# Patient Record
Sex: Female | Born: 1960
Health system: Southern US, Community
[De-identification: ages and names within clinical notes are randomized; demographics above are authoritative.]

## PROBLEM LIST (undated history)

## (undated) DIAGNOSIS — E785 Hyperlipidemia, unspecified: Secondary | ICD-10-CM

## (undated) DIAGNOSIS — K649 Unspecified hemorrhoids: Secondary | ICD-10-CM

## (undated) DIAGNOSIS — M719 Bursopathy, unspecified: Secondary | ICD-10-CM

## (undated) DIAGNOSIS — B977 Papillomavirus as the cause of diseases classified elsewhere: Secondary | ICD-10-CM

## (undated) DIAGNOSIS — K219 Gastro-esophageal reflux disease without esophagitis: Secondary | ICD-10-CM

## (undated) DIAGNOSIS — I10 Essential (primary) hypertension: Secondary | ICD-10-CM

## (undated) DIAGNOSIS — M1990 Unspecified osteoarthritis, unspecified site: Secondary | ICD-10-CM

## (undated) HISTORY — DX: Hyperlipidemia, unspecified: E78.5

## (undated) HISTORY — DX: Bursopathy, unspecified: M71.9

## (undated) HISTORY — PX: TONSILLECTOMY: SUR1361

## (undated) HISTORY — DX: Gastro-esophageal reflux disease without esophagitis: K21.9

## (undated) HISTORY — DX: Papillomavirus as the cause of diseases classified elsewhere: B97.7

## (undated) HISTORY — DX: Unspecified hemorrhoids: K64.9

## (undated) HISTORY — PX: TUBAL LIGATION: SHX77

## (undated) HISTORY — DX: Essential (primary) hypertension: I10

---

## 2006-03-18 ENCOUNTER — Ambulatory Visit: Payer: Self-pay | Admitting: Unknown Physician Specialty

## 2006-12-03 ENCOUNTER — Ambulatory Visit: Payer: Self-pay | Admitting: Unknown Physician Specialty

## 2008-03-22 ENCOUNTER — Ambulatory Visit: Payer: Self-pay | Admitting: Unknown Physician Specialty

## 2009-03-29 ENCOUNTER — Ambulatory Visit: Payer: Self-pay | Admitting: Unknown Physician Specialty

## 2009-11-24 DIAGNOSIS — K649 Unspecified hemorrhoids: Secondary | ICD-10-CM

## 2009-11-24 HISTORY — PX: COLONOSCOPY: SHX174

## 2009-11-24 HISTORY — PX: OTHER SURGICAL HISTORY: SHX169

## 2009-11-24 HISTORY — DX: Unspecified hemorrhoids: K64.9

## 2010-04-01 ENCOUNTER — Ambulatory Visit: Payer: Self-pay | Admitting: Unknown Physician Specialty

## 2011-04-03 ENCOUNTER — Ambulatory Visit: Payer: Self-pay | Admitting: Unknown Physician Specialty

## 2012-04-01 ENCOUNTER — Ambulatory Visit: Payer: Self-pay | Admitting: Obstetrics and Gynecology

## 2013-07-21 ENCOUNTER — Ambulatory Visit (INDEPENDENT_AMBULATORY_CARE_PROVIDER_SITE_OTHER): Payer: BC Managed Care – PPO | Admitting: General Surgery

## 2013-07-21 ENCOUNTER — Encounter: Payer: Self-pay | Admitting: General Surgery

## 2013-07-21 VITALS — BP 140/82 | HR 76 | Resp 14 | Ht 65.0 in | Wt 254.0 lb

## 2013-07-21 DIAGNOSIS — K603 Anal fistula, unspecified: Secondary | ICD-10-CM

## 2013-07-21 DIAGNOSIS — K6289 Other specified diseases of anus and rectum: Secondary | ICD-10-CM

## 2013-07-21 HISTORY — DX: Anal fistula, unspecified: K60.30

## 2013-07-21 HISTORY — DX: Anal fistula: K60.3

## 2013-07-21 NOTE — Patient Instructions (Addendum)
Patient to be schedule for surgery.  Patient's surgery has been scheduled for 07-29-13 at [facility].

## 2013-07-21 NOTE — Progress Notes (Signed)
Patient ID: Toni Kelly, female   DOB: 12-26-1960, 52 y.o.   MRN: [id number]  Chief Complaint  Patient presents with  . Other    anal fistula     HPI Toni Kelly is a 52 y.o. female here today for an evaluation of anal fistula .Patient states been there for about six months. She states she is having a lot of pain in her rectal area. Patient had her hemorrhoids banding three years ago. HPI  Past Medical History  Diagnosis Date  . Hyperlipidemia   . Hemorrhoids 2011    Past Surgical History  Procedure Laterality Date  . Tonsillectomy    . Abdominal hysterectomy    . Hemorrhoids banding  2011  . Colonoscopy      History reviewed. No pertinent family history.  Social History History  Substance Use Topics  . Smoking status: Never Smoker   . Smokeless tobacco: Never Used  . Alcohol Use: Yes    Allergies  Allergen Reactions  . Codeine Itching  . Ultram [Tramadol] Itching    Current Outpatient Prescriptions  Medication Sig Dispense Refill  . hydrochlorothiazide (HYDRODIURIL) 25 MG tablet Take 25 mg by mouth daily.       Marland Kitchen ibuprofen (ADVIL,MOTRIN) 800 MG tablet Take by mouth every 6 (six) hours as needed.       . simvastatin (ZOCOR) 20 MG tablet Take 20 mg by mouth daily.        No current facility-administered medications for this visit.    Review of Systems Review of Systems  Constitutional: Negative.   Respiratory: Negative.   Cardiovascular: Negative.     Blood pressure 140/82, pulse 76, resp. rate 14, height 5\' 5"  (1.651 m), weight 254 lb (115.214 kg).  Physical Exam Physical Exam  Constitutional: She is oriented to person, place, and time. She appears well-developed and well-nourished.  Eyes: Conjunctivae are normal. No scleral icterus.  Neck: Neck supple. Mass present. No thyromegaly present.  Cardiovascular: Normal rate, regular rhythm and normal heart sounds.   Pulmonary/Chest: Breath sounds normal.  Abdominal: Soft. Normal appearance and bowel sounds  are normal. No hernia.  Genitourinary:   Opening at 2 o'clock perianal area with a ridge palpable  going up to the anal canal. Skin tag anterior   1.5cm and fibrotic   Lymphadenopathy:    She has no cervical adenopathy.  Neurological: She is alert and oriented to person, place, and time.  Skin: Skin is warm and dry.    Data Reviewed None'   Assessment     Anal fistula. Possible anal fissure anterior.  Plan   Recommend exam under anesthesia, anal fistulotomy or closure with fibrin product, possible fissuere treatment. She is agreeable.    Patient's surgery has been scheduled for 07-29-13 at [facility].    SANKAR,SEEPLAPUTHUR G 07/22/2013, 8:19 AM

## 2013-07-22 ENCOUNTER — Encounter: Payer: Self-pay | Admitting: General Surgery

## 2013-07-27 ENCOUNTER — Telehealth: Payer: Self-pay | Admitting: *Deleted

## 2013-07-27 NOTE — Telephone Encounter (Signed)
Patient called to cancel surgery that was scheduled for 07-29-13 at [facility]. She states she cannot get off of work. Leah in the O.R. notified of cancellation. Patient instructed to call the office once she is ready to reschedule.

## 2013-12-25 ENCOUNTER — Ambulatory Visit: Payer: Self-pay | Admitting: Internal Medicine

## 2013-12-25 LAB — RAPID STREP-A WITH REFLX: Micro Text Report: NEGATIVE

## 2013-12-27 LAB — BETA STREP CULTURE(ARMC)

## 2014-05-24 ENCOUNTER — Ambulatory Visit: Payer: Self-pay | Admitting: Obstetrics and Gynecology

## 2014-09-25 ENCOUNTER — Encounter: Payer: Self-pay | Admitting: General Surgery

## 2015-05-15 ENCOUNTER — Other Ambulatory Visit: Payer: Self-pay | Admitting: Obstetrics and Gynecology

## 2015-05-15 DIAGNOSIS — Z1231 Encounter for screening mammogram for malignant neoplasm of breast: Secondary | ICD-10-CM

## 2015-05-31 ENCOUNTER — Ambulatory Visit
Admission: RE | Admit: 2015-05-31 | Discharge: 2015-05-31 | Disposition: A | Discharge status: Home / Self Care | Payer: BLUE CROSS/BLUE SHIELD | Source: Ambulatory Visit | Attending: Obstetrics and Gynecology | Admitting: Obstetrics and Gynecology

## 2015-05-31 DIAGNOSIS — Z1231 Encounter for screening mammogram for malignant neoplasm of breast: Secondary | ICD-10-CM | POA: Diagnosis not present

## 2015-09-28 ENCOUNTER — Encounter: Payer: Self-pay | Admitting: Internal Medicine

## 2015-09-28 ENCOUNTER — Other Ambulatory Visit: Payer: Self-pay | Admitting: Internal Medicine

## 2015-09-28 ENCOUNTER — Ambulatory Visit (INDEPENDENT_AMBULATORY_CARE_PROVIDER_SITE_OTHER): Payer: BLUE CROSS/BLUE SHIELD | Admitting: Internal Medicine

## 2015-09-28 VITALS — BP 122/98 | HR 86 | Temp 97.9°F | Ht 64.0 in | Wt 274.0 lb

## 2015-09-28 DIAGNOSIS — R609 Edema, unspecified: Secondary | ICD-10-CM | POA: Insufficient documentation

## 2015-09-28 DIAGNOSIS — H65191 Other acute nonsuppurative otitis media, right ear: Secondary | ICD-10-CM

## 2015-09-28 DIAGNOSIS — E785 Hyperlipidemia, unspecified: Secondary | ICD-10-CM

## 2015-09-28 DIAGNOSIS — R600 Localized edema: Secondary | ICD-10-CM

## 2015-09-28 DIAGNOSIS — E782 Mixed hyperlipidemia: Secondary | ICD-10-CM | POA: Insufficient documentation

## 2015-09-28 MED ORDER — AZITHROMYCIN 250 MG PO TABS: ORAL_TABLET | ORAL | Status: DC

## 2015-09-28 NOTE — Progress Notes (Signed)
Date:  09/28/2015   Name:  Toni Kelly   DOB:  Oct 18, 1961   MRN:  [id number]   Chief Complaint: Ear Pain Otalgia  There is pain in the right ear. This is a new problem. The current episode started yesterday. The problem occurs constantly. The problem has been unchanged. There has been no fever. The pain is mild. Pertinent negatives include no coughing, ear discharge, headaches, hearing loss, rash, rhinorrhea or sore throat. She has tried ear drops for the symptoms. The treatment provided no relief.    Review of Systems  Constitutional: Negative for fever and fatigue.  HENT: Positive for ear pain. Negative for ear discharge, hearing loss, postnasal drip, rhinorrhea, sinus pressure and sore throat.   Respiratory: Negative for cough and wheezing.   Cardiovascular: Negative for chest pain and palpitations.  Skin: Negative for rash.  Neurological: Negative for headaches.  Hematological: Negative for adenopathy.    Patient Active Problem List   Diagnosis Date Noted  . Hyperlipidemia 09/28/2015  . Edema 09/28/2015  . Anal fistula 07/21/2013    Prior to Admission medications   Medication Sig Start Date End Date Taking? Authorizing Provider  FLUARIX QUADRIVALENT 0.5 ML injection TO BE ADMINISTERED BY PHARMACIST FOR IMMUNIZATION 09/09/15  Yes Historical Provider, MD  hydrochlorothiazide (HYDRODIURIL) 25 MG tablet Take 25 mg by mouth daily.  06/29/13  Yes Historical Provider, MD  simvastatin (ZOCOR) 20 MG tablet Take 20 mg by mouth daily.  06/29/13  Yes Historical Provider, MD  ibuprofen (ADVIL,MOTRIN) 800 MG tablet Take by mouth every 6 (six) hours as needed.  04/14/13   Historical Provider, MD    Allergies  Allergen Reactions  . Codeine Itching  . Ultram [Tramadol] Itching    Past Surgical History  Procedure Laterality Date  . Tonsillectomy    . Abdominal hysterectomy    . Hemorrhoids banding  2011  . Colonoscopy  2011    @ [facility]    Social History  Substance Use Topics  .  Smoking status: Never Smoker   . Smokeless tobacco: Never Used  . Alcohol Use: Yes    Medication list has been reviewed and updated.   Physical Exam  Constitutional: She appears well-developed and well-nourished.  HENT:  Right Ear: No drainage. Tympanic membrane is retracted (drum dull). Tympanic membrane is not erythematous. No middle ear effusion.  Left Ear: Tympanic membrane is not erythematous and not retracted.  Nose: Right sinus exhibits no maxillary sinus tenderness and no frontal sinus tenderness. Left sinus exhibits no maxillary sinus tenderness and no frontal sinus tenderness.  Mouth/Throat: Oropharynx is clear and moist. No posterior oropharyngeal erythema.  Cardiovascular: Normal rate, regular rhythm and normal heart sounds.   Pulmonary/Chest: Effort normal and breath sounds normal.  Nursing note and vitals reviewed.   BP 122/98 mmHg  Pulse 86  Temp(Src) 97.9 F (36.6 C) (Oral)  Ht 5\' 4"  (1.626 m)  Wt 274 lb (124.286 kg)  BMI 47.01 kg/m2  Assessment and Plan: 1. Other acute nonsuppurative otitis media of right ear Use Sudafed 30 mg twice a day for congestion. Tylenol or ibuprofen for pain - azithromycin (ZITHROMAX Z-PAK) 250 MG tablet; 2 tabs one day #1 then 1 tab daily for 4 more days.  Dispense: 6 each; Refill: 0   Halina Maidens, MD [facility]  09/28/2015

## 2015-09-28 NOTE — Patient Instructions (Signed)
Sudafed 30 mg - take one in the morning and one at mid - day.

## 2015-10-31 ENCOUNTER — Ambulatory Visit (INDEPENDENT_AMBULATORY_CARE_PROVIDER_SITE_OTHER): Payer: BLUE CROSS/BLUE SHIELD | Admitting: Internal Medicine

## 2015-10-31 ENCOUNTER — Encounter: Payer: Self-pay | Admitting: Internal Medicine

## 2015-10-31 VITALS — BP 110/60 | HR 76 | Ht 64.0 in | Wt 272.4 lb

## 2015-10-31 DIAGNOSIS — E785 Hyperlipidemia, unspecified: Secondary | ICD-10-CM | POA: Diagnosis not present

## 2015-10-31 DIAGNOSIS — M778 Other enthesopathies, not elsewhere classified: Secondary | ICD-10-CM | POA: Insufficient documentation

## 2015-10-31 DIAGNOSIS — R600 Localized edema: Secondary | ICD-10-CM | POA: Diagnosis not present

## 2015-10-31 DIAGNOSIS — M659 Synovitis and tenosynovitis, unspecified: Secondary | ICD-10-CM

## 2015-10-31 HISTORY — DX: Other enthesopathies, not elsewhere classified: M77.8

## 2015-10-31 MED ORDER — SIMVASTATIN 20 MG PO TABS: 20.0000 mg | ORAL_TABLET | Freq: Every day | ORAL | Status: DC

## 2015-10-31 MED ORDER — HYDROCHLOROTHIAZIDE 25 MG PO TABS: 25.0000 mg | ORAL_TABLET | Freq: Every day | ORAL | Status: DC

## 2015-10-31 NOTE — Progress Notes (Signed)
Date:  10/31/2015   Name:  Toni Kelly   DOB:  [birth year]   MRN:  [id number]   Chief Complaint: Hypertension and Hyperlipidemia Hyperlipidemia This is a chronic problem. The current episode started more than 1 year ago. The problem is controlled. Recent lipid tests were reviewed and are normal. Pertinent negatives include no myalgias. The current treatment provides significant improvement of lipids.  Elbow pain - has a known bone spur on the right elbow.  She gets cortisone injections about once a year.  She takes ibuprofen as needed. Edema - mild LE edema well controlled with a diuretic daily.  She tries to follow a low salt diet.  Review of Systems  Constitutional: Negative for fever, chills and fatigue.  HENT: Negative for hearing loss.   Eyes: Negative for visual disturbance.  Respiratory: Negative for chest tightness and wheezing.   Cardiovascular: Negative for leg swelling.  Genitourinary: Negative for urgency and flank pain.  Musculoskeletal: Positive for arthralgias. Negative for myalgias and gait problem.  Skin: Negative for rash.  Neurological: Negative for dizziness.    Patient Active Problem List   Diagnosis Date Noted  . Hyperlipidemia 09/28/2015  . Edema 09/28/2015  . Anal fistula 07/21/2013    Prior to Admission medications   Medication Sig Start Date End Date Taking? Authorizing Provider  hydrochlorothiazide (HYDRODIURIL) 25 MG tablet Take 25 mg by mouth daily.  06/29/13  Yes Historical Provider, MD  ibuprofen (ADVIL,MOTRIN) 800 MG tablet Take by mouth every 6 (six) hours as needed.  04/14/13  Yes Historical Provider, MD  simvastatin (ZOCOR) 20 MG tablet Take 20 mg by mouth daily.  06/29/13  Yes Historical Provider, MD    Allergies  Allergen Reactions  . Codeine Itching  . Ultram [Tramadol] Itching    Past Surgical History  Procedure Laterality Date  . Tonsillectomy    . Abdominal hysterectomy    . Hemorrhoids banding  2011  . Colonoscopy  2011    @ [facility]     Social History  Substance Use Topics  . Smoking status: Never Smoker   . Smokeless tobacco: Never Used  . Alcohol Use: 0.0 oz/week    0 Standard drinks or equivalent per week    Medication list has been reviewed and updated.   Physical Exam  Constitutional: She is oriented to person, place, and time. She appears well-developed and well-nourished.  Neck: Normal range of motion. Carotid bruit is not present.  Cardiovascular: Normal rate, regular rhythm and normal heart sounds.   Pulmonary/Chest: Effort normal and breath sounds normal.  Abdominal: Soft. Normal appearance and bowel sounds are normal. There is no tenderness. There is no rebound.  Musculoskeletal:       Right elbow: She exhibits no swelling and no effusion. Tenderness found.  Neurological: She is alert and oriented to person, place, and time.  Skin: Skin is warm, dry and intact.  Psychiatric: She has a normal mood and affect. Her speech is normal.    BP 110/60 mmHg  Pulse 76  Ht 5\' 4"  (1.626 m)  Wt 272 lb 6.4 oz (123.56 kg)  BMI 46.73 kg/m2  Assessment and Plan: 1. Localized edema Controlled on hydrochlorothiazide - hydrochlorothiazide (HYDRODIURIL) 25 MG tablet; Take 1 tablet (25 mg total) by mouth daily.  Dispense: 30 tablet; Refill: 12 - Comprehensive metabolic panel - CBC with Differential/Platelet - TSH  2. Hyperlipidemia Continue statin therapy;  will check labs - simvastatin (ZOCOR) 20 MG tablet; Take 1 tablet (20 mg total) by mouth  daily.  Dispense: 30 tablet; Refill: 12 - Lipid panel  3. Elbow tendonitis Followed by orthopedics with periodic cortisone injections   Halina Maidens, MD [facility]  10/31/2015

## 2015-11-01 LAB — COMPREHENSIVE METABOLIC PANEL
ALT: 33 IU/L — ABNORMAL HIGH (ref 0–32)
AST: 30 IU/L (ref 0–40)
Albumin/Globulin Ratio: 1.4 (ref 1.1–2.5)
Albumin: 4.2 g/dL (ref 3.5–5.5)
Alkaline Phosphatase: 80 IU/L (ref 39–117)
BUN/Creatinine Ratio: 15 (ref 9–23)
BUN: 14 mg/dL (ref 6–24)
Bilirubin Total: 0.5 mg/dL (ref 0.0–1.2)
CO2: 23 mmol/L (ref 18–29)
Calcium: 9.5 mg/dL (ref 8.7–10.2)
Chloride: 105 mmol/L (ref 97–106)
Creatinine, Ser: 0.93 mg/dL (ref 0.57–1.00)
GFR calc Af Amer: 80 mL/min/{1.73_m2} (ref >59)
GFR calc non Af Amer: 69 mL/min/{1.73_m2} (ref >59)
Globulin, Total: 2.9 g/dL (ref 1.5–4.5)
Glucose: 90 mg/dL (ref 65–99)
Potassium: 4.6 mmol/L (ref 3.5–5.2)
Sodium: 145 mmol/L — ABNORMAL HIGH (ref 136–144)
Total Protein: 7.1 g/dL (ref 6.0–8.5)

## 2015-11-01 LAB — LIPID PANEL
Chol/HDL Ratio: 2.9 ratio units (ref 0.0–4.4)
Cholesterol, Total: 191 mg/dL (ref 100–199)
HDL: 65 mg/dL (ref >39)
LDL Calculated: 109 mg/dL — ABNORMAL HIGH (ref 0–99)
Triglycerides: 84 mg/dL (ref 0–149)
VLDL Cholesterol Cal: 17 mg/dL (ref 5–40)

## 2015-11-01 LAB — CBC WITH DIFFERENTIAL/PLATELET
Basophils Absolute: 0 10*3/uL (ref 0.0–0.2)
Basos: 1 %
EOS (ABSOLUTE): 0.1 10*3/uL (ref 0.0–0.4)
Eos: 2 %
Hematocrit: 42.8 % (ref 34.0–46.6)
Hemoglobin: 14.1 g/dL (ref 11.1–15.9)
Immature Grans (Abs): 0 10*3/uL (ref 0.0–0.1)
Immature Granulocytes: 0 %
Lymphocytes Absolute: 1.9 10*3/uL (ref 0.7–3.1)
Lymphs: 32 %
MCH: 29.5 pg (ref 26.6–33.0)
MCHC: 32.9 g/dL (ref 31.5–35.7)
MCV: 90 fL (ref 79–97)
Monocytes Absolute: 0.5 10*3/uL (ref 0.1–0.9)
Monocytes: 9 %
Neutrophils Absolute: 3.3 10*3/uL (ref 1.4–7.0)
Neutrophils: 56 %
Platelets: 266 10*3/uL (ref 150–379)
RBC: 4.78 x10E6/uL (ref 3.77–5.28)
RDW: 14.4 % (ref 12.3–15.4)
WBC: 5.9 10*3/uL (ref 3.4–10.8)

## 2015-11-01 LAB — TSH: TSH: 1.26 u[IU]/mL (ref 0.450–4.500)

## 2016-03-31 ENCOUNTER — Other Ambulatory Visit: Payer: Self-pay | Admitting: Internal Medicine

## 2016-03-31 DIAGNOSIS — R600 Localized edema: Secondary | ICD-10-CM

## 2016-03-31 DIAGNOSIS — E785 Hyperlipidemia, unspecified: Secondary | ICD-10-CM

## 2016-03-31 MED ORDER — HYDROCHLOROTHIAZIDE 25 MG PO TABS: 25.0000 mg | ORAL_TABLET | Freq: Every day | ORAL | Status: DC

## 2016-03-31 MED ORDER — SIMVASTATIN 20 MG PO TABS: 20.0000 mg | ORAL_TABLET | Freq: Every day | ORAL | Status: DC

## 2016-04-22 ENCOUNTER — Other Ambulatory Visit: Payer: Self-pay | Admitting: Obstetrics and Gynecology

## 2016-04-22 DIAGNOSIS — Z1231 Encounter for screening mammogram for malignant neoplasm of breast: Secondary | ICD-10-CM

## 2016-05-23 DIAGNOSIS — Z01419 Encounter for gynecological examination (general) (routine) without abnormal findings: Secondary | ICD-10-CM | POA: Diagnosis not present

## 2016-05-23 DIAGNOSIS — R87613 High grade squamous intraepithelial lesion on cytologic smear of cervix (HGSIL): Secondary | ICD-10-CM

## 2016-05-23 DIAGNOSIS — R87612 Low grade squamous intraepithelial lesion on cytologic smear of cervix (LGSIL): Secondary | ICD-10-CM | POA: Diagnosis not present

## 2016-05-23 HISTORY — DX: High grade squamous intraepithelial lesion on cytologic smear of cervix (HGSIL): R87.613

## 2016-06-02 ENCOUNTER — Ambulatory Visit
Admission: RE | Admit: 2016-06-02 | Discharge: 2016-06-02 | Disposition: A | Discharge status: Home / Self Care | Payer: BLUE CROSS/BLUE SHIELD | Source: Ambulatory Visit | Attending: Obstetrics and Gynecology | Admitting: Obstetrics and Gynecology

## 2016-06-02 DIAGNOSIS — Z1231 Encounter for screening mammogram for malignant neoplasm of breast: Secondary | ICD-10-CM

## 2016-06-13 DIAGNOSIS — N72 Inflammatory disease of cervix uteri: Secondary | ICD-10-CM | POA: Diagnosis not present

## 2016-06-13 DIAGNOSIS — R87810 Cervical high risk human papillomavirus (HPV) DNA test positive: Secondary | ICD-10-CM | POA: Diagnosis not present

## 2016-06-13 DIAGNOSIS — R87612 Low grade squamous intraepithelial lesion on cytologic smear of cervix (LGSIL): Secondary | ICD-10-CM | POA: Diagnosis not present

## 2016-07-22 DIAGNOSIS — H40003 Preglaucoma, unspecified, bilateral: Secondary | ICD-10-CM | POA: Diagnosis not present

## 2016-08-16 DIAGNOSIS — Z23 Encounter for immunization: Secondary | ICD-10-CM | POA: Diagnosis not present

## 2016-09-17 ENCOUNTER — Ambulatory Visit: Payer: BLUE CROSS/BLUE SHIELD | Admitting: Internal Medicine

## 2016-09-17 ENCOUNTER — Encounter: Payer: Self-pay | Admitting: Internal Medicine

## 2016-09-17 ENCOUNTER — Ambulatory Visit (INDEPENDENT_AMBULATORY_CARE_PROVIDER_SITE_OTHER): Payer: BLUE CROSS/BLUE SHIELD | Admitting: Internal Medicine

## 2016-09-17 VITALS — BP 124/86 | HR 84 | Temp 98.6°F | Resp 16 | Ht 64.0 in | Wt 276.0 lb

## 2016-09-17 DIAGNOSIS — J0100 Acute maxillary sinusitis, unspecified: Secondary | ICD-10-CM | POA: Diagnosis not present

## 2016-09-17 MED ORDER — AZITHROMYCIN 250 MG PO TABS: ORAL_TABLET | ORAL | 0 refills | Status: DC

## 2016-09-17 NOTE — Patient Instructions (Signed)

## 2016-09-17 NOTE — Progress Notes (Addendum)
Date:  09/17/2016   Name:  Toni Kelly   DOB:  22-Jul-1961   MRN:  [id number]   Chief Complaint: Sinus Problem (1 week-Some ST and headache that just will not go away.) Sinus Problem  This is a new problem. The current episode started in the past 7 days. The problem is unchanged. There has been no fever. Associated symptoms include congestion, headaches, sinus pressure and a sore throat. Pertinent negatives include no chills, coughing, ear pain or shortness of breath. Treatments tried: otc cold medication. The treatment provided mild relief.      Review of Systems  Constitutional: Negative for chills, fatigue and fever.  HENT: Positive for congestion, sinus pressure and sore throat. Negative for ear discharge, ear pain and trouble swallowing.   Eyes: Negative for visual disturbance.  Respiratory: Negative for cough, chest tightness and shortness of breath.   Cardiovascular: Negative for chest pain.  Neurological: Positive for headaches. Negative for dizziness.  Hematological: Negative for adenopathy.    Patient Active Problem List   Diagnosis Date Noted  . Localized edema 10/31/2015  . Elbow tendonitis 10/31/2015  . Hyperlipidemia 09/28/2015  . Anal fistula 07/21/2013    Prior to Admission medications   Medication Sig Start Date End Date Taking? Authorizing Provider  hydrochlorothiazide (HYDRODIURIL) 25 MG tablet Take 1 tablet (25 mg total) by mouth daily. 03/31/16  Yes Glean Hess, MD  ibuprofen (ADVIL,MOTRIN) 800 MG tablet Take by mouth every 6 (six) hours as needed.  04/14/13  Yes Historical Provider, MD  simvastatin (ZOCOR) 20 MG tablet Take 1 tablet (20 mg total) by mouth daily. 03/31/16  Yes Glean Hess, MD  spironolactone (ALDACTONE) 50 MG tablet TAKE 1 TABLET EVERY DAY 03/31/16  Yes Glean Hess, MD    Allergies  Allergen Reactions  . Codeine Itching  . Ultram [Tramadol] Itching    Past Surgical History:  Procedure Laterality Date  . ABDOMINAL  HYSTERECTOMY    . COLONOSCOPY  2011   @ [facility]  . hemorrhoids banding  2011  . TONSILLECTOMY      Social History  Substance Use Topics  . Smoking status: Never Smoker  . Smokeless tobacco: Never Used  . Alcohol use 0.0 oz/week     Medication list has been reviewed and updated.   Physical Exam  Constitutional: She is oriented to person, place, and time. She appears well-developed and well-nourished.  HENT:  Right Ear: External ear normal.  Left Ear: External ear normal.  Nose: Right sinus exhibits maxillary sinus tenderness and frontal sinus tenderness. Left sinus exhibits maxillary sinus tenderness and frontal sinus tenderness.  Mouth/Throat: Uvula is midline and mucous membranes are normal. No oral lesions. Posterior oropharyngeal erythema present. No oropharyngeal exudate.  Obscured by cerumen - the small portion of the TMs visualized appeared normal.  Cardiovascular: Normal rate, regular rhythm and normal heart sounds.   Pulmonary/Chest: Breath sounds normal. She has no wheezes. She has no rales.  Lymphadenopathy:    She has no cervical adenopathy.  Neurological: She is alert and oriented to person, place, and time.    BP 124/86   Pulse 84   Temp 98.6 F (37 C)   Resp 16   Ht 5\' 4"  (1.626 m)   Wt 276 lb (125.2 kg)   SpO2 98%   BMI 47.38 kg/m   Assessment and Plan: 1. Acute non-recurrent maxillary sinusitis Continue otc cold and cough medication as needed - azithromycin (ZITHROMAX Z-PAK) 250 MG tablet; 2 tabs  on day #1 then 1 tab daily for 4 days.  Dispense: 6 each; Refill: 0   Toni Maidens, MD [facility]  09/17/2016

## 2016-12-30 ENCOUNTER — Encounter: Payer: Self-pay | Admitting: Internal Medicine

## 2016-12-30 ENCOUNTER — Ambulatory Visit (INDEPENDENT_AMBULATORY_CARE_PROVIDER_SITE_OTHER): Payer: BLUE CROSS/BLUE SHIELD | Admitting: Internal Medicine

## 2016-12-30 VITALS — BP 122/92 | HR 93 | Temp 98.2°F | Wt 273.0 lb

## 2016-12-30 DIAGNOSIS — J40 Bronchitis, not specified as acute or chronic: Secondary | ICD-10-CM

## 2016-12-30 MED ORDER — CEFDINIR 300 MG PO CAPS: 300.0000 mg | ORAL_CAPSULE | Freq: Two times a day (BID) | ORAL | 0 refills | Status: DC

## 2016-12-30 MED ORDER — BENZONATATE 100 MG PO CAPS: 100.0000 mg | ORAL_CAPSULE | Freq: Three times a day (TID) | ORAL | 0 refills | Status: DC

## 2016-12-30 NOTE — Progress Notes (Signed)
Date:  12/30/2016   Name:  Toni Kelly   DOB:  02-17-1961   MRN:  [id number]   Chief Complaint: Cough (Pt stated coughing with green mucus, headache, chills this morning...) Cough  This is a new problem. The current episode started 1 to 4 weeks ago. The problem has been gradually worsening. The problem occurs every few minutes. The cough is productive of sputum. Associated symptoms include chest pain, chills and ear congestion. Pertinent negatives include no fever, headaches or wheezing.      Review of Systems  Constitutional: Positive for chills. Negative for fever.  Eyes: Negative for visual disturbance.  Respiratory: Positive for cough. Negative for wheezing.   Cardiovascular: Positive for chest pain.  Neurological: Positive for dizziness and tremors. Negative for numbness and headaches.    Patient Active Problem List   Diagnosis Date Noted  . Localized edema 10/31/2015  . Elbow tendonitis 10/31/2015  . Hyperlipidemia 09/28/2015  . Anal fistula 07/21/2013    Prior to Admission medications   Medication Sig Start Date End Date Taking? Authorizing Provider  hydrochlorothiazide (HYDRODIURIL) 25 MG tablet Take 1 tablet (25 mg total) by mouth daily. 03/31/16  Yes Glean Hess, MD  ibuprofen (ADVIL,MOTRIN) 800 MG tablet Take by mouth every 6 (six) hours as needed.  04/14/13  Yes Historical Provider, MD  simvastatin (ZOCOR) 20 MG tablet Take 1 tablet (20 mg total) by mouth daily. 03/31/16  Yes Glean Hess, MD  spironolactone (ALDACTONE) 50 MG tablet TAKE 1 TABLET EVERY DAY 03/31/16  Yes Glean Hess, MD    Allergies  Allergen Reactions  . Codeine Itching  . Ultram [Tramadol] Itching    Past Surgical History:  Procedure Laterality Date  . ABDOMINAL HYSTERECTOMY    . COLONOSCOPY  2011   @ [facility]  . hemorrhoids banding  2011  . TONSILLECTOMY      Social History  Substance Use Topics  . Smoking status: Never Smoker  . Smokeless tobacco: Never Used  . Alcohol  use 0.0 oz/week     Medication list has been reviewed and updated.   Physical Exam  Constitutional: She is oriented to person, place, and time. She appears well-developed. No distress.  HENT:  Head: Normocephalic and atraumatic.  Right Ear: Tympanic membrane and ear canal normal.  Left Ear: Tympanic membrane and ear canal normal.  Nose: Right sinus exhibits no maxillary sinus tenderness. Left sinus exhibits no maxillary sinus tenderness.  Mouth/Throat: Oropharynx is clear and moist.  Cardiovascular: Normal rate, regular rhythm and normal heart sounds.   Pulmonary/Chest: Effort normal. No respiratory distress. She has decreased breath sounds. She has wheezes. She has no rhonchi.  Musculoskeletal: Normal range of motion.  Neurological: She is alert and oriented to person, place, and time.  Skin: Skin is warm and dry. No rash noted.  Psychiatric: She has a normal mood and affect. Her behavior is normal. Thought content normal.  Nursing note and vitals reviewed.   BP (!) 122/92   Pulse 93   Temp 98.2 F (36.8 C)   Wt 273 lb (123.8 kg)   SpO2 94%   BMI 46.86 kg/m   Assessment and Plan: 1. Bronchitis - cefdinir (OMNICEF) 300 MG capsule; Take 1 capsule (300 mg total) by mouth 2 (two) times daily.  Dispense: 20 capsule; Refill: 0 - benzonatate (TESSALON) 100 MG capsule; Take 1 capsule (100 mg total) by mouth 3 (three) times daily.  Dispense: 20 capsule; Refill: 0   Halina Maidens, MD  [facility]  12/30/2016

## 2017-01-16 ENCOUNTER — Telehealth: Payer: Self-pay | Admitting: Internal Medicine

## 2017-01-16 ENCOUNTER — Other Ambulatory Visit: Payer: Self-pay | Admitting: Internal Medicine

## 2017-01-16 MED ORDER — AZITHROMYCIN 250 MG PO TABS: ORAL_TABLET | ORAL | 0 refills | Status: DC

## 2017-01-16 NOTE — Telephone Encounter (Signed)
Z pak sent to pharmacy

## 2017-01-16 NOTE — Telephone Encounter (Signed)
Pt called said she has finished all her meds and still has a cough and coughing up green mucus, pt is asking if we could call something else in for her pt is using [facility] in [place]

## 2017-01-23 DIAGNOSIS — H40003 Preglaucoma, unspecified, bilateral: Secondary | ICD-10-CM | POA: Diagnosis not present

## 2017-02-10 ENCOUNTER — Telehealth: Payer: Self-pay

## 2017-02-10 ENCOUNTER — Ambulatory Visit
Admission: RE | Admit: 2017-02-10 | Discharge: 2017-02-10 | Disposition: A | Discharge status: Home / Self Care | Payer: BLUE CROSS/BLUE SHIELD | Source: Ambulatory Visit | Attending: Internal Medicine | Admitting: Internal Medicine

## 2017-02-10 ENCOUNTER — Encounter: Payer: Self-pay | Admitting: Internal Medicine

## 2017-02-10 ENCOUNTER — Ambulatory Visit (INDEPENDENT_AMBULATORY_CARE_PROVIDER_SITE_OTHER): Payer: BLUE CROSS/BLUE SHIELD | Admitting: Internal Medicine

## 2017-02-10 VITALS — BP 124/68 | HR 78 | Temp 98.1°F

## 2017-02-10 DIAGNOSIS — R05 Cough: Secondary | ICD-10-CM | POA: Insufficient documentation

## 2017-02-10 DIAGNOSIS — R059 Cough, unspecified: Secondary | ICD-10-CM

## 2017-02-10 MED ORDER — LEVOFLOXACIN 500 MG PO TABS: 500.0000 mg | ORAL_TABLET | Freq: Every day | ORAL | 0 refills | Status: DC

## 2017-02-10 NOTE — Progress Notes (Signed)
Date:  02/10/2017   Name:  Toni Kelly   DOB:  1960-11-28   MRN:  [id number]   Chief Complaint: Cough (Cough still not going away. Was dx with bronchitis but after 2 rounds of antibiotics symptoms still not gone.) Cough  This is a chronic problem. The current episode started more than 1 month ago. The problem has been waxing and waning. The cough is productive of sputum. Associated symptoms include shortness of breath. Pertinent negatives include no chest pain, chills, fever, headaches, sore throat or wheezing. There is no history of environmental allergies.      Review of Systems  Constitutional: Negative for chills, fatigue, fever and unexpected weight change.  HENT: Negative for congestion, sinus pain, sinus pressure and sore throat.   Eyes: Negative for visual disturbance.  Respiratory: Positive for cough, chest tightness and shortness of breath. Negative for wheezing.   Cardiovascular: Negative for chest pain and palpitations.  Allergic/Immunologic: Negative for environmental allergies.  Neurological: Negative for dizziness and headaches.    Patient Active Problem List   Diagnosis Date Noted  . Localized edema 10/31/2015  . Elbow tendonitis 10/31/2015  . Hyperlipidemia 09/28/2015  . Anal fistula 07/21/2013    Prior to Admission medications   Medication Sig Start Date End Date Taking? Authorizing Provider  hydrochlorothiazide (HYDRODIURIL) 25 MG tablet Take 1 tablet (25 mg total) by mouth daily. 03/31/16  Yes Glean Hess, MD  ibuprofen (ADVIL,MOTRIN) 800 MG tablet Take by mouth every 6 (six) hours as needed.  04/14/13  Yes Historical Provider, MD  simvastatin (ZOCOR) 20 MG tablet Take 1 tablet (20 mg total) by mouth daily. 03/31/16  Yes Glean Hess, MD  spironolactone (ALDACTONE) 50 MG tablet TAKE 1 TABLET EVERY DAY 03/31/16  Yes Glean Hess, MD    Allergies  Allergen Reactions  . Codeine Itching  . Ultram [Tramadol] Itching    Past Surgical History:    Procedure Laterality Date  . ABDOMINAL HYSTERECTOMY    . COLONOSCOPY  2011   @ [facility]  . hemorrhoids banding  2011  . TONSILLECTOMY      Social History  Substance Use Topics  . Smoking status: Never Smoker  . Smokeless tobacco: Never Used  . Alcohol use 0.0 oz/week     Medication list has been reviewed and updated.   Physical Exam  Constitutional: She is oriented to person, place, and time. She appears well-developed. No distress.  HENT:  Head: Normocephalic and atraumatic.  Neck: Normal range of motion. Neck supple.  Cardiovascular: Normal rate, regular rhythm and normal heart sounds.   Pulmonary/Chest: Effort normal and breath sounds normal. No respiratory distress. She has no wheezes. She has no rales.  Musculoskeletal: Normal range of motion.  Lymphadenopathy:    She has no cervical adenopathy.  Neurological: She is alert and oriented to person, place, and time.  Skin: Skin is warm and dry. No rash noted.  Psychiatric: She has a normal mood and affect. Her behavior is normal. Thought content normal.  Nursing note and vitals reviewed.   BP 124/68 (BP Location: Right Arm, Patient Position: Sitting, Cuff Size: Large)   Pulse 78   SpO2 95%   Assessment and Plan: 1. Cough Rule out interstitial lung disease or atypical infection - CBC with Differential/Platelet - DG Chest 2 View; Future   Meds ordered this encounter  Medications  . levofloxacin (LEVAQUIN) 500 MG tablet    Sig: Take 1 tablet (500 mg total) by mouth daily.  Dispense:  7 tablet    Refill:  0    Halina Maidens, MD [facility]  02/10/2017

## 2017-02-10 NOTE — Telephone Encounter (Signed)
Pt called stating she is still coughing up green/ dark colors. Not getting any better after 2 rounds of antibiotics. Told to call back after lunch to schedule office visit and possible chest XR.

## 2017-02-11 LAB — CBC WITH DIFFERENTIAL/PLATELET
Basophils Absolute: 0 10*3/uL (ref 0.0–0.2)
Basos: 0 %
EOS (ABSOLUTE): 0.3 10*3/uL (ref 0.0–0.4)
Eos: 3 %
Hematocrit: 43.8 % (ref 34.0–46.6)
Hemoglobin: 14 g/dL (ref 11.1–15.9)
Immature Grans (Abs): 0 10*3/uL (ref 0.0–0.1)
Immature Granulocytes: 0 %
Lymphocytes Absolute: 3.5 10*3/uL — ABNORMAL HIGH (ref 0.7–3.1)
Lymphs: 31 %
MCH: 29 pg (ref 26.6–33.0)
MCHC: 32 g/dL (ref 31.5–35.7)
MCV: 91 fL (ref 79–97)
Monocytes Absolute: 0.7 10*3/uL (ref 0.1–0.9)
Monocytes: 7 %
Neutrophils Absolute: 6.7 10*3/uL (ref 1.4–7.0)
Neutrophils: 59 %
Platelets: 301 10*3/uL (ref 150–379)
RBC: 4.82 x10E6/uL (ref 3.77–5.28)
RDW: 14 % (ref 12.3–15.4)
WBC: 11.3 10*3/uL — ABNORMAL HIGH (ref 3.4–10.8)

## 2017-03-12 ENCOUNTER — Other Ambulatory Visit: Payer: Self-pay | Admitting: Obstetrics and Gynecology

## 2017-03-12 DIAGNOSIS — Z1231 Encounter for screening mammogram for malignant neoplasm of breast: Secondary | ICD-10-CM

## 2017-04-21 ENCOUNTER — Other Ambulatory Visit: Payer: Self-pay | Admitting: Internal Medicine

## 2017-04-21 DIAGNOSIS — E785 Hyperlipidemia, unspecified: Secondary | ICD-10-CM

## 2017-05-12 DIAGNOSIS — L700 Acne vulgaris: Secondary | ICD-10-CM | POA: Diagnosis not present

## 2017-05-21 DIAGNOSIS — H43812 Vitreous degeneration, left eye: Secondary | ICD-10-CM | POA: Diagnosis not present

## 2017-05-24 HISTORY — PX: CERVICAL BIOPSY  W/ LOOP ELECTRODE EXCISION: SUR135

## 2017-06-03 ENCOUNTER — Ambulatory Visit
Admission: RE | Admit: 2017-06-03 | Discharge: 2017-06-03 | Disposition: A | Discharge status: Home / Self Care | Payer: BLUE CROSS/BLUE SHIELD | Source: Ambulatory Visit | Attending: Obstetrics and Gynecology | Admitting: Obstetrics and Gynecology

## 2017-06-03 DIAGNOSIS — Z1231 Encounter for screening mammogram for malignant neoplasm of breast: Secondary | ICD-10-CM | POA: Insufficient documentation

## 2017-06-11 ENCOUNTER — Other Ambulatory Visit: Payer: Self-pay | Admitting: Internal Medicine

## 2017-06-11 DIAGNOSIS — R600 Localized edema: Secondary | ICD-10-CM

## 2017-06-15 ENCOUNTER — Encounter: Payer: Self-pay | Admitting: Obstetrics and Gynecology

## 2017-06-15 ENCOUNTER — Ambulatory Visit (INDEPENDENT_AMBULATORY_CARE_PROVIDER_SITE_OTHER): Payer: BLUE CROSS/BLUE SHIELD | Admitting: Obstetrics and Gynecology

## 2017-06-15 DIAGNOSIS — Z1239 Encounter for other screening for malignant neoplasm of breast: Secondary | ICD-10-CM

## 2017-06-15 DIAGNOSIS — Z1231 Encounter for screening mammogram for malignant neoplasm of breast: Secondary | ICD-10-CM | POA: Diagnosis not present

## 2017-06-15 DIAGNOSIS — Z01419 Encounter for gynecological examination (general) (routine) without abnormal findings: Secondary | ICD-10-CM | POA: Diagnosis not present

## 2017-06-15 DIAGNOSIS — R87610 Atypical squamous cells of undetermined significance on cytologic smear of cervix (ASC-US): Secondary | ICD-10-CM | POA: Diagnosis not present

## 2017-06-15 DIAGNOSIS — Z124 Encounter for screening for malignant neoplasm of cervix: Secondary | ICD-10-CM

## 2017-06-15 NOTE — Progress Notes (Signed)
Patient ID: Toni Kelly, female   DOB: 15-Dec-1960, 56 y.o.   MRN: [id number]     Gynecology Annual Exam  PCP: Glean Hess, MD  Chief Complaint:  Chief Complaint  Patient presents with  . Gynecologic Exam    History of Present Illness:Patient is a 56 y.o. G2P2 presents for annual exam. The patient has no complaints today.   LMP: No LMP recorded. Patient has had a hysterectomy. No bleeding   The patient is sexually active. She denies dyspareunia.  The patient does perform self breast exams.  There is no notable family history of breast or ovarian cancer in her family.  The patient wears seatbelts: yes.   The patient has regular exercise: not asked.    The patient denies current symptoms of depression.     Review of Systems: Review of Systems  Constitutional: Negative for chills and fever.  HENT: Negative for congestion.   Respiratory: Negative for cough and shortness of breath.   Cardiovascular: Negative for chest pain and palpitations.  Gastrointestinal: Negative for abdominal pain, constipation, diarrhea, heartburn, nausea and vomiting.  Genitourinary: Negative for dysuria, frequency and urgency.  Skin: Negative for itching and rash.  Neurological: Negative for dizziness and headaches.  Endo/Heme/Allergies: Negative for polydipsia.  Psychiatric/Behavioral: Negative for depression.    Past Medical History:  Past Medical History:  Diagnosis Date  . Hemorrhoids 2011  . Hyperlipidemia   . Hypertension     Past Surgical History:  Past Surgical History:  Procedure Laterality Date  . ABDOMINAL HYSTERECTOMY    . COLONOSCOPY  2011   @ [facility]  . hemorrhoids banding  2011  . TONSILLECTOMY      Gynecologic History:  No LMP recorded. Patient has had a hysterectomy. Last Pap: Results were: 05/23/16 HSIL HPV pos (negative colposcopy)  Last mammogram: 06/03/17 Results were: BI-RAD I Obstetric History: G2P2  Family History:  Family History  Problem Relation Age of  Onset  . Breast cancer Maternal Aunt 66  . Breast cancer Cousin 95  . Heart failure Mother     Social History:  Social History   Social History  . Marital status: Widowed    Spouse name: N/A  . Number of children: N/A  . Years of education: N/A   Occupational History  . Not on file.   Social History Main Topics  . Smoking status: Never Smoker  . Smokeless tobacco: Never Used  . Alcohol use 0.0 oz/week  . Drug use: No  . Sexual activity: Yes   Other Topics Concern  . Not on file   Social History Narrative  . No narrative on file    Allergies:  Allergies  Allergen Reactions  . Codeine Itching  . Ultram [Tramadol] Itching    Medications: Prior to Admission medications   Medication Sig Start Date End Date Taking? Authorizing Provider  hydrochlorothiazide (HYDRODIURIL) 25 MG tablet TAKE 1 TABLET (25 MG TOTAL) BY MOUTH DAILY. 06/11/17  Yes Glean Hess, MD  simvastatin (ZOCOR) 20 MG tablet Take 1 tablet (20 mg total) by mouth daily. 03/31/16  Yes Glean Hess, MD  clindamycin (CLINDAGEL) 1 % gel APPLY TOPICALLY EVERY DAY BEFORE NOON 05/12/17   [provider]  doxycycline (VIBRAMYCIN) 100 MG capsule 1(ONE) CAPSULE(S) ORAL 2(TWO) TIMES A DAY 06/08/17   [provider]  RETIN-A MICRO PUMP 0.08 % GEL Apply every NIGHT AT bedtime AS directed 05/18/17   [provider]    Physical Exam Vitals: Blood pressure 136/90, pulse  82, height 5\' 4"  (1.626 m), weight 269 lb (122 kg).  General: NAD HEENT: normocephalic, anicteric Thyroid: no enlargement, no palpable nodules Pulmonary: No increased work of breathing, CTAB Cardiovascular: RRR, distal pulses 2+ Breast: Breast symmetrical, no tenderness, no palpable nodules or masses, no skin or nipple retraction present, no nipple discharge.  No axillary or supraclavicular lymphadenopathy. Abdomen: NABS, soft, non-tender, non-distended.  Umbilicus without lesions.  No hepatomegaly, splenomegaly or masses  palpable. No evidence of hernia  Genitourinary:  External: Normal external female genitalia.  Normal urethral meatus, normal  Bartholin's and Skene's glands.    Vagina: Normal vaginal mucosa, no evidence of prolapse.    Cervix: Grossly normal in appearance, no bleeding  Uterus: Surgically absent  Adnexa: ovaries non-enlarged, no adnexal masses  Rectal: deferred  Lymphatic: no evidence of inguinal lymphadenopathy Extremities: no edema, erythema, or tenderness Neurologic: Grossly intact Psychiatric: mood appropriate, affect full  Female chaperone present for pelvic and breast  portions of the physical exam    Assessment: 56 y.o. G2P2 routine annual exam  Plan: Problem List Items Addressed This Visit    None    Visit Diagnoses    Screening for malignant neoplasm of cervix       Relevant Orders   PapIG, HPV, rfx 16/18   Breast screening       Relevant Orders   MM DIGITAL SCREENING BILATERAL   Encounter for gynecological examination without abnormal finding       Relevant Orders   PapIG, HPV, rfx 16/18      1) Mammogram - recommend yearly screening mammogram.  Mammogram Is up to date  2) STI screening was not offered  3) ASCCP guidelines and rational discussed.  Patient opts for yearly screening interval  4) Osteoporosis  - per USPTF routine screening DEXA at age 46  5) Routine healthcare maintenance including cholesterol, diabetes screening discussed managed by PCP - last looked at 2016, having health screening at work in August  6) Colonoscopy .  Screening recommended starting at age 10 for average risk individuals, age 25 for individuals deemed at increased risk (including African Americans) and recommended to continue until age 57.  For patient age 41-85 individualized approach is recommended.  Gold standard screening is via colonoscopy, Cologuard screening is an acceptable alternative for patient unwilling or unable to undergo colonoscopy.  "Colorectal cancer screening  for average?risk adults: 2018 guideline update from the American Cancer Society"CA: A Cancer Journal for Clinicians: Apr 22, 2017  - had at age 58 per patient  7) Follow up 1 year for routine annual

## 2017-06-15 NOTE — Patient Instructions (Signed)
Preventive Care 40-64 Years, Female Preventive care refers to lifestyle choices and visits with your health care provider that can promote health and wellness. What does preventive care include?  A yearly physical exam. This is also called an annual well check.  Dental exams once or twice a year.  Routine eye exams. Ask your health care provider how often you should have your eyes checked.  Personal lifestyle choices, including: ? Daily care of your teeth and gums. ? Regular physical activity. ? Eating a healthy diet. ? Avoiding tobacco and drug use. ? Limiting alcohol use. ? Practicing safe sex. ? Taking low-dose aspirin daily starting at age 58. ? Taking vitamin and mineral supplements as recommended by your health care provider. What happens during an annual well check? The services and screenings done by your health care provider during your annual well check will depend on your age, overall health, lifestyle risk factors, and family history of disease. Counseling Your health care provider may ask you questions about your:  Alcohol use.  Tobacco use.  Drug use.  Emotional well-being.  Home and relationship well-being.  Sexual activity.  Eating habits.  Work and work Statistician.  Method of birth control.  Menstrual cycle.  Pregnancy history.  Screening You may have the following tests or measurements:  Height, weight, and BMI.  Blood pressure.  Lipid and cholesterol levels. These may be checked every 5 years, or more frequently if you are over 81 years old.  Skin check.  Lung cancer screening. You may have this screening every year starting at age 78 if you have a 30-pack-year history of smoking and currently smoke or have quit within the past 15 years.  Fecal occult blood test (FOBT) of the stool. You may have this test every year starting at age 65.  Flexible sigmoidoscopy or colonoscopy. You may have a sigmoidoscopy every 5 years or a colonoscopy  every 10 years starting at age 30.  Hepatitis C blood test.  Hepatitis B blood test.  Sexually transmitted disease (STD) testing.  Diabetes screening. This is done by checking your blood sugar (glucose) after you have not eaten for a while (fasting). You may have this done every 1-3 years.  Mammogram. This may be done every 1-2 years. Talk to your health care provider about when you should start having regular mammograms. This may depend on whether you have a family history of breast cancer.  BRCA-related cancer screening. This may be done if you have a family history of breast, ovarian, tubal, or peritoneal cancers.  Pelvic exam and Pap test. This may be done every 3 years starting at age 80. Starting at age 36, this may be done every 5 years if you have a Pap test in combination with an HPV test.  Bone density scan. This is done to screen for osteoporosis. You may have this scan if you are at high risk for osteoporosis.  Discuss your test results, treatment options, and if necessary, the need for more tests with your health care provider. Vaccines Your health care provider may recommend certain vaccines, such as:  Influenza vaccine. This is recommended every year.  Tetanus, diphtheria, and acellular pertussis (Tdap, Td) vaccine. You may need a Td booster every 10 years.  Varicella vaccine. You may need this if you have not been vaccinated.  Zoster vaccine. You may need this after age 5.  Measles, mumps, and rubella (MMR) vaccine. You may need at least one dose of MMR if you were born in  1957 or later. You may also need a second dose.  Pneumococcal 13-valent conjugate (PCV13) vaccine. You may need this if you have certain conditions and were not previously vaccinated.  Pneumococcal polysaccharide (PPSV23) vaccine. You may need one or two doses if you smoke cigarettes or if you have certain conditions.  Meningococcal vaccine. You may need this if you have certain  conditions.  Hepatitis A vaccine. You may need this if you have certain conditions or if you travel or work in places where you may be exposed to hepatitis A.  Hepatitis B vaccine. You may need this if you have certain conditions or if you travel or work in places where you may be exposed to hepatitis B.  Haemophilus influenzae type b (Hib) vaccine. You may need this if you have certain conditions.  Talk to your health care provider about which screenings and vaccines you need and how often you need them. This information is not intended to replace advice given to you by your health care provider. Make sure you discuss any questions you have with your health care provider. Document Released: 12/07/2015 Document Revised: 07/30/2016 Document Reviewed: 09/11/2015 Elsevier Interactive Patient Education  2017 [employer].

## 2017-06-16 ENCOUNTER — Encounter: Payer: Self-pay | Admitting: Obstetrics and Gynecology

## 2017-06-17 DIAGNOSIS — L089 Local infection of the skin and subcutaneous tissue, unspecified: Secondary | ICD-10-CM | POA: Diagnosis not present

## 2017-06-17 LAB — PAPIG, HPV, RFX 16/18
HPV, high-risk: POSITIVE — AB
PAP Smear Comment: 0

## 2017-06-19 ENCOUNTER — Encounter: Payer: Self-pay | Admitting: Obstetrics and Gynecology

## 2017-06-19 ENCOUNTER — Telehealth: Payer: Self-pay | Admitting: Obstetrics and Gynecology

## 2017-06-19 NOTE — Telephone Encounter (Signed)
Pt is calling about her lab result. Please leave detailed message.

## 2017-06-22 ENCOUNTER — Telehealth: Payer: Self-pay | Admitting: Obstetrics and Gynecology

## 2017-06-22 NOTE — Telephone Encounter (Signed)
Please advise 

## 2017-06-22 NOTE — Telephone Encounter (Signed)
Pt is calling to schedule for LEEP procedure . Please advise

## 2017-06-30 DIAGNOSIS — H43812 Vitreous degeneration, left eye: Secondary | ICD-10-CM | POA: Diagnosis not present

## 2017-07-08 ENCOUNTER — Ambulatory Visit: Payer: BLUE CROSS/BLUE SHIELD

## 2017-07-08 ENCOUNTER — Ambulatory Visit: Payer: BLUE CROSS/BLUE SHIELD | Admitting: Obstetrics and Gynecology

## 2017-07-10 ENCOUNTER — Encounter: Payer: Self-pay | Admitting: Obstetrics and Gynecology

## 2017-07-10 ENCOUNTER — Encounter: Payer: Self-pay | Admitting: Internal Medicine

## 2017-07-10 DIAGNOSIS — L700 Acne vulgaris: Secondary | ICD-10-CM | POA: Diagnosis not present

## 2017-07-10 DIAGNOSIS — L089 Local infection of the skin and subcutaneous tissue, unspecified: Secondary | ICD-10-CM | POA: Diagnosis not present

## 2017-07-10 NOTE — Telephone Encounter (Signed)
Pt sent over results from [facility]. Please Advise.

## 2017-07-20 ENCOUNTER — Encounter: Payer: Self-pay | Admitting: Emergency Medicine

## 2017-07-20 ENCOUNTER — Ambulatory Visit
Admission: EM | Admit: 2017-07-20 | Discharge: 2017-07-20 | Disposition: A | Discharge status: Home / Self Care | Payer: BLUE CROSS/BLUE SHIELD | Attending: Family Medicine | Admitting: Family Medicine

## 2017-07-20 ENCOUNTER — Encounter: Payer: Self-pay | Admitting: Obstetrics and Gynecology

## 2017-07-20 DIAGNOSIS — R51 Headache: Secondary | ICD-10-CM

## 2017-07-20 DIAGNOSIS — R519 Headache, unspecified: Secondary | ICD-10-CM

## 2017-07-20 DIAGNOSIS — R509 Fever, unspecified: Secondary | ICD-10-CM | POA: Diagnosis not present

## 2017-07-20 MED ORDER — NAPROXEN 500 MG PO TABS: 500.0000 mg | ORAL_TABLET | Freq: Two times a day (BID) | ORAL | 0 refills | Status: DC

## 2017-07-20 NOTE — ED Triage Notes (Signed)
Patient c/o HA that started this afternoon.  Patient reports fever this evening.  Patient denies any other cold symptoms.

## 2017-07-20 NOTE — ED Provider Notes (Signed)
[facility]    CSN: [id number] Arrival date & time: 07/20/17  1933     History   Chief Complaint Chief Complaint  Patient presents with  . Headache    HPI Toni Kelly is a 56 y.o. female.   HPI  This a 56 year old female who at 2:00 this afternoon started to feel a headache and a feverish feeling. She states at home her temperature was 101.4 but in the office was 99.9. No source for her complaints. He denies any nausea vomiting diarrhea or obstipation urinary tract symptoms coughing sinus symptoms or shortness of breath. She has taken 1 doxycycline. She did not take anything for her headache. States she was too late today to see her primary care and came here       Past Medical History:  Diagnosis Date  . Hemorrhoids 2011  . Hyperlipidemia   . Hypertension     Patient Active Problem List   Diagnosis Date Noted  . Localized edema 10/31/2015  . Elbow tendonitis 10/31/2015  . Hyperlipidemia 09/28/2015  . Anal fistula 07/21/2013    Past Surgical History:  Procedure Laterality Date  . ABDOMINAL HYSTERECTOMY    . COLONOSCOPY  2011   @ [facility]  . hemorrhoids banding  2011  . TONSILLECTOMY      OB History    Gravida Para Term Preterm AB Living   2 2       2    SAB TAB Ectopic Multiple Live Births                  Obstetric Comments   1st Menstrual Cycle:  15 1st Pregnancy:  17       Home Medications    Prior to Admission medications   Medication Sig Start Date End Date Taking? Authorizing Provider  Dapsone (ACZONE) 5 % topical gel Apply topically 2 (two) times daily.   Yes [provider]  omeprazole (PRILOSEC) 20 MG capsule Take 20 mg by mouth daily.   Yes [provider]  hydrochlorothiazide (HYDRODIURIL) 25 MG tablet TAKE 1 TABLET (25 MG TOTAL) BY MOUTH DAILY. 06/11/17   Glean Hess, MD  naproxen (NAPROSYN) 500 MG tablet Take 1 tablet (500 mg total) by mouth 2 (two) times daily. 07/20/17   Lorin Picket, PA-C    simvastatin (ZOCOR) 20 MG tablet Take 1 tablet (20 mg total) by mouth daily. 03/31/16   Glean Hess, MD    Family History Family History  Problem Relation Age of Onset  . Breast cancer Maternal Aunt 75  . Breast cancer Cousin 69  . Heart failure Mother     Social History Social History  Substance Use Topics  . Smoking status: Never Smoker  . Smokeless tobacco: Never Used  . Alcohol use 0.0 oz/week     Allergies   Codeine and Ultram [tramadol]   Review of Systems Review of Systems  Constitutional: Positive for activity change. Negative for chills, fatigue and fever.  HENT: Negative for congestion, postnasal drip, rhinorrhea, sinus pain, sinus pressure and sneezing.   All other systems reviewed and are negative.    Physical Exam Triage Vital Signs ED Triage Vitals  Enc Vitals Group     BP 07/20/17 1940 129/81     Pulse Rate 07/20/17 1940 (!) 103     Resp 07/20/17 1940 16     Temp 07/20/17 1940 99.9 F (37.7 C)     Temp Source 07/20/17 1940 Oral     SpO2  07/20/17 1940 97 %     Weight 07/20/17 1938 270 lb (122.5 kg)     Height 07/20/17 1938 5\' 4"  (1.626 m)     Head Circumference --      Peak Flow --      Pain Score 07/20/17 1938 4     Pain Loc --      Pain Edu? --      Excl. in [place]? --    No data found.   Updated Vital Signs BP 129/81 (BP Location: Right Arm)   Pulse (!) 103   Temp 99.9 F (37.7 C) (Oral)   Resp 16   Ht 5\' 4"  (1.626 m)   Wt 270 lb (122.5 kg)   SpO2 97%   BMI 46.35 kg/m   Visual Acuity Right Eye Distance:   Left Eye Distance:   Bilateral Distance:    Right Eye Near:   Left Eye Near:    Bilateral Near:     Physical Exam  Constitutional: She is oriented to person, place, and time. She appears well-developed and well-nourished. No distress.  HENT:  Head: Normocephalic.  Right Ear: External ear normal.  Left Ear: External ear normal.  Nose: Nose normal.  Mouth/Throat: Oropharynx is clear and moist. No oropharyngeal  exudate.  Eyes: Pupils are equal, round, and reactive to light. Right eye exhibits no discharge. Left eye exhibits no discharge.  Neck: Normal range of motion.  Pulmonary/Chest: Effort normal and breath sounds normal.  Musculoskeletal: Normal range of motion.  Lymphadenopathy:    She has no cervical adenopathy.  Neurological: She is alert and oriented to person, place, and time.  Skin: Skin is warm and dry. She is not diaphoretic.  Psychiatric: She has a normal mood and affect. Her behavior is normal. Judgment and thought content normal.  Nursing note and vitals reviewed.    UC Treatments / Results  Labs (all labs ordered are listed, but only abnormal results are displayed) Labs Reviewed - No data to display  EKG  EKG Interpretation None       Radiology No results found.  Procedures Procedures (including critical care time)  Medications Ordered in UC Medications - No data to display   Initial Impression / Assessment and Plan / UC Course  I have reviewed the triage vital signs and the nursing notes.  Pertinent labs & imaging results that were available during my care of the patient were reviewed by me and considered in my medical decision making (see chart for details).     Plan: 1. Test/x-ray results and diagnosis reviewed with patient 2. rx as per orders; risks, benefits, potential side effects reviewed with patient 3. Recommend supportive treatment with use of Naprosyn for headache and fever. Take Naprosyn with food. Told her that this is likely a virus and does not need to be treated with doxycycline. She should stop this at this time. His may be early on her course of illness and is likely viral in nature. It'll have to run its course. If she does have any symptoms that localize glad to see her again and reexamine her. She should follow-up with her primary care physician. 4. F/u prn if symptoms worsen or don't improve   Final Clinical Impressions(s) / UC  Diagnoses   Final diagnoses:  Acute nonintractable headache, unspecified headache type  Fever, unspecified    New Prescriptions Discharge Medication List as of 07/20/2017  7:55 PM    START taking these medications   Details  naproxen (NAPROSYN)  500 MG tablet Take 1 tablet (500 mg total) by mouth 2 (two) times daily., Starting Mon 07/20/2017, Normal         Controlled Substance Prescriptions [place] Controlled Substance Registry consulted? Not Applicable   Lorin Picket, PA-C 07/20/17 2007

## 2017-07-31 ENCOUNTER — Ambulatory Visit (INDEPENDENT_AMBULATORY_CARE_PROVIDER_SITE_OTHER): Payer: BLUE CROSS/BLUE SHIELD | Admitting: Obstetrics and Gynecology

## 2017-07-31 ENCOUNTER — Encounter: Payer: Self-pay | Admitting: Obstetrics and Gynecology

## 2017-07-31 VITALS — BP 132/80 | HR 73 | Ht 64.0 in | Wt 264.0 lb

## 2017-07-31 DIAGNOSIS — N72 Inflammatory disease of cervix uteri: Secondary | ICD-10-CM | POA: Diagnosis not present

## 2017-07-31 DIAGNOSIS — R87810 Cervical high risk human papillomavirus (HPV) DNA test positive: Secondary | ICD-10-CM

## 2017-07-31 NOTE — Progress Notes (Signed)
   LEEP PROCEDURE NOTE  The LEEP has been explained to the patient in detail; risks/benefits reviewed.  The risks include, but are not limited to, bleeding, infection, and the possibility of cervical stenosis or cervical incompetence.  The patient had previously been given information regarding abnormal PAP smears and their relationship to HPV.  We have discussed the natural course and history of HPV, the possibility of incomplete treatment by the LEEP, as well as the possibility of recurrence.  I have reviewed the consent form for LEEP with her, and she fully understands its contents.  We have discussed the procedure itself.  The patient has suffered from persistent HPV positive paps smears dating back to 2013.  Colposcopies have been negative.  Given persistance and concern for possible missed pathology on colposcopy biopsies the discussion was had with the patient about proceeding with LEEP to obtain a larger representative biopsy and hopefully facilitate HPV clearance.    I have informed her that following the LEEP she should refrain from intercourse and the use of tampons for three weeks, and that she should also expect some spotting and brown/black discharge over the next several days.  We have discussed the fact that vaginal bleeding, differentiated from spotting, is not normal and that if she should have this complication, she should contact me immediately.  The follow-up after LEEP will be PAP smears or viral typing performed at regular intervals for up to 3-5 years.  Should these all prove to be normal, she will then be back on typical cervical screening.  I have answered all of her questions, and I believe she has an adequate understanding of the LEEP, its implications, and the necessity of follow-up care.  I discussed her colpo results and explained the procedure of LEEP.  All questions were answered and she signed the consent form.    LEEP performed in the usual manner after reviewing the  previous colpo findings and results. Acetic acid solution was usesd to identify any abnormal areas of the cervix.  The cervix was cleansed with betadine solution. Local injection of Lidocaine was performed for anesthesia. Only an ectocervical specimen was obtained given prior [facility] and relatively small cervix with minimal remaining cervical stroma  using the loop electrodes without difficulty.  It was labeled accordingly. Monsel solution was applied.  Malachy Mood, MD 07/31/2017 10:31 AM

## 2017-08-04 ENCOUNTER — Encounter: Payer: Self-pay | Admitting: Obstetrics and Gynecology

## 2017-08-04 LAB — PATHOLOGY

## 2017-08-22 DIAGNOSIS — Z23 Encounter for immunization: Secondary | ICD-10-CM | POA: Diagnosis not present

## 2017-09-14 ENCOUNTER — Ambulatory Visit (INDEPENDENT_AMBULATORY_CARE_PROVIDER_SITE_OTHER): Payer: BLUE CROSS/BLUE SHIELD | Admitting: Obstetrics and Gynecology

## 2017-09-14 ENCOUNTER — Encounter: Payer: Self-pay | Admitting: Obstetrics and Gynecology

## 2017-09-14 VITALS — BP 122/80 | HR 81 | Ht 64.0 in | Wt 269.0 lb

## 2017-09-14 DIAGNOSIS — Z09 Encounter for follow-up examination after completed treatment for conditions other than malignant neoplasm: Secondary | ICD-10-CM

## 2017-09-14 NOTE — Progress Notes (Signed)
      Postoperative Follow-up Patient presents post op from LEEP 6weeks ago for persistently positive HR HPV.  Subjective: Patient reports marked improvement in her preop symptoms. Eating a regular diet without difficulty. The patient is not having any pain.  Activity: normal activities of daily living.  Objective: Vitals:   09/14/17 0825  BP: 122/80  Pulse: 81   Gen: NAD GU: normal external female genitalia.  Well healed leap bed.   Ext: no edema  Assessment: 56 y.o. s/p LEEP stable  Plan: Patient has done well after surgery with no apparent complications.  I have discussed the post-operative course to date, and the expected progress moving forward.  The patient understands what complications to be concerned about.  I will see the patient in routine follow up, or sooner if needed.    Activity plan: No restriction.  Given no changes on LEEP specimen, multiple negative colposcopies in the past follow up in 1 year for next annual and repeat pap  Malachy Mood 09/14/2017, 8:38 AM

## 2017-09-22 DIAGNOSIS — H40003 Preglaucoma, unspecified, bilateral: Secondary | ICD-10-CM | POA: Diagnosis not present

## 2017-09-28 DIAGNOSIS — H40003 Preglaucoma, unspecified, bilateral: Secondary | ICD-10-CM | POA: Diagnosis not present

## 2018-01-10 ENCOUNTER — Other Ambulatory Visit: Payer: Self-pay | Admitting: Internal Medicine

## 2018-01-10 DIAGNOSIS — E785 Hyperlipidemia, unspecified: Secondary | ICD-10-CM

## 2018-01-11 ENCOUNTER — Encounter: Payer: Self-pay | Admitting: Obstetrics and Gynecology

## 2018-01-11 ENCOUNTER — Encounter: Payer: Self-pay | Admitting: Internal Medicine

## 2018-01-14 ENCOUNTER — Encounter: Payer: Self-pay | Admitting: Internal Medicine

## 2018-01-14 ENCOUNTER — Ambulatory Visit (INDEPENDENT_AMBULATORY_CARE_PROVIDER_SITE_OTHER): Payer: BLUE CROSS/BLUE SHIELD | Admitting: Internal Medicine

## 2018-01-14 VITALS — BP 122/68 | HR 85 | Ht 64.0 in | Wt 262.0 lb

## 2018-01-14 DIAGNOSIS — R600 Localized edema: Secondary | ICD-10-CM

## 2018-01-14 DIAGNOSIS — K219 Gastro-esophageal reflux disease without esophagitis: Secondary | ICD-10-CM | POA: Diagnosis not present

## 2018-01-14 DIAGNOSIS — E782 Mixed hyperlipidemia: Secondary | ICD-10-CM

## 2018-01-14 DIAGNOSIS — Z1159 Encounter for screening for other viral diseases: Secondary | ICD-10-CM

## 2018-01-14 MED ORDER — SIMVASTATIN 20 MG PO TABS: 20.0000 mg | ORAL_TABLET | Freq: Every day | ORAL | 3 refills | Status: DC

## 2018-01-14 NOTE — Progress Notes (Signed)
Date:  01/14/2018   Name:  Toni Kelly   DOB:  1961-04-28   MRN:  [id number]   Chief Complaint: Edema; Hyperlipidemia; and Gastroesophageal Reflux Hyperlipidemia  This is a chronic problem. Pertinent negatives include no chest pain or shortness of breath. Current antihyperlipidemic treatment includes statins.  Gastroesophageal Reflux  She complains of heartburn. She reports no abdominal pain, no chest pain, no tooth decay or no wheezing. This is a recurrent problem. The problem occurs rarely. Pertinent negatives include no fatigue.   Edema - controlled with HCTZ 25 mg daily. No issues with blood pressure noted. She tires to follow a low salt diet.  Lab Results  Component Value Date   CHOL 191 10/31/2015   HDL 65 10/31/2015   LDLCALC 109 (H) 10/31/2015   TRIG 84 10/31/2015   CHOLHDL 2.9 10/31/2015   Lab Results  Component Value Date   ALT 33 (H) 10/31/2015   AST 30 10/31/2015   ALKPHOS 80 10/31/2015   BILITOT 0.5 10/31/2015     Review of Systems  Constitutional: Negative for chills, fatigue and fever.  HENT: Negative for trouble swallowing.   Eyes: Negative for visual disturbance.  Respiratory: Negative for chest tightness, shortness of breath and wheezing.   Cardiovascular: Positive for leg swelling. Negative for chest pain and palpitations.  Gastrointestinal: Positive for heartburn. Negative for abdominal pain and blood in stool.  Genitourinary: Negative for difficulty urinating.  Musculoskeletal: Negative for arthralgias.  Allergic/Immunologic: Negative for environmental allergies.  Neurological: Negative for dizziness and headaches.  Psychiatric/Behavioral: Negative for dysphoric mood and sleep disturbance. The patient is not nervous/anxious.     Patient Active Problem List   Diagnosis Date Noted  . Localized edema 10/31/2015  . Elbow tendonitis 10/31/2015  . Mixed hyperlipidemia 09/28/2015  . Anal fistula 07/21/2013    Prior to Admission medications     Medication Sig Start Date End Date Taking? Authorizing Provider  clindamycin (CLINDAGEL) 1 % gel APPLY TOPICALLY EVERY DAY BEFORE NOON 08/27/17   [provider]  Dapsone (ACZONE) 5 % topical gel Apply topically 2 (two) times daily.    [provider]  hydrochlorothiazide (HYDRODIURIL) 25 MG tablet TAKE 1 TABLET (25 MG TOTAL) BY MOUTH DAILY. 06/11/17   Glean Hess, MD  omeprazole (PRILOSEC) 20 MG capsule Take 20 mg by mouth daily.    [provider]  simvastatin (ZOCOR) 20 MG tablet Take 1 tablet (20 mg total) by mouth daily. 03/31/16   Glean Hess, MD  spironolactone (ALDACTONE) 50 MG tablet Take 50 mg by mouth daily. 07/10/17   [provider]    Allergies  Allergen Reactions  . Codeine Itching  . Ultram [Tramadol] Itching    Past Surgical History:  Procedure Laterality Date  . ABDOMINAL HYSTERECTOMY    . CERVICAL BIOPSY  W/ LOOP ELECTRODE EXCISION  05/2017   [facility]  . COLONOSCOPY  2011   @ [facility]  . hemorrhoids banding  2011  . TONSILLECTOMY      Social History   Tobacco Use  . Smoking status: Never Smoker  . Smokeless tobacco: Never Used  Substance Use Topics  . Alcohol use: Yes    Alcohol/week: 0.0 oz  . Drug use: No     Medication list has been reviewed and updated.  PHQ 2/9 Scores 01/14/2018  PHQ - 2 Score 0  PHQ- 9 Score 0    Physical Exam  Constitutional: She is oriented to person, place, and time. She appears well-developed.  No distress.  HENT:  Head: Normocephalic and atraumatic.  Neck: Normal range of motion. Neck supple.  Cardiovascular: Normal rate, regular rhythm and normal heart sounds.  Pulmonary/Chest: Effort normal and breath sounds normal. No respiratory distress. She has no wheezes.  Musculoskeletal: Normal range of motion. She exhibits tenderness (hips).  Neurological: She is alert and oriented to person, place, and time.  Skin: Skin is warm and dry. No rash noted.  Psychiatric: She has a normal  mood and affect. Her speech is normal and behavior is normal. Thought content normal.  Nursing note and vitals reviewed.   BP 122/68   Pulse 85   Ht 5\' 4"  (1.626 m)   Wt 262 lb (118.8 kg)   SpO2 96%   BMI 44.97 kg/m   Assessment and Plan: 1. Localized edema controlled - CBC with Differential/Platelet - TSH  2. Mixed hyperlipidemia On statin therapy - Comprehensive metabolic panel - Lipid panel - simvastatin (ZOCOR) 20 MG tablet; Take 1 tablet (20 mg total) by mouth daily.  Dispense: 90 tablet; Refill: 3  3. Need for hepatitis C screening test - Hepatitis C antibody  4. Gastroesophageal reflux disease, esophagitis presence not specified Controlled with PPI   Meds ordered this encounter  Medications  . simvastatin (ZOCOR) 20 MG tablet    Sig: Take 1 tablet (20 mg total) by mouth daily.    Dispense:  90 tablet    Refill:  3    Partially dictated using Editor, commissioning. Any errors are unintentional.  Halina Maidens, MD [facility]  01/14/2018

## 2018-01-15 LAB — COMPREHENSIVE METABOLIC PANEL
ALT: 31 IU/L (ref 0–32)
AST: 34 IU/L (ref 0–40)
Albumin/Globulin Ratio: 1.3 (ref 1.2–2.2)
Albumin: 4.7 g/dL (ref 3.5–5.5)
Alkaline Phosphatase: 90 IU/L (ref 39–117)
BUN/Creatinine Ratio: 16 (ref 9–23)
BUN: 18 mg/dL (ref 6–24)
Bilirubin Total: 0.6 mg/dL (ref 0.0–1.2)
CO2: 23 mmol/L (ref 20–29)
Calcium: 9.8 mg/dL (ref 8.7–10.2)
Chloride: 100 mmol/L (ref 96–106)
Creatinine, Ser: 1.13 mg/dL — ABNORMAL HIGH (ref 0.57–1.00)
GFR calc Af Amer: 62 mL/min/{1.73_m2} (ref >59)
GFR calc non Af Amer: 54 mL/min/{1.73_m2} — ABNORMAL LOW (ref >59)
Globulin, Total: 3.6 g/dL (ref 1.5–4.5)
Glucose: 95 mg/dL (ref 65–99)
Potassium: 4.4 mmol/L (ref 3.5–5.2)
Sodium: 142 mmol/L (ref 134–144)
Total Protein: 8.3 g/dL (ref 6.0–8.5)

## 2018-01-15 LAB — CBC WITH DIFFERENTIAL/PLATELET
Basophils Absolute: 0 10*3/uL (ref 0.0–0.2)
Basos: 0 %
EOS (ABSOLUTE): 0.2 10*3/uL (ref 0.0–0.4)
Eos: 2 %
Hematocrit: 47.1 % — ABNORMAL HIGH (ref 34.0–46.6)
Hemoglobin: 16.2 g/dL — ABNORMAL HIGH (ref 11.1–15.9)
Immature Grans (Abs): 0 10*3/uL (ref 0.0–0.1)
Immature Granulocytes: 0 %
Lymphocytes Absolute: 2.6 10*3/uL (ref 0.7–3.1)
Lymphs: 28 %
MCH: 31 pg (ref 26.6–33.0)
MCHC: 34.4 g/dL (ref 31.5–35.7)
MCV: 90 fL (ref 79–97)
Monocytes Absolute: 0.8 10*3/uL (ref 0.1–0.9)
Monocytes: 8 %
Neutrophils Absolute: 5.7 10*3/uL (ref 1.4–7.0)
Neutrophils: 62 %
Platelets: 324 10*3/uL (ref 150–379)
RBC: 5.22 x10E6/uL (ref 3.77–5.28)
RDW: 13.8 % (ref 12.3–15.4)
WBC: 9.3 10*3/uL (ref 3.4–10.8)

## 2018-01-15 LAB — TSH: TSH: 1.33 u[IU]/mL (ref 0.450–4.500)

## 2018-01-15 LAB — LIPID PANEL
Chol/HDL Ratio: 3.5 ratio (ref 0.0–4.4)
Cholesterol, Total: 221 mg/dL — ABNORMAL HIGH (ref 100–199)
HDL: 64 mg/dL (ref >39)
LDL Calculated: 127 mg/dL — ABNORMAL HIGH (ref 0–99)
Triglycerides: 150 mg/dL — ABNORMAL HIGH (ref 0–149)
VLDL Cholesterol Cal: 30 mg/dL (ref 5–40)

## 2018-01-15 LAB — HEPATITIS C ANTIBODY: Hep C Virus Ab: <0.1 s/co ratio (ref 0.0–0.9)

## 2018-03-12 ENCOUNTER — Encounter: Payer: Self-pay | Admitting: Internal Medicine

## 2018-03-12 ENCOUNTER — Ambulatory Visit (INDEPENDENT_AMBULATORY_CARE_PROVIDER_SITE_OTHER): Payer: BLUE CROSS/BLUE SHIELD | Admitting: Internal Medicine

## 2018-03-12 VITALS — BP 128/74 | HR 84 | Temp 97.9°F | Ht 64.0 in | Wt 263.0 lb

## 2018-03-12 DIAGNOSIS — J40 Bronchitis, not specified as acute or chronic: Secondary | ICD-10-CM

## 2018-03-12 MED ORDER — AZITHROMYCIN 250 MG PO TABS: ORAL_TABLET | ORAL | 0 refills | Status: DC

## 2018-03-12 NOTE — Patient Instructions (Signed)
Increase Fluids Continue Mucinex DM and guaifenesin Delsym over the counter for cough suppression

## 2018-03-12 NOTE — Progress Notes (Signed)
Date:  03/12/2018   Name:  Toni Kelly   DOB:  01-Feb-1961   MRN:  [id number]   Chief Complaint: Cough (X 3 days Coughing with green production. Head aching. Tried OTC meds but no relief. Mucinex, tylenol sinus... )  Cough  This is a new problem. The current episode started in the past 7 days. The problem has been unchanged. The problem occurs every few hours. The cough is productive of sputum (intermittent green sputum). Pertinent negatives include no chest pain, chills, fever, headaches, hemoptysis, shortness of breath, weight loss or wheezing. She has tried OTC cough suppressant for the symptoms. The treatment provided mild relief. There is no history of asthma or COPD.     Review of Systems  Constitutional: Negative for chills, fever, unexpected weight change and weight loss.  Eyes: Negative for visual disturbance.  Respiratory: Positive for cough. Negative for hemoptysis, shortness of breath and wheezing.   Cardiovascular: Negative for chest pain and palpitations.  Neurological: Negative for dizziness and headaches.    Patient Active Problem List   Diagnosis Date Noted  . Gastroesophageal reflux disease 01/14/2018  . Localized edema 10/31/2015  . Elbow tendonitis 10/31/2015  . Mixed hyperlipidemia 09/28/2015  . Anal fistula 07/21/2013    Prior to Admission medications   Medication Sig Start Date End Date Taking? Authorizing Provider  clindamycin (CLINDAGEL) 1 % gel APPLY TOPICALLY EVERY DAY BEFORE NOON 08/27/17  Yes [provider]  Dapsone (ACZONE) 5 % topical gel Apply topically 2 (two) times daily.   Yes [provider]  hydrochlorothiazide (HYDRODIURIL) 25 MG tablet TAKE 1 TABLET (25 MG TOTAL) BY MOUTH DAILY. 06/11/17  Yes Glean Hess, MD  omeprazole (PRILOSEC) 20 MG capsule Take 20 mg by mouth daily.   Yes [provider]  simvastatin (ZOCOR) 20 MG tablet Take 1 tablet (20 mg total) by mouth daily. 01/14/18  Yes Glean Hess, MD    spironolactone (ALDACTONE) 50 MG tablet Take 50 mg by mouth daily. 07/10/17  Yes [provider]    Allergies  Allergen Reactions  . Codeine Itching  . Ultram [Tramadol] Itching    Past Surgical History:  Procedure Laterality Date  . ABDOMINAL HYSTERECTOMY    . CERVICAL BIOPSY  W/ LOOP ELECTRODE EXCISION  05/2017   [facility]  . COLONOSCOPY  2011   @ [facility]  . hemorrhoids banding  2011  . TONSILLECTOMY      Social History   Tobacco Use  . Smoking status: Never Smoker  . Smokeless tobacco: Never Used  Substance Use Topics  . Alcohol use: Yes    Alcohol/week: 0.0 oz  . Drug use: No     Medication list has been reviewed and updated.  PHQ 2/9 Scores 01/14/2018  PHQ - 2 Score 0  PHQ- 9 Score 0    Physical Exam  Constitutional: She is oriented to person, place, and time. She appears well-developed. No distress.  HENT:  Head: Normocephalic and atraumatic.  Right Ear: Tympanic membrane and ear canal normal.  Left Ear: Tympanic membrane and ear canal normal.  Nose: Right sinus exhibits no maxillary sinus tenderness and no frontal sinus tenderness. Left sinus exhibits no maxillary sinus tenderness and no frontal sinus tenderness.  Mouth/Throat: No posterior oropharyngeal edema or posterior oropharyngeal erythema.  Neck: Normal range of motion. Neck supple.  Cardiovascular: Normal rate, regular rhythm and normal heart sounds.  No murmur heard. Pulmonary/Chest: Effort normal and breath sounds normal. No respiratory distress. She has  no wheezes.  Musculoskeletal: Normal range of motion.  Lymphadenopathy:    She has no cervical adenopathy.  Neurological: She is alert and oriented to person, place, and time.  Skin: Skin is warm and dry. No rash noted.  Psychiatric: She has a normal mood and affect. Her behavior is normal. Thought content normal.    BP 128/74   Pulse 84   Temp 97.9 F (36.6 C) (Oral)   Ht 5\' 4"  (1.626 m)   Wt 263 lb (119.3 kg)   SpO2 93%   BMI  45.14 kg/m   Assessment and Plan: 1. Bronchitis Continue Mucinex DM, begin Delsym Begin Zpak only if sx worsen - azithromycin (ZITHROMAX Z-PAK) 250 MG tablet; UAD  Dispense: 6 each; Refill: 0   Meds ordered this encounter  Medications  . azithromycin (ZITHROMAX Z-PAK) 250 MG tablet    Sig: UAD    Dispense:  6 each    Refill:  0    Partially dictated using Editor, commissioning. Any errors are unintentional.  Halina Maidens, MD [facility]  03/12/2018

## 2018-03-24 ENCOUNTER — Telehealth: Payer: Self-pay | Admitting: Internal Medicine

## 2018-03-24 NOTE — Telephone Encounter (Signed)
10 days will be Friday -Explained this URI has lingering cough last to go. She will call Friday if no better. Also advised to increase fluid intake to help thin the lining of mucus helping cough it up. Agreed.

## 2018-03-24 NOTE — Telephone Encounter (Signed)
Finished antibiotic for 10days need another azithromycin (ZITHROMAX Z-PAK) 250 MG tablet

## 2018-03-29 DIAGNOSIS — H40053 Ocular hypertension, bilateral: Secondary | ICD-10-CM | POA: Diagnosis not present

## 2018-04-20 ENCOUNTER — Other Ambulatory Visit: Payer: Self-pay | Admitting: Obstetrics and Gynecology

## 2018-04-20 DIAGNOSIS — Z1231 Encounter for screening mammogram for malignant neoplasm of breast: Secondary | ICD-10-CM

## 2018-05-07 ENCOUNTER — Encounter: Payer: Self-pay | Admitting: Obstetrics and Gynecology

## 2018-06-06 ENCOUNTER — Other Ambulatory Visit: Payer: Self-pay | Admitting: Internal Medicine

## 2018-06-06 DIAGNOSIS — R600 Localized edema: Secondary | ICD-10-CM

## 2018-06-07 ENCOUNTER — Ambulatory Visit
Admission: RE | Admit: 2018-06-07 | Discharge: 2018-06-07 | Disposition: A | Discharge status: Home / Self Care | Payer: BLUE CROSS/BLUE SHIELD | Source: Ambulatory Visit | Attending: Obstetrics and Gynecology | Admitting: Obstetrics and Gynecology

## 2018-06-07 DIAGNOSIS — Z1231 Encounter for screening mammogram for malignant neoplasm of breast: Secondary | ICD-10-CM | POA: Diagnosis not present

## 2018-06-08 ENCOUNTER — Encounter (INDEPENDENT_AMBULATORY_CARE_PROVIDER_SITE_OTHER): Payer: Self-pay

## 2018-06-09 ENCOUNTER — Encounter: Payer: Self-pay | Admitting: Internal Medicine

## 2018-06-10 ENCOUNTER — Encounter: Payer: Self-pay | Admitting: Internal Medicine

## 2018-06-11 ENCOUNTER — Encounter: Payer: Self-pay | Admitting: Internal Medicine

## 2018-06-11 ENCOUNTER — Ambulatory Visit
Admission: RE | Admit: 2018-06-11 | Discharge: 2018-06-11 | Disposition: A | Discharge status: Home / Self Care | Payer: BLUE CROSS/BLUE SHIELD | Source: Ambulatory Visit | Attending: Internal Medicine | Admitting: Internal Medicine

## 2018-06-11 ENCOUNTER — Ambulatory Visit (INDEPENDENT_AMBULATORY_CARE_PROVIDER_SITE_OTHER): Payer: BLUE CROSS/BLUE SHIELD | Admitting: Internal Medicine

## 2018-06-11 VITALS — BP 122/78 | HR 78 | Temp 97.9°F | Ht 64.0 in | Wt 255.0 lb

## 2018-06-11 DIAGNOSIS — M7061 Trochanteric bursitis, right hip: Secondary | ICD-10-CM | POA: Diagnosis not present

## 2018-06-11 DIAGNOSIS — M7062 Trochanteric bursitis, left hip: Secondary | ICD-10-CM | POA: Diagnosis not present

## 2018-06-11 DIAGNOSIS — K219 Gastro-esophageal reflux disease without esophagitis: Secondary | ICD-10-CM | POA: Diagnosis not present

## 2018-06-11 DIAGNOSIS — R059 Cough, unspecified: Secondary | ICD-10-CM

## 2018-06-11 DIAGNOSIS — R05 Cough: Secondary | ICD-10-CM | POA: Insufficient documentation

## 2018-06-11 MED ORDER — AMOXICILLIN-POT CLAVULANATE 875-125 MG PO TABS: 1.0000 | ORAL_TABLET | Freq: Two times a day (BID) | ORAL | 0 refills | Status: DC

## 2018-06-11 MED ORDER — PREDNISONE 10 MG PO TABS: ORAL_TABLET | ORAL | 0 refills | Status: DC

## 2018-06-11 MED ORDER — OMEPRAZOLE 20 MG PO CPDR: 20.0000 mg | DELAYED_RELEASE_CAPSULE | Freq: Two times a day (BID) | ORAL | 5 refills | Status: DC

## 2018-06-11 NOTE — Progress Notes (Signed)
Date:  06/11/2018   Name:  Toni Kelly   DOB:  1961-06-19   MRN:  [id number]   Chief Complaint: Cough (Coughing up green thick mucous since April. ) and Hip Pain (All night long since about 6 months ago she is having pains on both sides while laying down at night. Has to toss and turn all night. ) Cough  This is a chronic problem. The current episode started more than 1 month ago. The problem has been unchanged. The cough is productive of sputum. Associated symptoms include heartburn and myalgias. Pertinent negatives include no chest pain, chills, fever, headaches or wheezing. Treatments tried: antibiotics - Zpak.  Hip Pain   There was no injury mechanism. The pain is present in the right hip and left hip. The quality of the pain is described as aching. The pain is moderate. The pain has been constant since onset. She has tried ice (and icy hot) for the symptoms.  Gastroesophageal Reflux  She complains of coughing, globus sensation and heartburn. She reports no abdominal pain, no chest pain or no wheezing. This is a new problem. The problem occurs frequently. Pertinent negatives include no fatigue. She has tried a PPI for the symptoms. The treatment provided mild relief. seen by ENT and had scope.      Review of Systems  Constitutional: Negative for chills, fatigue and fever.  Respiratory: Positive for cough. Negative for chest tightness, wheezing and stridor.   Cardiovascular: Negative for chest pain, palpitations and leg swelling.  Gastrointestinal: Positive for heartburn. Negative for abdominal pain.  Musculoskeletal: Positive for arthralgias and myalgias. Negative for gait problem and joint swelling.  Neurological: Negative for dizziness and headaches.    Patient Active Problem List   Diagnosis Date Noted  . Gastroesophageal reflux disease 01/14/2018  . Localized edema 10/31/2015  . Elbow tendonitis 10/31/2015  . Mixed hyperlipidemia 09/28/2015  . Anal fistula 07/21/2013     Prior to Admission medications   Medication Sig Start Date End Date Taking? Authorizing Provider  Dapsone (ACZONE) 5 % topical gel Apply topically 2 (two) times daily.   Yes [provider]  hydrochlorothiazide (HYDRODIURIL) 25 MG tablet TAKE 1 TABLET (25 MG TOTAL) BY MOUTH DAILY. 06/06/18  Yes Glean Hess, MD  omeprazole (PRILOSEC) 20 MG capsule Take 20 mg by mouth daily.   Yes [provider]  simvastatin (ZOCOR) 20 MG tablet Take 1 tablet (20 mg total) by mouth daily. 01/14/18  Yes Glean Hess, MD  spironolactone (ALDACTONE) 50 MG tablet Take 50 mg by mouth daily. 07/10/17  Yes [provider]    Allergies  Allergen Reactions  . Codeine Itching  . Ultram [Tramadol] Itching    Past Surgical History:  Procedure Laterality Date  . ABDOMINAL HYSTERECTOMY    . CERVICAL BIOPSY  W/ LOOP ELECTRODE EXCISION  05/2017   [facility]  . COLONOSCOPY  2011   @ [facility]  . hemorrhoids banding  2011  . TONSILLECTOMY      Social History   Tobacco Use  . Smoking status: Never Smoker  . Smokeless tobacco: Never Used  Substance Use Topics  . Alcohol use: Yes    Alcohol/week: 0.0 oz  . Drug use: No     Medication list has been reviewed and updated.  Current Meds  Medication Sig  . Dapsone (ACZONE) 5 % topical gel Apply topically 2 (two) times daily.  . hydrochlorothiazide (HYDRODIURIL) 25 MG tablet TAKE 1 TABLET (25 MG TOTAL) BY MOUTH  DAILY.  . omeprazole (PRILOSEC) 20 MG capsule Take 20 mg by mouth daily.  . simvastatin (ZOCOR) 20 MG tablet Take 1 tablet (20 mg total) by mouth daily.  Marland Kitchen spironolactone (ALDACTONE) 50 MG tablet Take 50 mg by mouth daily.    PHQ 2/9 Scores 01/14/2018  PHQ - 2 Score 0  PHQ- 9 Score 0    Physical Exam  Constitutional: She is oriented to person, place, and time. She appears well-developed. No distress.  HENT:  Head: Normocephalic and atraumatic.  Neck: Normal range of motion. Neck supple.  Cardiovascular: Normal  rate, regular rhythm and normal heart sounds.  Pulmonary/Chest: Effort normal and breath sounds normal. No respiratory distress. She has no wheezes. She has no rhonchi.  Musculoskeletal: Normal range of motion.  Tender over both lateral hips  Neurological: She is alert and oriented to person, place, and time.  Skin: Skin is warm and dry. No rash noted.  Psychiatric: She has a normal mood and affect. Her behavior is normal. Thought content normal.  Nursing note and vitals reviewed.   BP 122/78   Pulse 78   Temp 97.9 F (36.6 C) (Oral)   Ht 5\' 4"  (1.626 m)   Wt 255 lb (115.7 kg)   SpO2 97%   BMI 43.77 kg/m   Assessment and Plan: 1. Cough - DG Chest 2 View; Future - amoxicillin-clavulanate (AUGMENTIN) 875-125 MG tablet; Take 1 tablet by mouth 2 (two) times daily.  Dispense: 20 tablet; Refill: 0  2. Gastroesophageal reflux disease, esophagitis presence not specified Increase PPI to bid - omeprazole (PRILOSEC) 20 MG capsule; Take 1 capsule (20 mg total) by mouth 2 (two) times daily before a meal.  Dispense: 60 capsule; Refill: 5  3. Trochanteric bursitis of both hips - predniSONE (DELTASONE) 10 MG tablet; Take 6 on day 1, 5 on day 2, 4 on day 3, 3 on day 4, 2 on day 5 and 1 on day 1 then stop.  Dispense: 21 tablet; Refill: 0   Meds ordered this encounter  Medications  . predniSONE (DELTASONE) 10 MG tablet    Sig: Take 6 on day 1, 5 on day 2, 4 on day 3, 3 on day 4, 2 on day 5 and 1 on day 1 then stop.    Dispense:  21 tablet    Refill:  0  . amoxicillin-clavulanate (AUGMENTIN) 875-125 MG tablet    Sig: Take 1 tablet by mouth 2 (two) times daily.    Dispense:  20 tablet    Refill:  0  . omeprazole (PRILOSEC) 20 MG capsule    Sig: Take 1 capsule (20 mg total) by mouth 2 (two) times daily before a meal.    Dispense:  60 capsule    Refill:  5    Partially dictated using Editor, commissioning. Any errors are unintentional.  Halina Maidens, MD [facility]  06/11/2018   There are no diagnoses linked to this encounter.

## 2018-06-12 ENCOUNTER — Encounter: Payer: Self-pay | Admitting: Internal Medicine

## 2018-06-16 ENCOUNTER — Other Ambulatory Visit (HOSPITAL_COMMUNITY)
Admission: RE | Admit: 2018-06-16 | Discharge: 2018-06-16 | Disposition: A | Discharge status: Home / Self Care | Payer: BLUE CROSS/BLUE SHIELD | Source: Ambulatory Visit | Attending: Obstetrics and Gynecology | Admitting: Obstetrics and Gynecology

## 2018-06-16 ENCOUNTER — Ambulatory Visit (INDEPENDENT_AMBULATORY_CARE_PROVIDER_SITE_OTHER): Payer: BLUE CROSS/BLUE SHIELD | Admitting: Obstetrics and Gynecology

## 2018-06-16 ENCOUNTER — Encounter: Payer: Self-pay | Admitting: Obstetrics and Gynecology

## 2018-06-16 VITALS — BP 130/88 | HR 75 | Ht 64.0 in | Wt 255.0 lb

## 2018-06-16 DIAGNOSIS — Z01419 Encounter for gynecological examination (general) (routine) without abnormal findings: Secondary | ICD-10-CM | POA: Diagnosis not present

## 2018-06-16 DIAGNOSIS — Z124 Encounter for screening for malignant neoplasm of cervix: Secondary | ICD-10-CM | POA: Insufficient documentation

## 2018-06-16 DIAGNOSIS — Z1231 Encounter for screening mammogram for malignant neoplasm of breast: Secondary | ICD-10-CM

## 2018-06-16 DIAGNOSIS — Z1239 Encounter for other screening for malignant neoplasm of breast: Secondary | ICD-10-CM

## 2018-06-16 NOTE — Progress Notes (Signed)
Gynecology Annual Exam  PCP: Glean Hess, MD  Chief Complaint:  Chief Complaint  Patient presents with  . Gynecologic Exam    History of Present Illness:Patient is a 57 y.o. G2P2 presents for annual exam. The patient has no complaints today.   LMP: No LMP recorded. Patient has had a hysterectomy.  The patient is sexually active. She denies dyspareunia.  The patient does perform self breast exams.  There is no notable family history of breast or ovarian cancer in her family.  The patient wears seatbelts: yes.   The patient has regular exercise: not asked.    The patient denies current symptoms of depression.     Review of Systems: Review of Systems  Constitutional: Negative.        + vasomotor symptoms  HENT: Negative.   Eyes: Negative.   Respiratory: Positive for cough. Negative for hemoptysis, sputum production, shortness of breath and wheezing.   Cardiovascular: Negative.   Gastrointestinal: Negative.   Genitourinary: Negative.   Musculoskeletal: Positive for joint pain. Negative for back pain, falls, myalgias and neck pain.  Skin: Negative.   Neurological: Negative.   Endo/Heme/Allergies: Negative.   Psychiatric/Behavioral: Negative.     Past Medical History:  Past Medical History:  Diagnosis Date  . Bursitis   . Esophageal reflux   . Hemorrhoids 2011  . Hyperlipidemia   . Hypertension     Past Surgical History:  Past Surgical History:  Procedure Laterality Date  . ABDOMINAL HYSTERECTOMY    . CERVICAL BIOPSY  W/ LOOP ELECTRODE EXCISION  05/2017   [facility]  . COLONOSCOPY  2011   @ [facility]  . hemorrhoids banding  2011  . TONSILLECTOMY      Gynecologic History:  No LMP recorded. Patient has had a hysterectomy. Last Pap: Results were:  07/31/2017 LEEP negative for dysplasia with well defined transformation zone  06/15/2017 ASCUS HPV positive 05/23/2016 [facility] HPV positive (negative colposcopy) Last mammogram: 06/07/18 Results were: Gillian Shields I        Obstetric History: G2P2  Family History:  Family History  Problem Relation Age of Onset  . Breast cancer Maternal Aunt 6  . Breast cancer Cousin 42  . Heart failure Mother     Social History:  Social History   Socioeconomic History  . Marital status: Widowed    Spouse name: Not on file  . Number of children: Not on file  . Years of education: Not on file  . Highest education level: Not on file  Occupational History  . Not on file  Social Needs  . Financial resource strain: Not on file  . Food insecurity:    Worry: Not on file    Inability: Not on file  . Transportation needs:    Medical: Not on file    Non-medical: Not on file  Tobacco Use  . Smoking status: Never Smoker  . Smokeless tobacco: Never Used  Substance and Sexual Activity  . Alcohol use: Yes    Alcohol/week: 0.0 oz  . Drug use: No  . Sexual activity: Yes    Birth control/protection: Surgical  Lifestyle  . Physical activity:    Days per week: Not on file    Minutes per session: Not on file  . Stress: Not on file  Relationships  . Social connections:    Talks on phone: Not on file    Gets together: Not on file    Attends religious service: Not on file    Active member  of club or organization: Not on file    Attends meetings of clubs or organizations: Not on file    Relationship status: Not on file  . Intimate partner violence:    Fear of current or ex partner: Not on file    Emotionally abused: Not on file    Physically abused: Not on file    Forced sexual activity: Not on file  Other Topics Concern  . Not on file  Social History Narrative  . Not on file    Allergies:  Allergies  Allergen Reactions  . Codeine Itching  . Ultram [Tramadol] Itching    Medications: Prior to Admission medications   Medication Sig Start Date End Date Taking? Authorizing Provider  amoxicillin-clavulanate (AUGMENTIN) 875-125 MG tablet Take 1 tablet by mouth 2 (two) times daily. 06/11/18   Glean Hess, MD  Dapsone (ACZONE) 5 % topical gel Apply topically 2 (two) times daily.    [provider]  hydrochlorothiazide (HYDRODIURIL) 25 MG tablet TAKE 1 TABLET (25 MG TOTAL) BY MOUTH DAILY. 06/06/18   Glean Hess, MD  omeprazole (PRILOSEC) 20 MG capsule Take 1 capsule (20 mg total) by mouth 2 (two) times daily before a meal. 06/11/18   Glean Hess, MD  predniSONE (DELTASONE) 10 MG tablet Take 6 on day 1, 5 on day 2, 4 on day 3, 3 on day 4, 2 on day 5 and 1 on day 1 then stop. 06/11/18   Glean Hess, MD  simvastatin (ZOCOR) 20 MG tablet Take 1 tablet (20 mg total) by mouth daily. 01/14/18   Glean Hess, MD  spironolactone (ALDACTONE) 50 MG tablet Take 50 mg by mouth daily. 07/10/17   [provider]    Physical Exam Vitals: Blood pressure 130/88, pulse 75, height 5\' 4"  (1.626 m), weight 255 lb (115.7 kg).  General: NAD HEENT: normocephalic, anicteric Thyroid: no enlargement, no palpable nodules Pulmonary: No increased work of breathing, CTAB Cardiovascular: RRR, distal pulses 2+ Breast: Breast symmetrical, no tenderness, no palpable nodules or masses, no skin or nipple retraction present, no nipple discharge.  No axillary or supraclavicular lymphadenopathy. Abdomen: NABS, soft, non-tender, non-distended.  Umbilicus without lesions.  No hepatomegaly, splenomegaly or masses palpable. No evidence of hernia  Genitourinary:  External: Normal external female genitalia.  Normal urethral meatus, normal Bartholin's and Skene's glands.    Vagina: Normal vaginal mucosa, no evidence of prolapse.    Cervix: Grossly normal in appearance, no bleeding  Uterus: surgically absent  Adnexa: ovaries non-enlarged, no adnexal masses  Rectal: deferred  Lymphatic: no evidence of inguinal lymphadenopathy Extremities: no edema, erythema, or tenderness Neurologic: Grossly intact Psychiatric: mood appropriate, affect full  Female chaperone present for pelvic and breast   portions of the physical exam    Assessment: 57 y.o. G2P2 routine annual exam  Plan: Problem List Items Addressed This Visit    None    Visit Diagnoses    Encounter for gynecological examination without abnormal finding    -  Primary   Breast screening       Screening for malignant neoplasm of cervix       Relevant Orders   Cytology - PAP      1) Mammogram - recommend yearly screening mammogram.  Mammogram Is up to date  2) STI screening  was notoffered and therefore not obtained  3) ASCCP guidelines and rational discussed.  Patient opts for yearly screening interval - if remains abnormal after LEEP last year discussed repeat  colposcopy - She really has very little cervical tissue left but would consider referral to gyn-onc to discuss trachelectomy   4) Osteoporosis  - per USPTF routine screening DEXA at age 3  5) Routine healthcare maintenance including cholesterol, diabetes screening discussed managed by PCP  6) Colonoscopy - next age 40  7) Return in about 1 year (around 06/17/2019) for annual.    Malachy Mood, MD [facility] 06/16/2018, 8:47 AM

## 2018-06-21 ENCOUNTER — Encounter: Payer: Self-pay | Admitting: Obstetrics and Gynecology

## 2018-06-21 DIAGNOSIS — K219 Gastro-esophageal reflux disease without esophagitis: Secondary | ICD-10-CM | POA: Diagnosis not present

## 2018-06-23 ENCOUNTER — Encounter: Payer: Self-pay | Admitting: Gastroenterology

## 2018-06-23 LAB — CYTOLOGY - PAP
Diagnosis: HIGH — AB
HPV 16/18/45 genotyping: NEGATIVE
HPV: DETECTED — AB

## 2018-06-24 ENCOUNTER — Encounter (INDEPENDENT_AMBULATORY_CARE_PROVIDER_SITE_OTHER): Payer: Self-pay

## 2018-06-24 ENCOUNTER — Other Ambulatory Visit: Payer: Self-pay | Admitting: Obstetrics and Gynecology

## 2018-06-24 DIAGNOSIS — R87613 High grade squamous intraepithelial lesion on cytologic smear of cervix (HGSIL): Secondary | ICD-10-CM

## 2018-07-07 ENCOUNTER — Ambulatory Visit: Payer: BLUE CROSS/BLUE SHIELD

## 2018-07-07 ENCOUNTER — Inpatient Hospital Stay: Payer: BLUE CROSS/BLUE SHIELD | Attending: Obstetrics and Gynecology | Admitting: Obstetrics and Gynecology

## 2018-07-07 DIAGNOSIS — Z90710 Acquired absence of both cervix and uterus: Secondary | ICD-10-CM | POA: Insufficient documentation

## 2018-07-07 DIAGNOSIS — R87613 High grade squamous intraepithelial lesion on cytologic smear of cervix (HGSIL): Secondary | ICD-10-CM | POA: Diagnosis not present

## 2018-07-07 NOTE — Progress Notes (Signed)
Pt has HPV in past and can't get rid of it. Had LEEP , no burning, drainage, irritation at all

## 2018-07-07 NOTE — Progress Notes (Signed)
Gynecologic Oncology Consult Visit   Referring Provider: Dr. Georgianne Fick  Chief Complaint: HSIL  Subjective:  Toni Kelly is a 58 y.o. female who is seen in consultation from Dr. Georgianne Fick for HSIL PAP.   Pap results were as follows: 05/23/2016-HSIL HPV positive with negative colposcopy (per Dr. Danielle Rankin note- results not available)  06/15/2017-ASCUS HPV positive.  Positive for high risk HPV.  07/31/2017-LEEP ectocervix.  Pathology revealed chronic cervicitis.  Negative for dysplasia, viral effect, and malignancy.  Transformation zone was not well-defined.  06/16/18- HSIL, HPV positive.  Negative for HPV 16/18/45  Per Dr. Georgianne Fick, minimal amount of cervical tissue remaining after prior hysterectomy and LEEP in 2018.  She has history of laparoscopic supracervical hysterectomy w/ Dr. Davis Gourd for painful menstrual periods in her 49s.    Last mammogram: 06/07/2018 BI-RADS Category 1  Today, she reports overall feeling well. She denies any bleeding or discharge. Denies post-coital bleeding. She does not smoke. She has some chronic pain in legs and back that is managed by PCP.   Problem List: Patient Active Problem List   Diagnosis Date Noted  . Gastroesophageal reflux disease 01/14/2018  . HSIL on Pap smear of cervix 05/23/2016  . Localized edema 10/31/2015  . Elbow tendonitis 10/31/2015  . Mixed hyperlipidemia 09/28/2015  . Anal fistula 07/21/2013    Past Medical History: Past Medical History:  Diagnosis Date  . Bursitis   . Esophageal reflux   . Hemorrhoids 2011  . HPV (human papilloma virus) infection   . Hyperlipidemia   . Hypertension     Past Surgical History: Past Surgical History:  Procedure Laterality Date  . ABDOMINAL HYSTERECTOMY    . CERVICAL BIOPSY  W/ LOOP ELECTRODE EXCISION  05/2017   [facility]  . COLONOSCOPY  2011   @ [facility]  . hemorrhoids banding  2011  . TONSILLECTOMY      Past Gynecologic History:  G2P2 History of abnormal Pap smears: Yes Last  Pap: Per HPI Contraception: Post partial hysterectomy @ ~ age 57 Sexually active: Yes History of STDs: denies  OB History:  OB History  Gravida Para Term Preterm AB Living  2 2       2   SAB TAB Ectopic Multiple Live Births               # Outcome Date GA Lbr Len/2nd Weight Sex Delivery Anes PTL Lv  2 Para           1 Para             Obstetric Comments  1st Menstrual Cycle:  15  1st Pregnancy:  73    Family History: Family History  Problem Relation Age of Onset  . Breast cancer Maternal Aunt 27  . Breast cancer Cousin 38  . Heart failure Mother   . Lung cancer Maternal Uncle   . Lung cancer Maternal Aunt   . Lung cancer Maternal Uncle     Social History: Social History   Socioeconomic History  . Marital status: Widowed    Spouse name: Not on file  . Number of children: Not on file  . Years of education: Not on file  . Highest education level: Not on file  Occupational History  . Not on file  Social Needs  . Financial resource strain: Not on file  . Food insecurity:    Worry: Not on file    Inability: Not on file  . Transportation needs:    Medical: Not on file  Non-medical: Not on file  Tobacco Use  . Smoking status: Never Smoker  . Smokeless tobacco: Never Used  Substance and Sexual Activity  . Alcohol use: Yes    Alcohol/week: 0.0 standard drinks    Comment: rarely  . Drug use: No  . Sexual activity: Yes    Birth control/protection: Surgical  Lifestyle  . Physical activity:    Days per week: Not on file    Minutes per session: Not on file  . Stress: Not on file  Relationships  . Social connections:    Talks on phone: Not on file    Gets together: Not on file    Attends religious service: Not on file    Active member of club or organization: Not on file    Attends meetings of clubs or organizations: Not on file    Relationship status: Not on file  . Intimate partner violence:    Fear of current or ex partner: Not on file    Emotionally  abused: Not on file    Physically abused: Not on file    Forced sexual activity: Not on file  Other Topics Concern  . Not on file  Social History Narrative  . Not on file    Allergies: Allergies  Allergen Reactions  . Codeine Itching  . Ultram [Tramadol] Itching    Current Medications: Current Outpatient Medications  Medication Sig Dispense Refill  . Dapsone (ACZONE) 5 % topical gel Apply topically 2 (two) times daily as needed.     . hydrochlorothiazide (HYDRODIURIL) 25 MG tablet TAKE 1 TABLET (25 MG TOTAL) BY MOUTH DAILY. 90 tablet 2  . Multiple Vitamins-Minerals (WOMENS 50+ MULTI VITAMIN/MIN PO) Take 1 tablet by mouth.    Marland Kitchen omeprazole (PRILOSEC) 20 MG capsule Take 1 capsule (20 mg total) by mouth 2 (two) times daily before a meal. 60 capsule 5  . simvastatin (ZOCOR) 20 MG tablet Take 1 tablet (20 mg total) by mouth daily. 90 tablet 3  . spironolactone (ALDACTONE) 50 MG tablet Take 50 mg by mouth daily.  6  . amoxicillin-clavulanate (AUGMENTIN) 875-125 MG tablet Take 1 tablet by mouth 2 (two) times daily. 20 tablet 0  . predniSONE (DELTASONE) 10 MG tablet Take 6 on day 1, 5 on day 2, 4 on day 3, 3 on day 4, 2 on day 5 and 1 on day 1 then stop. 21 tablet 0   No current facility-administered medications for this visit.    Review of Systems General: negative for fevers, chills, fatigue, changes in sleep, changes in weight or appetite Skin: negative for changes in color, texture, moles or lesions Eyes: negative for changes in vision, pain, diplopia HEENT: negative for change in hearing, pain, discharge, tinnitus, vertigo, voice changes, sore throat, neck masses Breasts: negative for breast lumps Pulmonary: negative for dyspnea, orthopnea, productive cough Cardiac: negative for palpitations, syncope, pain, discomfort, pressure Gastrointestinal: negative for dysphagia, nausea, vomiting, jaundice, pain, constipation, diarrhea, hematemesis, hematochezia Genitourinary/Sexual: negative  for dysuria, discharge, hesitancy, nocturia, retention, stones, infections, STD's, incontinence Ob/Gyn: negative for irregular bleeding, pain Musculoskeletal: negative for swelling, range of motion limitation. Positive for stiffness of back and pain which is chronic.  Hematology: negative for easy bruising, bleeding Neurologic/Psych: negative for headaches, seizures, paralysis, weakness, tremor, change in gait, change in sensation, mood swings, depression, anxiety, change in memory  Objective:  Physical Examination:  BP 134/86 (BP Location: Right Arm)   Pulse 76   Temp 97.6 F (36.4 C) (Tympanic)   Resp 18  Ht 5\' 4"  (1.626 m)   Wt 257 lb 4.8 oz (116.7 kg)   BMI 44.17 kg/m     ECOG Performance Status: 0 - Asymptomatic  GENERAL: Patient is a well appearing female in no acute distress HEENT:  Sclerae anicteric.  Oropharynx clear and moist. No ulcerations or evidence of oropharyngeal candidiasis. Neck is supple.  NODES:  No cervical, supraclavicular, or axillary lymphadenopathy palpated.  LUNGS:  Clear to auscultation bilaterally.  No wheezes or rhonchi. HEART:  Regular rate and rhythm. No murmur appreciated. ABDOMEN:  Soft, nontender.  Positive, normoactive bowel sounds. No organomegaly palpated. MSK:  No focal spinal tenderness to palpation. Full range of motion bilaterally in the upper extremities. EXTREMITIES:  No peripheral edema.   SKIN:  Clear with no obvious rashes or skin changes. No nail dyscrasia. NEURO:  Nonfocal. Well oriented.  Appropriate affect. BREAST: Breasts appear normal, no suspicious masses, no skin or nipple changes or axillary nodes  Pelvic: Exam Chaperoned by NP EGBUS: no lesions Cervix: flush with upper vault Vagina: no lesions, no discharge or bleeding.  The top of the vagina tapers and feels thinner. Bimanual: no masses or tenderness.  Cannot feel  Cervix. Rectovaginal: confirmatory  Colposcopy was performed after informed consent obtained.  Vagina  soaked with acetic acid and there was dimple seen, but no evidence of abnormal epithelium. She tolerated the procedure well.    Assessment:  ARCADIA GORGAS is a 57 y.o. P2 female diagnosed with persistent HSIL PAP, HR HPV s/p negative LEEP 9/18 and multiple colposcopies. She had prior supracervical hysterectomy in remote past for menorrhagia. Unclear where the HSIL cells are arising from.     Plan:   Problem List Items Addressed This Visit      Other   HSIL on Pap smear of cervix   Relevant Orders   MR PELVIS W WO CONTRAST     On exam there appears to be a bit of a sac above the vagina and this may be better defined on MRI.   In view of the persistent HSIL it may be reasonable to resect this structure above the vagina along with some of the upper vagina via an abdominal approach.  Will contact her after reviewing the MRI.  The patient's diagnosis, an outline of the further diagnostic and laboratory studies which will be required, the recommendation for surgery, and alternatives were discussed with her and her accompanying family members.  All questions were answered to their satisfaction.  A total of 40 minutes were spent with the patient/family today; 40% was spent in education, counseling and coordination of care for HSIL PAP.    Mellody Drown, MD   CC:  Glean Hess, [address] [place], [place] [address] [phone]

## 2018-07-15 ENCOUNTER — Other Ambulatory Visit: Payer: Self-pay | Admitting: Nurse Practitioner

## 2018-07-15 DIAGNOSIS — R87613 High grade squamous intraepithelial lesion on cytologic smear of cervix (HGSIL): Secondary | ICD-10-CM

## 2018-07-16 ENCOUNTER — Ambulatory Visit
Admission: RE | Admit: 2018-07-16 | Discharge: 2018-07-16 | Disposition: A | Discharge status: Home / Self Care | Payer: BLUE CROSS/BLUE SHIELD | Source: Ambulatory Visit | Attending: Nurse Practitioner | Admitting: Nurse Practitioner

## 2018-07-16 ENCOUNTER — Other Ambulatory Visit
Admission: RE | Admit: 2018-07-16 | Discharge: 2018-07-16 | Disposition: A | Discharge status: Home / Self Care | Payer: BLUE CROSS/BLUE SHIELD | Source: Ambulatory Visit | Attending: Nurse Practitioner | Admitting: Nurse Practitioner

## 2018-07-16 DIAGNOSIS — R87613 High grade squamous intraepithelial lesion on cytologic smear of cervix (HGSIL): Secondary | ICD-10-CM

## 2018-07-16 DIAGNOSIS — N83292 Other ovarian cyst, left side: Secondary | ICD-10-CM | POA: Diagnosis not present

## 2018-07-16 LAB — CREATININE, SERUM
Creatinine, Ser: 1.05 mg/dL — ABNORMAL HIGH (ref 0.44–1.00)
GFR calc Af Amer: >60 mL/min (ref >60)
GFR calc non Af Amer: 57 mL/min — ABNORMAL LOW (ref >60)

## 2018-07-16 MED ORDER — GADOBENATE DIMEGLUMINE 529 MG/ML IV SOLN
20.0000 mL | Freq: Once | INTRAVENOUS | Status: AC | PRN
  Administered 2018-07-16: 20 mL via INTRAVENOUS

## 2018-07-21 ENCOUNTER — Other Ambulatory Visit: Payer: Self-pay | Admitting: Nurse Practitioner

## 2018-07-21 NOTE — Progress Notes (Signed)
Results of MRI discussed with Dr. Fransisca Connors who recommends Premarin cream applied vaginally every other night at bedtime for next 3 months and then have patient return to clinic for repeat pelvic exam.

## 2018-07-27 ENCOUNTER — Telehealth: Payer: Self-pay | Admitting: Nurse Practitioner

## 2018-07-27 NOTE — Telephone Encounter (Signed)
Call patient to discuss results from recent MRI.  Per Dr. Fransisca Connors, he would like to use short course of Premarin 0.5g intravaginally 3 times a week at night and then have her return to clinic in approximately 3 months for repeat pelvic exam.   I would like to discuss use, risks, benefits, and appropriate application of medication further and have asked for her to return the call.

## 2018-07-28 ENCOUNTER — Other Ambulatory Visit: Payer: Self-pay | Admitting: Nurse Practitioner

## 2018-07-28 MED ORDER — ESTROGENS, CONJUGATED 0.625 MG/GM VA CREA: TOPICAL_CREAM | VAGINAL | 0 refills | Status: DC

## 2018-07-28 NOTE — Progress Notes (Signed)
Prescription for Premarin sent to pharmacy. Per Dr. Fransisca Connors, patient to insert 1 applicator full into vagina at night 3 nights per week. We will have her return to clinic in approximately 3 months for re-evaluation. Scheduling message sent to make follow-up appointment.

## 2018-07-30 DIAGNOSIS — H902 Conductive hearing loss, unspecified: Secondary | ICD-10-CM | POA: Diagnosis not present

## 2018-07-30 DIAGNOSIS — H6122 Impacted cerumen, left ear: Secondary | ICD-10-CM | POA: Diagnosis not present

## 2018-08-04 ENCOUNTER — Telehealth: Payer: Self-pay

## 2018-08-04 NOTE — Telephone Encounter (Signed)
Spoke with Ms Reitz to inform her per Dr Fransisca Connors he would like to see the patient sooner for a recheck. Her appointment has been moved from 12-19 to 08/26/18 at 3:00 pm. The patient was understanding and agreeable to come in sooner.

## 2018-08-24 HISTORY — PX: ABDOMINAL HYSTERECTOMY: SHX81

## 2018-08-25 ENCOUNTER — Inpatient Hospital Stay: Payer: BLUE CROSS/BLUE SHIELD | Attending: Obstetrics and Gynecology | Admitting: Obstetrics and Gynecology

## 2018-08-25 ENCOUNTER — Encounter: Payer: Self-pay | Admitting: Obstetrics and Gynecology

## 2018-08-25 VITALS — BP 136/74 | HR 74 | Temp 98.9°F | Resp 18 | Ht 64.0 in | Wt 257.4 lb

## 2018-08-25 DIAGNOSIS — A630 Anogenital (venereal) warts: Secondary | ICD-10-CM | POA: Insufficient documentation

## 2018-08-25 DIAGNOSIS — Z90710 Acquired absence of both cervix and uterus: Secondary | ICD-10-CM | POA: Diagnosis not present

## 2018-08-25 DIAGNOSIS — R87613 High grade squamous intraepithelial lesion on cytologic smear of cervix (HGSIL): Secondary | ICD-10-CM | POA: Diagnosis not present

## 2018-08-25 NOTE — Progress Notes (Signed)
Gynecologic Oncology Consult Visit   Referring Provider: Dr. Georgianne Fick  Chief Complaint: HSIL PAP  Subjective:  Toni Kelly is a 57 y.o. female who is seen in consultation from Dr. Georgianne Fick for HSIL PAP in patient with supracervical hysterectomy many years ago.   Patient returns today for follow up.  MRI was ordered to look at cervix more closely in view of HSIL PAP, since cervix is not palpable or visible on exam.  MRI 8/23/119 Cervix is within normal limits. Specifically, the fibrocervical stroma is intact. No findings suspicious for cervical cancer on MRI, noting that early lesions (CIN or stage IA) may be occult by imaging. Nabothian cysts. 2.3 cm left paraovarian cyst.  No right adnexal mass.  She states she has a bit of LLQ discomfort the last few days.  No bleeding or discharge.   History Pap results were as follows: 05/23/2016-HSIL HPV positive with negative colposcopy (per Dr. Danielle Rankin note- results not available)  06/15/2017-ASCUS HPV positive.  Positive for high risk HPV.  07/31/2017-LEEP ectocervix.  Pathology revealed chronic cervicitis.  Negative for dysplasia, viral effect, and malignancy.  Transformation zone was not well-defined.  06/16/18- HSIL, HPV positive.  Negative for HPV 16/18/45  Per Dr. Georgianne Fick, minimal amount of cervical tissue remaining after prior hysterectomy and LEEP in 2018.  She has history of laparoscopic supracervical hysterectomy w/ Dr. Davis Gourd for painful menstrual periods in her 78s.    Last mammogram: 06/07/2018 BI-RADS Category 1  Today, she reports overall feeling well. She denies any bleeding or discharge. Denies post-coital bleeding. She does not smoke. She has some chronic pain in legs and back that is managed by PCP.   Problem List: Patient Active Problem List   Diagnosis Date Noted  . Gastroesophageal reflux disease 01/14/2018  . HSIL on Pap smear of cervix 05/23/2016  . Localized edema 10/31/2015  . Elbow tendonitis 10/31/2015   . Mixed hyperlipidemia 09/28/2015  . Anal fistula 07/21/2013    Past Medical History: Past Medical History:  Diagnosis Date  . Bursitis   . Esophageal reflux   . Hemorrhoids 2011  . HPV (human papilloma virus) infection   . Hyperlipidemia   . Hypertension     Past Surgical History: Past Surgical History:  Procedure Laterality Date  . ABDOMINAL HYSTERECTOMY    . CERVICAL BIOPSY  W/ LOOP ELECTRODE EXCISION  05/2017   [facility]  . COLONOSCOPY  2011   @ [facility]  . hemorrhoids banding  2011  . TONSILLECTOMY      Past Gynecologic History:  G2P2 History of abnormal Pap smears: Yes Last Pap: Per HPI Contraception: Post partial hysterectomy @ ~ age 68 Sexually active: Yes History of STDs: denies  OB History:  OB History  Gravida Para Term Preterm AB Living  2 2       2   SAB TAB Ectopic Multiple Live Births               # Outcome Date GA Lbr Len/2nd Weight Sex Delivery Anes PTL Lv  2 Para           1 Para             Obstetric Comments  1st Menstrual Cycle:  15  1st Pregnancy:  39    Family History: Family History  Problem Relation Age of Onset  . Breast cancer Maternal Aunt 63  . Breast cancer Cousin 44  . Heart failure Mother   . Lung cancer Maternal Uncle   . Lung cancer Maternal  Aunt   . Lung cancer Maternal Uncle     Social History: Social History   Socioeconomic History  . Marital status: Widowed    Spouse name: Not on file  . Number of children: Not on file  . Years of education: Not on file  . Highest education level: Not on file  Occupational History  . Not on file  Social Needs  . Financial resource strain: Not on file  . Food insecurity:    Worry: Not on file    Inability: Not on file  . Transportation needs:    Medical: Not on file    Non-medical: Not on file  Tobacco Use  . Smoking status: Never Smoker  . Smokeless tobacco: Never Used  Substance and Sexual Activity  . Alcohol use: Yes    Alcohol/week: 0.0 standard drinks     Comment: rarely  . Drug use: No  . Sexual activity: Yes    Birth control/protection: Surgical  Lifestyle  . Physical activity:    Days per week: Not on file    Minutes per session: Not on file  . Stress: Not on file  Relationships  . Social connections:    Talks on phone: Not on file    Gets together: Not on file    Attends religious service: Not on file    Active member of club or organization: Not on file    Attends meetings of clubs or organizations: Not on file    Relationship status: Not on file  . Intimate partner violence:    Fear of current or ex partner: Not on file    Emotionally abused: Not on file    Physically abused: Not on file    Forced sexual activity: Not on file  Other Topics Concern  . Not on file  Social History Narrative  . Not on file    Allergies: Allergies  Allergen Reactions  . Codeine Itching  . Ultram [Tramadol] Itching    Current Medications: Current Outpatient Medications  Medication Sig Dispense Refill  . conjugated estrogens (PREMARIN) vaginal cream Place 1 applicator full into vagina at bedtime three nights per week. 30 g 0  . Dapsone (ACZONE) 5 % topical gel Apply topically 2 (two) times daily as needed.     . hydrochlorothiazide (HYDRODIURIL) 25 MG tablet TAKE 1 TABLET (25 MG TOTAL) BY MOUTH DAILY. 90 tablet 2  . Multiple Vitamins-Minerals (WOMENS 50+ MULTI VITAMIN/MIN PO) Take 1 tablet by mouth.    Marland Kitchen omeprazole (PRILOSEC) 20 MG capsule Take 1 capsule (20 mg total) by mouth 2 (two) times daily before a meal. 60 capsule 5  . simvastatin (ZOCOR) 20 MG tablet Take 1 tablet (20 mg total) by mouth daily. 90 tablet 3  . spironolactone (ALDACTONE) 50 MG tablet Take 50 mg by mouth daily.  6  . predniSONE (DELTASONE) 10 MG tablet Take 6 on day 1, 5 on day 2, 4 on day 3, 3 on day 4, 2 on day 5 and 1 on day 1 then stop. 21 tablet 0   No current facility-administered medications for this visit.    Review of Systems General: negative for fevers,  chills, fatigue, changes in sleep, changes in weight or appetite Skin: negative for changes in color, texture, moles or lesions Eyes: negative for changes in vision, pain, diplopia HEENT: negative for change in hearing, pain, discharge, tinnitus, vertigo, voice changes, sore throat, neck masses Breasts: negative for breast lumps Pulmonary: negative for dyspnea, orthopnea, productive cough Cardiac:  negative for palpitations, syncope, pain, discomfort, pressure Gastrointestinal: negative for dysphagia, nausea, vomiting, jaundiceconstipation, diarrhea, hematemesis, hematochezia Genitourinary/Sexual: negative for dysuria, discharge, hesitancy, nocturia, retention, stones, infections, STD's, incontinence Ob/Gyn: negative for irregular bleeding  Musculoskeletal: negative for swelling, range of motion limitation. Positive for stiffness of back and pain which is chronic.  Hematology: negative for easy bruising, bleeding Neurologic/Psych: negative for headaches, seizures, paralysis, weakness, tremor, change in gait, change in sensation, mood swings, depression, anxiety, change in memory  Objective:  Physical Examination:  BP 136/74   Pulse 74   Temp 98.9 F (37.2 C) (Tympanic)   Resp 18   Ht 5\' 4"  (1.626 m)   Wt 257 lb 6.4 oz (116.8 kg)   BMI 44.18 kg/m     ECOG Performance Status: 0 - Asymptomatic  GENERAL: Patient is a well appearing female in no acute distress HEENT:  Sclerae anicteric.  Oropharynx clear and moist. No ulcerations or evidence of oropharyngeal candidiasis. Neck is supple.  NODES:  No cervical, supraclavicular, or axillary lymphadenopathy palpated.  LUNGS:  Clear to auscultation bilaterally.  No wheezes or rhonchi. HEART:  Regular rate and rhythm. No murmur appreciated. ABDOMEN:  Soft, nontender.  Positive, normoactive bowel sounds. No organomegaly palpated. MSK:  No focal spinal tenderness to palpation. Full range of motion bilaterally in the upper  extremities. EXTREMITIES:  No peripheral edema.   SKIN:  Clear with no obvious rashes or skin changes. No nail dyscrasia. NEURO:  Nonfocal. Well oriented.  Appropriate affect. BREAST: not examined  Pelvic: Exam Chaperoned by NP EGBUS: no lesions Cervix: cannot be seen or felt. Vagina: no lesions, no discharge or bleeding.  The top of the vagina tapers and is very narrow. Bimanual: no masses or tenderness.  Cannot feel cervix. Rectovaginal: confirmatory.  No masses.    Assessment:  BRESHAE BELCHER is a 57 y.o. P2 female diagnosed with persistent HSIL PAP, HR HPV s/p negative LEEP 9/18 and multiple colposcopies. PAP 7/19 HSIL/HPV negative.  She had prior supracervical hysterectomy in remote past for menorrhagia with preservation of ovaries. Unclear where the HSIL cells are arising from.  MRI shows that cervix is still present, even though not palpable on exam.  No concern for cancer on MRI and asymptomatic.  MRI also shows small left paraovarian cyst.      Plan:   Problem List Items Addressed This Visit      Other   HSIL on Pap smear of cervix - Primary     Told the patient that I would recommend trachelectomy in view of persistent HSIL PAP and no way to evaluate the cervix.  Do not think this procedure can be done vaginally and will likely require abdominal approach, but this can be better evaluated at the time of surgery.  In view of the complexity of this operation would like to do the surgery at [facility].   The patient's diagnosis, an outline of the further diagnostic and laboratory studies which will be required, the recommendation for surgery, and alternatives were discussed with her.  All questions were answered to their satisfaction.  Mellody Drown, MD  CC:  Glean Hess, [address] [place], [place] [address] [phone]

## 2018-08-25 NOTE — Progress Notes (Signed)
Pt has been notcing off an on discomfort on her left side. She also says that she has been hurting on left elbox to left shoulder and she has appt with ortho to check on it.

## 2018-08-26 DIAGNOSIS — M5136 Other intervertebral disc degeneration, lumbar region: Secondary | ICD-10-CM | POA: Insufficient documentation

## 2018-08-26 DIAGNOSIS — M5412 Radiculopathy, cervical region: Secondary | ICD-10-CM | POA: Diagnosis not present

## 2018-08-26 DIAGNOSIS — M5030 Other cervical disc degeneration, unspecified cervical region: Secondary | ICD-10-CM | POA: Insufficient documentation

## 2018-08-26 DIAGNOSIS — M79601 Pain in right arm: Secondary | ICD-10-CM | POA: Diagnosis not present

## 2018-08-26 DIAGNOSIS — M79602 Pain in left arm: Secondary | ICD-10-CM | POA: Diagnosis not present

## 2018-08-26 NOTE — Progress Notes (Signed)
Notified Dr. Blake Divine team at [facility] to arrange for OV and trachelectomy. Oncology Nurse Navigator Documentation  Navigator Location: [facility] (08/26/18 0900)   )Navigator Encounter Type: Letter/Fax/Email (08/26/18 0900)                                                    Time Spent with Patient: 15 (08/26/18 0900)

## 2018-09-10 DIAGNOSIS — R87613 High grade squamous intraepithelial lesion on cytologic smear of cervix (HGSIL): Secondary | ICD-10-CM | POA: Diagnosis not present

## 2018-09-10 DIAGNOSIS — R9431 Abnormal electrocardiogram [ECG] [EKG]: Secondary | ICD-10-CM | POA: Diagnosis not present

## 2018-09-14 DIAGNOSIS — R9431 Abnormal electrocardiogram [ECG] [EKG]: Secondary | ICD-10-CM | POA: Diagnosis not present

## 2018-09-14 DIAGNOSIS — Z6841 Body Mass Index (BMI) 40.0 and over, adult: Secondary | ICD-10-CM | POA: Diagnosis not present

## 2018-09-15 DIAGNOSIS — R9431 Abnormal electrocardiogram [ECG] [EKG]: Secondary | ICD-10-CM | POA: Diagnosis not present

## 2018-09-15 DIAGNOSIS — Z01810 Encounter for preprocedural cardiovascular examination: Secondary | ICD-10-CM | POA: Diagnosis not present

## 2018-09-21 DIAGNOSIS — R87613 High grade squamous intraepithelial lesion on cytologic smear of cervix (HGSIL): Secondary | ICD-10-CM | POA: Diagnosis not present

## 2018-09-21 DIAGNOSIS — D259 Leiomyoma of uterus, unspecified: Secondary | ICD-10-CM | POA: Diagnosis not present

## 2018-09-21 DIAGNOSIS — N895 Stricture and atresia of vagina: Secondary | ICD-10-CM | POA: Diagnosis not present

## 2018-09-21 DIAGNOSIS — M5030 Other cervical disc degeneration, unspecified cervical region: Secondary | ICD-10-CM | POA: Diagnosis not present

## 2018-09-21 DIAGNOSIS — N838 Other noninflammatory disorders of ovary, fallopian tube and broad ligament: Secondary | ICD-10-CM | POA: Diagnosis not present

## 2018-09-21 DIAGNOSIS — G8918 Other acute postprocedural pain: Secondary | ICD-10-CM | POA: Diagnosis not present

## 2018-09-21 DIAGNOSIS — M7710 Lateral epicondylitis, unspecified elbow: Secondary | ICD-10-CM | POA: Diagnosis not present

## 2018-09-21 DIAGNOSIS — Z885 Allergy status to narcotic agent status: Secondary | ICD-10-CM | POA: Diagnosis not present

## 2018-09-21 DIAGNOSIS — N879 Dysplasia of cervix uteri, unspecified: Secondary | ICD-10-CM | POA: Diagnosis not present

## 2018-09-21 DIAGNOSIS — N7011 Chronic salpingitis: Secondary | ICD-10-CM | POA: Diagnosis not present

## 2018-09-21 DIAGNOSIS — Z79899 Other long term (current) drug therapy: Secondary | ICD-10-CM | POA: Diagnosis not present

## 2018-09-21 DIAGNOSIS — Z90711 Acquired absence of uterus with remaining cervical stump: Secondary | ICD-10-CM | POA: Diagnosis not present

## 2018-09-21 DIAGNOSIS — D235 Other benign neoplasm of skin of trunk: Secondary | ICD-10-CM | POA: Diagnosis not present

## 2018-09-21 DIAGNOSIS — N83202 Unspecified ovarian cyst, left side: Secondary | ICD-10-CM | POA: Diagnosis not present

## 2018-09-21 DIAGNOSIS — N882 Stricture and stenosis of cervix uteri: Secondary | ICD-10-CM | POA: Diagnosis not present

## 2018-09-21 DIAGNOSIS — Z6841 Body Mass Index (BMI) 40.0 and over, adult: Secondary | ICD-10-CM | POA: Diagnosis not present

## 2018-09-21 DIAGNOSIS — I10 Essential (primary) hypertension: Secondary | ICD-10-CM | POA: Diagnosis not present

## 2018-09-21 DIAGNOSIS — N83291 Other ovarian cyst, right side: Secondary | ICD-10-CM | POA: Diagnosis not present

## 2018-09-21 DIAGNOSIS — N736 Female pelvic peritoneal adhesions (postinfective): Secondary | ICD-10-CM | POA: Diagnosis not present

## 2018-09-21 DIAGNOSIS — M7550 Bursitis of unspecified shoulder: Secondary | ICD-10-CM | POA: Diagnosis not present

## 2018-09-22 DIAGNOSIS — M7550 Bursitis of unspecified shoulder: Secondary | ICD-10-CM | POA: Diagnosis not present

## 2018-09-22 DIAGNOSIS — Z90711 Acquired absence of uterus with remaining cervical stump: Secondary | ICD-10-CM | POA: Diagnosis not present

## 2018-09-22 DIAGNOSIS — I10 Essential (primary) hypertension: Secondary | ICD-10-CM | POA: Diagnosis not present

## 2018-09-22 DIAGNOSIS — N736 Female pelvic peritoneal adhesions (postinfective): Secondary | ICD-10-CM | POA: Diagnosis not present

## 2018-09-22 DIAGNOSIS — Z79899 Other long term (current) drug therapy: Secondary | ICD-10-CM | POA: Diagnosis not present

## 2018-09-22 DIAGNOSIS — Z885 Allergy status to narcotic agent status: Secondary | ICD-10-CM | POA: Diagnosis not present

## 2018-09-22 DIAGNOSIS — N879 Dysplasia of cervix uteri, unspecified: Secondary | ICD-10-CM | POA: Diagnosis not present

## 2018-09-22 DIAGNOSIS — Z6841 Body Mass Index (BMI) 40.0 and over, adult: Secondary | ICD-10-CM | POA: Diagnosis not present

## 2018-09-22 DIAGNOSIS — N7011 Chronic salpingitis: Secondary | ICD-10-CM | POA: Diagnosis not present

## 2018-09-22 DIAGNOSIS — N895 Stricture and atresia of vagina: Secondary | ICD-10-CM | POA: Diagnosis not present

## 2018-09-22 DIAGNOSIS — N838 Other noninflammatory disorders of ovary, fallopian tube and broad ligament: Secondary | ICD-10-CM | POA: Diagnosis not present

## 2018-09-22 DIAGNOSIS — M7710 Lateral epicondylitis, unspecified elbow: Secondary | ICD-10-CM | POA: Diagnosis not present

## 2018-09-22 DIAGNOSIS — M5030 Other cervical disc degeneration, unspecified cervical region: Secondary | ICD-10-CM | POA: Diagnosis not present

## 2018-09-22 DIAGNOSIS — N83291 Other ovarian cyst, right side: Secondary | ICD-10-CM | POA: Diagnosis not present

## 2018-09-22 DIAGNOSIS — D235 Other benign neoplasm of skin of trunk: Secondary | ICD-10-CM | POA: Diagnosis not present

## 2018-09-23 DIAGNOSIS — Z885 Allergy status to narcotic agent status: Secondary | ICD-10-CM | POA: Diagnosis not present

## 2018-09-23 DIAGNOSIS — M5030 Other cervical disc degeneration, unspecified cervical region: Secondary | ICD-10-CM | POA: Diagnosis not present

## 2018-09-23 DIAGNOSIS — Z6841 Body Mass Index (BMI) 40.0 and over, adult: Secondary | ICD-10-CM | POA: Diagnosis not present

## 2018-09-23 DIAGNOSIS — N838 Other noninflammatory disorders of ovary, fallopian tube and broad ligament: Secondary | ICD-10-CM | POA: Diagnosis not present

## 2018-09-23 DIAGNOSIS — D235 Other benign neoplasm of skin of trunk: Secondary | ICD-10-CM | POA: Diagnosis not present

## 2018-09-23 DIAGNOSIS — Z79899 Other long term (current) drug therapy: Secondary | ICD-10-CM | POA: Diagnosis not present

## 2018-09-23 DIAGNOSIS — M7710 Lateral epicondylitis, unspecified elbow: Secondary | ICD-10-CM | POA: Diagnosis not present

## 2018-09-23 DIAGNOSIS — N83291 Other ovarian cyst, right side: Secondary | ICD-10-CM | POA: Diagnosis not present

## 2018-09-23 DIAGNOSIS — M7550 Bursitis of unspecified shoulder: Secondary | ICD-10-CM | POA: Diagnosis not present

## 2018-09-23 DIAGNOSIS — N736 Female pelvic peritoneal adhesions (postinfective): Secondary | ICD-10-CM | POA: Diagnosis not present

## 2018-09-23 DIAGNOSIS — N879 Dysplasia of cervix uteri, unspecified: Secondary | ICD-10-CM | POA: Diagnosis not present

## 2018-09-23 DIAGNOSIS — N7011 Chronic salpingitis: Secondary | ICD-10-CM | POA: Diagnosis not present

## 2018-09-23 DIAGNOSIS — I10 Essential (primary) hypertension: Secondary | ICD-10-CM | POA: Diagnosis not present

## 2018-09-23 DIAGNOSIS — N895 Stricture and atresia of vagina: Secondary | ICD-10-CM | POA: Diagnosis not present

## 2018-09-23 DIAGNOSIS — Z90711 Acquired absence of uterus with remaining cervical stump: Secondary | ICD-10-CM | POA: Diagnosis not present

## 2018-09-24 ENCOUNTER — Telehealth: Payer: Self-pay

## 2018-09-24 NOTE — Telephone Encounter (Signed)
Called and notified Ms. Hickel of her post operative appointment 11/27 at 1030 with Dr. Fransisca Connors. Oncology Nurse Navigator Documentation  Navigator Location: [facility] (09/24/18 1000)   )Navigator Encounter Type: Telephone (09/24/18 1000) Telephone: Lahoma Crocker Call;Appt Confirmation/Clarification (09/24/18 1000)                                                  Time Spent with Patient: 15 (09/24/18 1000)

## 2018-10-20 ENCOUNTER — Inpatient Hospital Stay: Payer: BLUE CROSS/BLUE SHIELD | Attending: Obstetrics and Gynecology | Admitting: Obstetrics and Gynecology

## 2018-10-20 VITALS — BP 137/82 | HR 93 | Temp 98.0°F | Resp 16 | Ht 64.0 in | Wt 250.1 lb

## 2018-10-20 DIAGNOSIS — Z90710 Acquired absence of both cervix and uterus: Secondary | ICD-10-CM | POA: Insufficient documentation

## 2018-10-20 DIAGNOSIS — Z90722 Acquired absence of ovaries, bilateral: Secondary | ICD-10-CM | POA: Insufficient documentation

## 2018-10-20 DIAGNOSIS — R87613 High grade squamous intraepithelial lesion on cytologic smear of cervix (HGSIL): Secondary | ICD-10-CM | POA: Insufficient documentation

## 2018-10-20 NOTE — Progress Notes (Signed)
Gynecologic Oncology Consult Visit   Referring Provider: Dr. Georgianne Fick  Chief Complaint: HSIL PAP, post op exam  Subjective:  GRACIANA Kelly is a 57 y.o. female, initially seen in consultation from Dr. Georgianne Fick for HSIL PAP s/p supracervical hysterectomy.  She underwent surgery 3 weeks ago and cervix could not be removed vaginally or laparoscopically, so she had laparotomy with cherney incision, trachelectomy, BSO, lysis of adhesions, with Dr. Fransisca Connors at [facility] on 09/21/18.  There was a long cervical remnant still present and this was clearly completely removed.     She returns to clinic today for discussion of pathology and post-operative evaluation. Having some incisional pain, fatigue, constipation and 7 lb weight loss.  She takes senna.  PATHOLOGY:  A.  Periumbilical nodule: Dermatofibroma.  B.  Right fallopian tube and ovary: Ovary: Cortical inclusion cyst. Fallopian tube: Paratubal cyst.  C.  Left fallopian tube and ovary: Ovary: No specific pathologic change.  Fallopian tube: Paratubal cysts (up to 2.9 cm).  D.  Cervix: Squamous metaplasia. Leiomyoma (0.3 cm). There is no evidence of malignancy. (Levels examined.)  History Pap results were as follows: 05/23/2016-HSIL HPV positive with negative colposcopy (per Dr. Danielle Rankin note- results not available)  06/15/2017-ASCUS HPV positive.  Positive for high risk HPV.  07/31/2017-LEEP ectocervix.  Pathology revealed chronic cervicitis.  Negative for dysplasia, viral effect, and malignancy.  Transformation zone was not well-defined.  06/16/18- HSIL, HPV positive.  Negative for HPV 16/18/45  Per Dr. Georgianne Fick, minimal amount of cervical tissue remaining after prior hysterectomy and LEEP in 2018.  She has history of laparoscopic supracervical hysterectomy w/ Dr. Davis Gourd for painful menstrual periods in her 22s.    Last mammogram: 06/07/2018 BI-RADS Category 1  She denies any bleeding or discharge. Denies post-coital bleeding. She  does not smoke. She has some chronic pain in legs and back that is managed by PCP.   MRI 8/23/119- to evaluate cervix more closely in view of HSIL PAP Cervix is within normal limits. Specifically, the fibrocervical stroma is intact. No findings suspicious for cervical cancer on MRI, noting that early lesions (CIN or stage IA) may be occult by imaging. Nabothian cysts. 2.3 cm left paraovarian cyst.  No right adnexal mass.  Problem List: Patient Active Problem List   Diagnosis Date Noted  . Gastroesophageal reflux disease 01/14/2018  . HSIL on Pap smear of cervix 05/23/2016  . Localized edema 10/31/2015  . Elbow tendonitis 10/31/2015  . Mixed hyperlipidemia 09/28/2015  . Anal fistula 07/21/2013    Past Medical History: Past Medical History:  Diagnosis Date  . Bursitis   . Esophageal reflux   . Hemorrhoids 2011  . HPV (human papilloma virus) infection   . Hyperlipidemia   . Hypertension     Past Surgical History: Past Surgical History:  Procedure Laterality Date  . ABDOMINAL HYSTERECTOMY    . CERVICAL BIOPSY  W/ LOOP ELECTRODE EXCISION  05/2017   [facility]  . COLONOSCOPY  2011   @ [facility]  . hemorrhoids banding  2011  . TONSILLECTOMY      Past Gynecologic History:  G2P2 History of abnormal Pap smears: Yes Last Pap: Per HPI Contraception: Post partial hysterectomy @ ~ age 50 Sexually active: Yes History of STDs: denies  OB History:  OB History  Gravida Para Term Preterm AB Living  2 2       2   SAB TAB Ectopic Multiple Live Births               # Outcome Date  GA Lbr Len/2nd Weight Sex Delivery Anes PTL Lv  2 Para           1 Para             Obstetric Comments  1st Menstrual Cycle:  15  1st Pregnancy:  54    Family History: Family History  Problem Relation Age of Onset  . Breast cancer Maternal Aunt 12  . Breast cancer Cousin 19  . Heart failure Mother   . Lung cancer Maternal Uncle   . Lung cancer Maternal Aunt   . Lung cancer Maternal Uncle      Social History: Social History   Socioeconomic History  . Marital status: Widowed    Spouse name: Not on file  . Number of children: Not on file  . Years of education: Not on file  . Highest education level: Not on file  Occupational History  . Not on file  Social Needs  . Financial resource strain: Not on file  . Food insecurity:    Worry: Not on file    Inability: Not on file  . Transportation needs:    Medical: Not on file    Non-medical: Not on file  Tobacco Use  . Smoking status: Never Smoker  . Smokeless tobacco: Never Used  Substance and Sexual Activity  . Alcohol use: Yes    Alcohol/week: 0.0 standard drinks    Comment: rarely  . Drug use: No  . Sexual activity: Yes    Birth control/protection: Surgical  Lifestyle  . Physical activity:    Days per week: Not on file    Minutes per session: Not on file  . Stress: Not on file  Relationships  . Social connections:    Talks on phone: Not on file    Gets together: Not on file    Attends religious service: Not on file    Active member of club or organization: Not on file    Attends meetings of clubs or organizations: Not on file    Relationship status: Not on file  . Intimate partner violence:    Fear of current or ex partner: Not on file    Emotionally abused: Not on file    Physically abused: Not on file    Forced sexual activity: Not on file  Other Topics Concern  . Not on file  Social History Narrative  . Not on file    Allergies: Allergies  Allergen Reactions  . Codeine Itching  . Oxycodone Itching  . Ultram [Tramadol] Itching    Current Medications: Current Outpatient Medications  Medication Sig Dispense Refill  . Dapsone (ACZONE) 5 % topical gel Apply topically 2 (two) times daily as needed.     . hydrochlorothiazide (HYDRODIURIL) 25 MG tablet TAKE 1 TABLET (25 MG TOTAL) BY MOUTH DAILY. 90 tablet 2  . Multiple Vitamins-Minerals (WOMENS 50+ MULTI VITAMIN/MIN PO) Take 1 tablet by mouth.     Marland Kitchen omeprazole (PRILOSEC) 20 MG capsule Take 1 capsule (20 mg total) by mouth 2 (two) times daily before a meal. 60 capsule 5  . senna-docusate (SENOKOT-S) 8.6-50 MG tablet Take 2 tablets by mouth 2 (two) times daily.    . simvastatin (ZOCOR) 20 MG tablet Take 1 tablet (20 mg total) by mouth daily. 90 tablet 3   No current facility-administered medications for this visit.     Review of Systems General:  no complaints Skin: no complaints Eyes: no complaints HEENT: no complaints Breasts: no complaints Pulmonary: no complaints Cardiac:  no complaints Gastrointestinal: no complaints Genitourinary/Sexual: no complaints Ob/Gyn: no complaints Musculoskeletal: no complaints Hematology: no complaints Neurologic/Psych: no complaints   Objective:  Physical Examination:  BP 137/82 (BP Location: Left Arm, Patient Position: Sitting)   Pulse 93   Temp 98 F (36.7 C) (Tympanic)   Resp 16   Ht 5\' 4"  (1.626 m)   Wt 250 lb 1.6 oz (113.4 kg)   BMI 42.93 kg/m     ECOG Performance Status: 0 - Asymptomatic  GENERAL: Patient is a well appearing female in no acute distress HEENT:  PERRL, neck supple with midline trachea. Thyroid without masses.  NODES:  No cervical, supraclavicular, axillary, or inguinal lymphadenopathy palpated.  LUNGS:  Clear to auscultation bilaterally.  No wheezes or rhonchi. HEART:  Regular rate and rhythm. No murmur appreciated. ABDOMEN:  Soft, nontender.  Positive, normoactive bowel sounds.  MSK:  No focal spinal tenderness to palpation. Full range of motion bilaterally in the upper extremities. EXTREMITIES:  No peripheral edema.   SKIN:  Clear with no obvious rashes or skin changes. No nail dyscrasia. NEURO:  Nonfocal. Well oriented.  Appropriate affect.  Pelvic: Exam Chaperoned by NP EGBUS: no lesions Vagina: There is a fibrous circumferential band above the mid vagina and the top of the vagina tapers a bit. No lesions seen, but not able to see the top of the vagina.   Bimanual: no masses or tenderness.  Top of vagina intact.  Rectovaginal: deferred.    Assessment:  Toni Kelly is a 57 y.o. P2 female diagnosed with persistent HSIL PAP, HR HPV s/p negative LEEP 9/18 and multiple colposcopies. PAP 7/19 HSIL/HPV negative.  She had prior supracervical hysterectomy in remote past for menorrhagia with preservation of ovaries.   Unclear where the HSIL cells were arising and MRI showed normal cervix.  Long residual cervix removed completely 09/21/18 via Cheney incision.  Upper vaginal tissue looked normal after cervix amputated.  Pathology did not show any evidence of HSIL.  Small left paraovarian cyst associated with pain and removed with BSO and there was benign pathology.    Normal post op exam today.       Plan:   Problem List Items Addressed This Visit      Other   HSIL on Pap smear of cervix - Primary     The cervix was clearly removed completely and it was presumed that this was where the abnormal cells on PAP orignated, but no HSIL was found on pathology.  There is a fibrous circumferential band above the mid vagina and the top of the vagina tapers a bit. No lesions seen, but not able to see the top of the vagina well.  Will have her RTC in 2 months for repeat PAP smear to determine whether HSIL cells are still present.  There could have just been a small focus of  HSIL that was not seen on path, but it is possible that the abnormal cells are coming from the vagina.  No obvious lesion has been seen in the vagina, but would try to attempt vaginal colposcopy although hard to see the apex due to the vaginal scar tissue.  The patient's diagnosis, an outline of the further diagnostic and laboratory studies which will be required, the recommendation for surgery, and alternatives were discussed with her.  All questions were answered to their satisfaction.  Beckey Rutter, DNP, AGNP-C [facility] [phone] (work cell) [phone]  (office)  I personally interviewed and examined the patient. Agreed  with the above/below plan of care. Patient/family questions were answered.  Mellody Drown, MD  CC:  Glean Hess, [address] [place], [place] [address] [phone]

## 2018-10-20 NOTE — Progress Notes (Signed)
Pt. Reports constipation and is taking Senna-S BID and still takes approx. 3 days to have BM. Reports this was present prior to [place]. Still having fatigue and appetite decreased. Pt. States she is unable to sit for long d/t bottom being sore.

## 2018-10-27 ENCOUNTER — Ambulatory Visit: Payer: BLUE CROSS/BLUE SHIELD

## 2018-11-04 ENCOUNTER — Encounter: Payer: Self-pay | Admitting: Internal Medicine

## 2018-11-22 ENCOUNTER — Telehealth: Payer: Self-pay

## 2018-11-22 NOTE — Telephone Encounter (Signed)
Received notification that Toni Kelly is having hot flashes and would like to know what can be done for that. Surgery 09/21/18.  PROCEDURE: Laparoscopic bilateral salpingo-oophorectomy Conversion to exploratory laparotomy Trachelectomy

## 2018-11-22 NOTE — Telephone Encounter (Signed)
I can see her in Symptom Management to discuss management options (likely clonidine vs effexor) if she would like or if Dr. Fransisca Connors has specific recommendation, we can call something in for her.

## 2018-12-01 ENCOUNTER — Inpatient Hospital Stay: Payer: BLUE CROSS/BLUE SHIELD | Attending: Obstetrics and Gynecology | Admitting: Obstetrics and Gynecology

## 2018-12-01 ENCOUNTER — Other Ambulatory Visit: Payer: Self-pay

## 2018-12-01 VITALS — BP 173/88 | HR 79 | Temp 98.4°F | Resp 16 | Ht 64.0 in | Wt 253.9 lb

## 2018-12-01 DIAGNOSIS — Z90710 Acquired absence of both cervix and uterus: Secondary | ICD-10-CM | POA: Insufficient documentation

## 2018-12-01 DIAGNOSIS — Z90722 Acquired absence of ovaries, bilateral: Secondary | ICD-10-CM | POA: Insufficient documentation

## 2018-12-01 DIAGNOSIS — R87613 High grade squamous intraepithelial lesion on cytologic smear of cervix (HGSIL): Secondary | ICD-10-CM | POA: Insufficient documentation

## 2018-12-01 MED ORDER — VENLAFAXINE HCL 37.5 MG PO TABS: 37.5000 mg | ORAL_TABLET | Freq: Two times a day (BID) | ORAL | 0 refills | Status: DC

## 2018-12-01 MED ORDER — NYSTATIN 100000 UNIT/GM EX POWD: Freq: Four times a day (QID) | CUTANEOUS | 0 refills | Status: DC

## 2018-12-01 NOTE — Patient Instructions (Signed)
Toni Kelly,   It is common for patients who are undergoing treatment and taking certain prescribed medications to experience side-effects with constipation.  If you experience constipation, please take stool softeners such as Senna and/or Miralax every day to avoid constipation.  These medications are available over the counter.  Of course, if you have diarrhea, stop taking stool softeners.  Drinking plenty of fluid, eating fruits and vegetable, and being active also reduces the risk of constipation. Start with senna (2 pills once a day) with one dose/cap of miralax once a day. If you continue to experience pain or straining increase this regimen to twice a day.   If despite taking stool softeners, and you still have no bowel movement for 2 days or more than your normal bowel habit frequency, please take one of the following over the counter laxatives:  Milk of Magnesia or Mag Citrate everyday and contact me immediately for further instructions.  The goal is to have at least one bowel movement every day or every other day without pain or straining.   Venlafaxine tablets What is this medicine? VENLAFAXINE (VEN la fax een) is used to treat depression, anxiety and panic disorder. This medicine may be used for other purposes; ask your health care provider or pharmacist if you have questions. COMMON BRAND NAME(S): Effexor What should I tell my health care provider before I take this medicine? They need to know if you have any of these conditions: -bleeding disorders -glaucoma -heart disease -high blood pressure -high cholesterol -kidney disease -liver disease -low levels of sodium in the blood -mania or bipolar disorder -seizures -suicidal thoughts, plans, or attempt; a previous suicide attempt by you or a family -take medicines that treat or prevent blood clots -thyroid disease -an unusual or allergic reaction to venlafaxine, desvenlafaxine, other medicines, foods, dyes, or  preservatives -pregnant or trying to get pregnant -breast-feeding How should I use this medicine? Take this medicine by mouth with a glass of water. Follow the directions on the prescription label. Take it with food. Take your medicine at regular intervals. Do not take your medicine more often than directed. Do not stop taking this medicine suddenly except upon the advice of your doctor. Stopping this medicine too quickly may cause serious side effects or your condition may worsen. A special MedGuide will be given to you by the pharmacist with each prescription and refill. Be sure to read this information carefully each time. Talk to your pediatrician regarding the use of this medicine in children. Special care may be needed. Overdosage: If you think you have taken too much of this medicine contact a poison control center or emergency room at once. NOTE: This medicine is only for you. Do not share this medicine with others. What if I miss a dose? If you miss a dose, take it as soon as you can. If it is almost time for your next dose, take only that dose. Do not take double or extra doses. What may interact with this medicine? Do not take this medicine with any of the following medications: -certain medicines for fungal infections like fluconazole, itraconazole, ketoconazole, posaconazole, voriconazole -cisapride -desvenlafaxine -dofetilide -dronedarone -duloxetine -levomilnacipran -linezolid -MAOIs like Carbex, Eldepryl, Marplan, Nardil, and Parnate -methylene blue (injected into a vein) -milnacipran -pimozide -thioridazine -ziprasidone This medicine may also interact with the following medications: -amphetamines -aspirin and aspirin-like medicines -certain medicines for depression, anxiety, or psychotic disturbances -certain medicines for migraine headaches like almotriptan, eletriptan, frovatriptan, naratriptan, rizatriptan, sumatriptan, zolmitriptan -certain medicines for  sleep -certain medicines that treat or prevent blood clots like dalteparin, enoxaparin, warfarin -cimetidine -clozapine -diuretics -fentanyl -furazolidone -indinavir -isoniazid -lithium -metoprolol -NSAIDS, medicines for pain and inflammation, like ibuprofen or naproxen -other medicines that prolong the QT interval (cause an abnormal heart rhythm) -procarbazine -rasagiline -supplements like St. John's wort, kava kava, valerian -tramadol -tryptophan This list may not describe all possible interactions. Give your health care provider a list of all the medicines, herbs, non-prescription drugs, or dietary supplements you use. Also tell them if you smoke, drink alcohol, or use illegal drugs. Some items may interact with your medicine. What should I watch for while using this medicine? Tell your doctor if your symptoms do not get better or if they get worse. Visit your doctor or health care professional for regular checks on your progress. Because it may take several weeks to see the full effects of this medicine, it is important to continue your treatment as prescribed by your doctor. Patients and their families should watch out for new or worsening thoughts of suicide or depression. Also watch out for sudden changes in feelings such as feeling anxious, agitated, panicky, irritable, hostile, aggressive, impulsive, severely restless, overly excited and hyperactive, or not being able to sleep. If this happens, especially at the beginning of treatment or after a change in dose, call your health care professional. This medicine can cause an increase in blood pressure. Check with your doctor for instructions on monitoring your blood pressure while taking this medicine. You may get drowsy or dizzy. Do not drive, use machinery, or do anything that needs mental alertness until you know how this medicine affects you. Do not stand or sit up quickly, especially if you are an older patient. This reduces the  risk of dizzy or fainting spells. Alcohol may interfere with the effect of this medicine. Avoid alcoholic drinks. Your mouth may get dry. Chewing sugarless gum, sucking hard candy and drinking plenty of water will help. Contact your doctor if the problem does not go away or is severe. What side effects may I notice from receiving this medicine? Side effects that you should report to your doctor or health care professional as soon as possible: -allergic reactions like skin rash, itching or hives, swelling of the face, lips, or tongue -anxious -breathing problems -confusion -changes in vision -chest pain -confusion -elevated mood, decreased need for sleep, racing thoughts, impulsive behavior -eye pain -fast, irregular heartbeat -feeling faint or lightheaded, falls -feeling agitated, angry, or irritable -hallucination, loss of contact with reality -high blood pressure -loss of balance or coordination -palpitations -redness, blistering, peeling or loosening of the skin, including inside the mouth -restlessness, pacing, inability to keep still -seizures -stiff muscles -suicidal thoughts or other mood changes -trouble passing urine or change in the amount of urine -trouble sleeping -unusual bleeding or bruising -unusually weak or tired -vomiting Side effects that usually do not require medical attention (report to your doctor or health care professional if they continue or are bothersome): -change in sex drive or performance -change in appetite or weight -constipation -dizziness -dry mouth -headache -increased sweating -nausea -tired This list may not describe all possible side effects. Call your doctor for medical advice about side effects. You may report side effects to FDA at 1-800-FDA-1088. Where should I keep my medicine? Keep out of the reach of children. Store at a controlled temperature between 20 and 25 degrees C (68 and 77 degrees F), in a dry place. Throw away any  unused medicine after the expiration  date. NOTE: This sheet is a summary. It may not cover all possible information. If you have questions about this medicine, talk to your doctor, pharmacist, or health care provider.  2019 Elsevier/Gold Standard (2016-04-10 18:42:26)

## 2018-12-01 NOTE — Progress Notes (Signed)
Patient here for follow up she reports that she is having hot flashes everyday.

## 2018-12-01 NOTE — Progress Notes (Signed)
Gynecologic Oncology Interval Visit   Referring Provider: Dr. Georgianne Fick  Chief Complaint: HSIL of uncertain origin.  Subjective:  Toni Kelly is a 58 y.o. female, initially seen in consultation from Dr. Georgianne Fick for HSIL PAP s/p supracervical hysterectomy, returns to clinic today for follow-up and pap smear.   On 09/21/18, she went to [facility] for surgery with Dr. Fransisca Connors. Cervix could not be removed vaginally or laparoscopically, so she had laparotomy with cherney incision, trachelectomy, BSO, lysis of adhesions. There was a long cervical remnant still present and this was clearly completely removed, but no dysplasia found in cervix.     PATHOLOGY:  A.  Periumbilical nodule: Dermatofibroma.  B.  Right fallopian tube and ovary: Ovary: Cortical inclusion cyst. Fallopian tube: Paratubal cyst.  C.  Left fallopian tube and ovary: Ovary: No specific pathologic change.  Fallopian tube: Paratubal cysts (up to 2.9 cm).  D.  Cervix: Squamous metaplasia. Leiomyoma (0.3 cm). There is no evidence of malignancy. (Levels examined.)  Today, she reports hot flashes and loss of libido since surgery.   History Pap results were as follows: 05/23/2016-HSIL HPV positive with negative colposcopy (per Dr. Danielle Rankin note- results not available)  06/15/2017-ASCUS HPV positive.  Positive for high risk HPV.  07/31/2017-LEEP ectocervix.  Pathology revealed chronic cervicitis.  Negative for dysplasia, viral effect, and malignancy.  Transformation zone was not well-defined.  06/16/18- HSIL, HPV positive.  Negative for HPV 16/18/45  Per Dr. Georgianne Fick, minimal amount of cervical tissue remaining after prior hysterectomy and LEEP in 2018.  She has history of laparoscopic supracervical hysterectomy w/ Dr. Davis Gourd for painful menstrual periods in her 26s.    Last mammogram: 06/07/2018 BI-RADS Category 1  She denies any bleeding or discharge. Denies post-coital bleeding. She does not smoke. She has some chronic  pain in legs and back that is managed by PCP.   MRI 8/23/119- to evaluate cervix more closely in view of HSIL PAP Cervix is within normal limits. Specifically, the fibrocervical stroma is intact. No findings suspicious for cervical cancer on MRI, noting that early lesions (CIN or stage IA) may be occult by imaging. Nabothian cysts. 2.3 cm left paraovarian cyst.  No right adnexal mass.   Problem List: Patient Active Problem List   Diagnosis Date Noted  . Gastroesophageal reflux disease 01/14/2018  . HSIL on Pap smear of cervix 05/23/2016  . Localized edema 10/31/2015  . Elbow tendonitis 10/31/2015  . Mixed hyperlipidemia 09/28/2015  . Anal fistula 07/21/2013    Past Medical History: Past Medical History:  Diagnosis Date  . Bursitis   . Esophageal reflux   . Hemorrhoids 2011  . HPV (human papilloma virus) infection   . Hyperlipidemia   . Hypertension     Past Surgical History: Past Surgical History:  Procedure Laterality Date  . ABDOMINAL HYSTERECTOMY    . CERVICAL BIOPSY  W/ LOOP ELECTRODE EXCISION  05/2017   [facility]  . COLONOSCOPY  2011   @ [facility]  . hemorrhoids banding  2011  . TONSILLECTOMY      Past Gynecologic History:  G2P2 History of abnormal Pap smears: Yes Last Pap: Per HPI Contraception: Post partial hysterectomy @ ~ age 12 Sexually active: Yes History of STDs: denies  OB History:  OB History  Gravida Para Term Preterm AB Living  2 2       2   SAB TAB Ectopic Multiple Live Births               # Outcome Date GA Lbr Len/2nd  Weight Sex Delivery Anes PTL Lv  2 Para           1 Para             Obstetric Comments  1st Menstrual Cycle:  15  1st Pregnancy:  60    Family History: Family History  Problem Relation Age of Onset  . Breast cancer Maternal Aunt 68  . Breast cancer Cousin 71  . Heart failure Mother   . Lung cancer Maternal Uncle   . Lung cancer Maternal Aunt   . Lung cancer Maternal Uncle     Social History: Social History    Socioeconomic History  . Marital status: Widowed    Spouse name: Not on file  . Number of children: Not on file  . Years of education: Not on file  . Highest education level: Not on file  Occupational History  . Not on file  Social Needs  . Financial resource strain: Not on file  . Food insecurity:    Worry: Not on file    Inability: Not on file  . Transportation needs:    Medical: Not on file    Non-medical: Not on file  Tobacco Use  . Smoking status: Never Smoker  . Smokeless tobacco: Never Used  Substance and Sexual Activity  . Alcohol use: Yes    Alcohol/week: 0.0 standard drinks    Comment: rarely  . Drug use: No  . Sexual activity: Yes    Birth control/protection: Surgical  Lifestyle  . Physical activity:    Days per week: Not on file    Minutes per session: Not on file  . Stress: Not on file  Relationships  . Social connections:    Talks on phone: Not on file    Gets together: Not on file    Attends religious service: Not on file    Active member of club or organization: Not on file    Attends meetings of clubs or organizations: Not on file    Relationship status: Not on file  . Intimate partner violence:    Fear of current or ex partner: Not on file    Emotionally abused: Not on file    Physically abused: Not on file    Forced sexual activity: Not on file  Other Topics Concern  . Not on file  Social History Narrative  . Not on file    Allergies: Allergies  Allergen Reactions  . Codeine Itching  . Oxycodone Itching  . Ultram [Tramadol] Itching    Current Medications: Current Outpatient Medications  Medication Sig Dispense Refill  . hydrochlorothiazide (HYDRODIURIL) 25 MG tablet TAKE 1 TABLET (25 MG TOTAL) BY MOUTH DAILY. 90 tablet 2  . Multiple Vitamins-Minerals (WOMENS 50+ MULTI VITAMIN/MIN PO) Take 1 tablet by mouth.    Marland Kitchen omeprazole (PRILOSEC) 20 MG capsule Take 1 capsule (20 mg total) by mouth 2 (two) times daily before a meal. 60 capsule 5   . senna-docusate (SENOKOT-S) 8.6-50 MG tablet Take 2 tablets by mouth 2 (two) times daily.    . simvastatin (ZOCOR) 20 MG tablet Take 1 tablet (20 mg total) by mouth daily. 90 tablet 3   No current facility-administered medications for this visit.    Review of Systems General:  no complaints Skin: no complaints Eyes: no complaints HEENT: no complaints Breasts: no complaints Pulmonary: no complaints Cardiac: no complaints Gastrointestinal: no complaints Genitourinary/Sexual: no complaints Ob/Gyn: no complaints Musculoskeletal: no complaints Hematology: no complaints Neurologic/Psych: no complaints   Objective:  Physical Examination:  BP (!) 173/88 (BP Location: Left Arm, Patient Position: Sitting)   Pulse 79   Temp 98.4 F (36.9 C) (Tympanic)   Resp 16   Ht 5\' 4"  (1.626 m)   Wt 253 lb 14.4 oz (115.2 kg)   BMI 43.58 kg/m   ECOG Performance Status: 0 - Asymptomatic  GENERAL: Patient is a well appearing female in no acute distress NODES:  No cervical, supraclavicular, axillary, or inguinal lymphadenopathy palpated.  LUNGS:  Clear to auscultation bilaterally.  No wheezes or rhonchi. HEART:  Regular rate and rhythm. No murmur appreciated. ABDOMEN:  Soft, nontender.  Positive, normoactive bowel sounds. Laparoscopic incisions well healed.  Pfannenstiel incision is under her panus and small area of mid-incision has granulation tissue. Also has some yeast infection under panus.  I cauterized the granulation tissue with silver nitrate.    EXTREMITIES:  No peripheral edema.   SKIN:  Clear with no obvious rashes or skin changes. No nail dyscrasia. NEURO:  Nonfocal. Well oriented.  Appropriate affect.  Pelvic: Exam Chaperoned by NP EGBUS: no lesions Vagina: There is still a fibrous circumferential band above the mid vagina and the top of the vagina tapers a bit. No lesions seen, but not able to see the top of the vagina well.  PAP obtained with a spatula.    Bimanual: no masses or  tenderness.  Top of vagina intact.  Rectovaginal: deferred.    Assessment:  Toni Kelly is a 58 y.o. P2 female diagnosed with persistent HSIL PAP, HR HPV s/p negative LEEP 9/18 and multiple colposcopies. PAP 7/19 HSIL/HPV negative.  She had prior supracervical hysterectomy in remote past for menorrhagia with preservation of ovaries.    Unclear where the HSIL cells were arising and MRI showed normal cervix.  Long residual cervix removed completely 09/21/18 via Cheney incision.  Upper vaginal tissue looked normal after cervix amputated.  Pathology did not show any evidence of HSIL.  Small left paraovarian cyst associated with pain and removed with BSO and there was benign pathology.    Plan:   Problem List Items Addressed This Visit      Other   HSIL on Pap smear of cervix - Primary     Cauterized mid part of Pfannenstiel incision and nystatin power given for yeast.   The cervix was clearly removed completely and it was presumed that this was where the abnormal cells on PAP orignated, but no HSIL was found on pathology.  There is a fibrous circumferential band above the mid vagina and the top of the vagina tapers a bit. No lesions seen, but not able to see the top of the vagina well.    PAP smear done today to determine whether HSIL cells are still present.  There could have just been a small focus of  HSIL in the cervix that was not seen on path, but it is possible that the abnormal cells are coming from the vagina.  No obvious lesion has been seen in the vagina, but would try to attempt vaginal colposcopy although hard to see the apex due to the vaginal scar tissue.  Will contact her with results of PAP.   The patient's diagnosis, an outline of the further diagnostic and laboratory studies which will be required, the recommendation for surgery, and alternatives were discussed with her.  All questions were answered to their satisfaction.  Beckey Rutter, DNP, AGNP-C [place] at  [facility] [phone] (work cell) [phone] (office)  I personally interviewed and  examined the patient. Agreed with the above/below plan of care. Patient/family questions were answered.  Mellody Drown, MD  CC:  Glean [address], [address], [place] [address] [phone]

## 2018-12-04 ENCOUNTER — Other Ambulatory Visit: Payer: Self-pay | Admitting: Internal Medicine

## 2018-12-04 DIAGNOSIS — K219 Gastro-esophageal reflux disease without esophagitis: Secondary | ICD-10-CM

## 2018-12-04 LAB — PAP LB AND HPV HIGH-RISK: HPV, high-risk: NEGATIVE

## 2018-12-06 ENCOUNTER — Telehealth: Payer: Self-pay | Admitting: Nurse Practitioner

## 2018-12-06 NOTE — Telephone Encounter (Signed)
Called patient to share results of pap which were nilm and hpv negative. No answer. Message left to return call.

## 2018-12-13 ENCOUNTER — Encounter: Payer: Self-pay | Admitting: Internal Medicine

## 2018-12-13 ENCOUNTER — Ambulatory Visit (INDEPENDENT_AMBULATORY_CARE_PROVIDER_SITE_OTHER): Payer: BLUE CROSS/BLUE SHIELD | Admitting: Internal Medicine

## 2018-12-13 VITALS — BP 134/68 | HR 75 | Ht 64.0 in | Wt 252.0 lb

## 2018-12-13 DIAGNOSIS — K5900 Constipation, unspecified: Secondary | ICD-10-CM | POA: Insufficient documentation

## 2018-12-13 DIAGNOSIS — R600 Localized edema: Secondary | ICD-10-CM

## 2018-12-13 DIAGNOSIS — K219 Gastro-esophageal reflux disease without esophagitis: Secondary | ICD-10-CM | POA: Diagnosis not present

## 2018-12-13 DIAGNOSIS — K5904 Chronic idiopathic constipation: Secondary | ICD-10-CM

## 2018-12-13 DIAGNOSIS — E782 Mixed hyperlipidemia: Secondary | ICD-10-CM

## 2018-12-13 MED ORDER — SIMVASTATIN 20 MG PO TABS: 20.0000 mg | ORAL_TABLET | Freq: Every day | ORAL | 3 refills | Status: DC

## 2018-12-13 MED ORDER — HYDROCHLOROTHIAZIDE 25 MG PO TABS: 25.0000 mg | ORAL_TABLET | Freq: Every day | ORAL | 3 refills | Status: DC

## 2018-12-13 NOTE — Patient Instructions (Addendum)
Miralax powder daily in 8 oz of water or juice.

## 2018-12-13 NOTE — Progress Notes (Signed)
Date:  12/13/2018   Name:  Toni Kelly   DOB:  September 16, 1961   MRN:  [id number]   Chief Complaint: Gastroesophageal Reflux (Follow up.) and Hyperlipidemia She has recovered from total hysterectomy last October for cervical dysplasia.  She is exercising more regularly and working on losing weight.  Gastroesophageal Reflux  She reports no chest pain or no coughing. Pertinent negatives include no fatigue. She has tried a PPI for the symptoms. The treatment provided significant relief.  Hyperlipidemia  This is a chronic problem. The problem is controlled. Pertinent negatives include no chest pain or shortness of breath. Current antihyperlipidemic treatment includes statins. The current treatment provides significant improvement of lipids. There are no compliance problems.   Constipation  This is a new problem. The problem has been gradually improving since onset. The stool is described as formed and firm. The patient is on a high fiber diet. There has been adequate water intake. Pertinent negatives include no fever.  Edema - mild LE edema, controlled on HCTZ.  She continues to limit her salt.  She has had no BP issues.  Lab Results  Component Value Date   CHOL 221 (H) 01/14/2018   HDL 64 01/14/2018   LDLCALC 127 (H) 01/14/2018   TRIG 150 (H) 01/14/2018   CHOLHDL 3.5 01/14/2018   Lab Results  Component Value Date   CREATININE 1.05 (H) 07/16/2018   BUN 18 01/14/2018   NA 142 01/14/2018   K 4.4 01/14/2018   CL 100 01/14/2018   CO2 23 01/14/2018     Review of Systems  Constitutional: Negative for chills, fatigue and fever.  Respiratory: Negative for cough, chest tightness and shortness of breath.   Cardiovascular: Negative for chest pain, palpitations and leg swelling.  Gastrointestinal: Positive for constipation. Negative for anal bleeding.  Genitourinary: Negative for dysuria.       She is having hot flashes - mild  Musculoskeletal: Negative for arthralgias and gait problem.    Skin: Negative for rash.  Allergic/Immunologic: Negative for environmental allergies.  Neurological: Negative for dizziness and headaches.  Psychiatric/Behavioral: Negative for dysphoric mood and sleep disturbance.    Patient Active Problem List   Diagnosis Date Noted  . Abnormal EKG 09/14/2018  . DDD (degenerative disc disease), cervical 08/26/2018  . Gastroesophageal reflux disease 01/14/2018  . HSIL on Pap smear of cervix 05/23/2016  . Localized edema 10/31/2015  . Elbow tendonitis 10/31/2015  . Mixed hyperlipidemia 09/28/2015  . Anal fistula 07/21/2013    Allergies  Allergen Reactions  . Codeine Itching  . Oxycodone Itching  . Ultram [Tramadol] Itching    Past Surgical History:  Procedure Laterality Date  . ABDOMINAL HYSTERECTOMY    . CERVICAL BIOPSY  W/ LOOP ELECTRODE EXCISION  05/2017   [facility]  . COLONOSCOPY  2011   @ [facility]  . hemorrhoids banding  2011  . TONSILLECTOMY      Social History   Tobacco Use  . Smoking status: Never Smoker  . Smokeless tobacco: Never Used  Substance Use Topics  . Alcohol use: Yes    Alcohol/week: 0.0 standard drinks    Comment: rarely  . Drug use: No     Medication list has been reviewed and updated.  Current Meds  Medication Sig  . hydrochlorothiazide (HYDRODIURIL) 25 MG tablet TAKE 1 TABLET (25 MG TOTAL) BY MOUTH DAILY.  . Multiple Vitamins-Minerals (WOMENS 50+ MULTI VITAMIN/MIN PO) Take 1 tablet by mouth.  . nystatin (MYCOSTATIN/NYSTOP) powder Apply topically 4 (four) times  daily.  . omeprazole (PRILOSEC) 20 MG capsule TAKE 1 CAPSULE (20 MG TOTAL) BY MOUTH 2 (TWO) TIMES DAILY BEFORE A MEAL.  Marland Kitchen senna-docusate (SENOKOT-S) 8.6-50 MG tablet Take 2 tablets by mouth 2 (two) times daily.  . simvastatin (ZOCOR) 20 MG tablet Take 1 tablet (20 mg total) by mouth daily.    PHQ 2/9 Scores 12/13/2018 01/14/2018  PHQ - 2 Score 0 0  PHQ- 9 Score - 0   Wt Readings from Last 3 Encounters:  12/13/18 252 lb (114.3 kg)  12/01/18 253  lb 14.4 oz (115.2 kg)  10/20/18 250 lb 1.6 oz (113.4 kg)    Physical Exam Vitals signs and nursing note reviewed.  Constitutional:      General: She is not in acute distress.    Appearance: She is well-developed.  HENT:     Head: Normocephalic and atraumatic.  Eyes:     Pupils: Pupils are equal, round, and reactive to light.  Neck:     Musculoskeletal: Normal range of motion and neck supple.     Vascular: No carotid bruit.  Cardiovascular:     Rate and Rhythm: Normal rate and regular rhythm.     Pulses: Normal pulses.  Pulmonary:     Effort: Pulmonary effort is normal. No respiratory distress.     Breath sounds: Normal breath sounds.  Musculoskeletal: Normal range of motion.     Right lower leg: No edema.     Left lower leg: No edema.  Lymphadenopathy:     Cervical: No cervical adenopathy.  Skin:    General: Skin is warm and dry.     Findings: No rash.  Neurological:     Mental Status: She is alert and oriented to person, place, and time.  Psychiatric:        Behavior: Behavior normal.        Thought Content: Thought content normal.     BP 134/68   Pulse 75   Ht 5\' 4"  (1.626 m)   Wt 252 lb (114.3 kg)   SpO2 96%   BMI 43.26 kg/m   Assessment and Plan: 1. Gastroesophageal reflux disease, esophagitis presence not specified Controlled on PPI - CBC with Differential/Platelet  2. Mixed hyperlipidemia On statin therapy - simvastatin (ZOCOR) 20 MG tablet; Take 1 tablet (20 mg total) by mouth daily.  Dispense: 90 tablet; Refill: 3 - Comprehensive metabolic panel - Lipid panel  3. Localized edema controlled - hydrochlorothiazide (HYDRODIURIL) 25 MG tablet; Take 1 tablet (25 mg total) by mouth daily.  Dispense: 90 tablet; Refill: 3 - TSH  4. Chronic idiopathic constipation Failed senna and fiber supplements Recommend Miralax daily - TSH   Partially dictated using Editor, commissioning. Any errors are unintentional.  Halina Maidens, MD [facility]  12/13/2018

## 2018-12-14 LAB — CBC WITH DIFFERENTIAL/PLATELET
Basophils Absolute: 0.1 10*3/uL (ref 0.0–0.2)
Basos: 1 %
EOS (ABSOLUTE): 0.2 10*3/uL (ref 0.0–0.4)
Eos: 2 %
Hematocrit: 42.2 % (ref 34.0–46.6)
Hemoglobin: 13.7 g/dL (ref 11.1–15.9)
Immature Grans (Abs): 0 10*3/uL (ref 0.0–0.1)
Immature Granulocytes: 0 %
Lymphocytes Absolute: 2.1 10*3/uL (ref 0.7–3.1)
Lymphs: 30 %
MCH: 28.8 pg (ref 26.6–33.0)
MCHC: 32.5 g/dL (ref 31.5–35.7)
MCV: 89 fL (ref 79–97)
Monocytes Absolute: 0.5 10*3/uL (ref 0.1–0.9)
Monocytes: 8 %
Neutrophils Absolute: 4.3 10*3/uL (ref 1.4–7.0)
Neutrophils: 59 %
Platelets: 283 10*3/uL (ref 150–450)
RBC: 4.75 x10E6/uL (ref 3.77–5.28)
RDW: 13.5 % (ref 11.7–15.4)
WBC: 7.1 10*3/uL (ref 3.4–10.8)

## 2018-12-14 LAB — COMPREHENSIVE METABOLIC PANEL
ALT: 24 IU/L (ref 0–32)
AST: 26 IU/L (ref 0–40)
Albumin/Globulin Ratio: 1.3 (ref 1.2–2.2)
Albumin: 4.1 g/dL (ref 3.8–4.9)
Alkaline Phosphatase: 63 IU/L (ref 39–117)
BUN/Creatinine Ratio: 12 (ref 9–23)
BUN: 11 mg/dL (ref 6–24)
Bilirubin Total: 0.3 mg/dL (ref 0.0–1.2)
CO2: 26 mmol/L (ref 20–29)
Calcium: 9.8 mg/dL (ref 8.7–10.2)
Chloride: 101 mmol/L (ref 96–106)
Creatinine, Ser: 0.92 mg/dL (ref 0.57–1.00)
GFR calc Af Amer: 79 mL/min/{1.73_m2} (ref >59)
GFR calc non Af Amer: 69 mL/min/{1.73_m2} (ref >59)
Globulin, Total: 3.1 g/dL (ref 1.5–4.5)
Glucose: 103 mg/dL — ABNORMAL HIGH (ref 65–99)
Potassium: 4.6 mmol/L (ref 3.5–5.2)
Sodium: 144 mmol/L (ref 134–144)
Total Protein: 7.2 g/dL (ref 6.0–8.5)

## 2018-12-14 LAB — LIPID PANEL
Chol/HDL Ratio: 2.8 ratio (ref 0.0–4.4)
Cholesterol, Total: 174 mg/dL (ref 100–199)
HDL: 63 mg/dL (ref >39)
LDL Calculated: 84 mg/dL (ref 0–99)
Triglycerides: 134 mg/dL (ref 0–149)
VLDL Cholesterol Cal: 27 mg/dL (ref 5–40)

## 2018-12-14 LAB — TSH: TSH: 0.936 u[IU]/mL (ref 0.450–4.500)

## 2018-12-15 ENCOUNTER — Ambulatory Visit: Payer: BLUE CROSS/BLUE SHIELD

## 2018-12-31 ENCOUNTER — Encounter: Payer: Self-pay | Admitting: Internal Medicine

## 2018-12-31 ENCOUNTER — Ambulatory Visit (INDEPENDENT_AMBULATORY_CARE_PROVIDER_SITE_OTHER): Payer: BLUE CROSS/BLUE SHIELD | Admitting: Internal Medicine

## 2018-12-31 ENCOUNTER — Other Ambulatory Visit: Payer: Self-pay

## 2018-12-31 VITALS — BP 128/80 | HR 88 | Temp 98.1°F | Ht 64.0 in | Wt 251.0 lb

## 2018-12-31 DIAGNOSIS — J019 Acute sinusitis, unspecified: Secondary | ICD-10-CM | POA: Diagnosis not present

## 2018-12-31 MED ORDER — BENZONATATE 100 MG PO CAPS: 100.0000 mg | ORAL_CAPSULE | Freq: Three times a day (TID) | ORAL | 0 refills | Status: DC

## 2018-12-31 MED ORDER — AMOXICILLIN-POT CLAVULANATE 875-125 MG PO TABS: 1.0000 | ORAL_TABLET | Freq: Two times a day (BID) | ORAL | 0 refills | Status: DC

## 2018-12-31 NOTE — Progress Notes (Signed)
Date:  12/31/2018   Name:  Toni Kelly   DOB:  1961/03/12   MRN:  [id number]   Chief Complaint: Sore Throat (SORE THROAT AND HEADACHE SINCE THIS MORNING. HUSBAND HAS STREP THROAT. THINKS SHE MAY HAVE THIS AS WELL. COUGH WITH GREEN MUCOUS. )  Sore Throat   This is a new problem. The current episode started today. The problem has been unchanged. There has been no fever. The pain is moderate. Associated symptoms include congestion, coughing and headaches. Pertinent negatives include no diarrhea, shortness of breath, trouble swallowing or vomiting. She has had exposure to strep. She has tried nothing for the symptoms.    Review of Systems  Constitutional: Positive for fatigue. Negative for chills and fever.  HENT: Positive for congestion, postnasal drip, sinus pressure and sore throat. Negative for trouble swallowing.   Respiratory: Positive for cough. Negative for chest tightness and shortness of breath.   Cardiovascular: Negative for chest pain and palpitations.  Gastrointestinal: Negative for diarrhea and vomiting.  Neurological: Positive for headaches. Negative for dizziness.    Patient Active Problem List   Diagnosis Date Noted  . Constipation 12/13/2018  . Abnormal EKG 09/14/2018  . DDD (degenerative disc disease), cervical 08/26/2018  . Gastroesophageal reflux disease 01/14/2018  . HSIL on Pap smear of cervix 05/23/2016  . Localized edema 10/31/2015  . Elbow tendonitis 10/31/2015  . Mixed hyperlipidemia 09/28/2015  . Anal fistula 07/21/2013    Allergies  Allergen Reactions  . Codeine Itching  . Oxycodone Itching  . Ultram [Tramadol] Itching    Past Surgical History:  Procedure Laterality Date  . ABDOMINAL HYSTERECTOMY  08/2018   abnormal pap  . CERVICAL BIOPSY  W/ LOOP ELECTRODE EXCISION  05/2017   [facility]  . COLONOSCOPY  2011   @ [facility]  . hemorrhoids banding  2011  . TONSILLECTOMY      Social History   Tobacco Use  . Smoking status: Never Smoker  .  Smokeless tobacco: Never Used  Substance Use Topics  . Alcohol use: Yes    Alcohol/week: 0.0 standard drinks    Comment: rarely  . Drug use: No     Medication list has been reviewed and updated.  Current Meds  Medication Sig  . hydrochlorothiazide (HYDRODIURIL) 25 MG tablet Take 1 tablet (25 mg total) by mouth daily.  . Multiple Vitamins-Minerals (WOMENS 50+ MULTI VITAMIN/MIN PO) Take 1 tablet by mouth.  . nystatin (MYCOSTATIN/NYSTOP) powder Apply topically 4 (four) times daily.  Marland Kitchen omeprazole (PRILOSEC) 20 MG capsule TAKE 1 CAPSULE (20 MG TOTAL) BY MOUTH 2 (TWO) TIMES DAILY BEFORE A MEAL.  Marland Kitchen senna-docusate (SENOKOT-S) 8.6-50 MG tablet Take 2 tablets by mouth 2 (two) times daily.  . simvastatin (ZOCOR) 20 MG tablet Take 1 tablet (20 mg total) by mouth daily.    PHQ 2/9 Scores 12/31/2018 12/13/2018 01/14/2018  PHQ - 2 Score 0 0 0  PHQ- 9 Score - - 0    Physical Exam Constitutional:      Appearance: She is well-developed.  HENT:     Right Ear: Ear canal and external ear normal. Tympanic membrane is not erythematous or retracted.     Left Ear: Ear canal and external ear normal. Tympanic membrane is not erythematous or retracted.     Nose:     Right Sinus: No maxillary sinus tenderness or frontal sinus tenderness.     Left Sinus: No maxillary sinus tenderness or frontal sinus tenderness.     Mouth/Throat:  Mouth: No oral lesions.     Pharynx: Uvula midline. Posterior oropharyngeal erythema present. No pharyngeal swelling or oropharyngeal exudate.  Cardiovascular:     Rate and Rhythm: Normal rate and regular rhythm.     Heart sounds: Normal heart sounds.  Pulmonary:     Breath sounds: Normal breath sounds. No wheezing or rales.  Lymphadenopathy:     Cervical: No cervical adenopathy.  Neurological:     Mental Status: She is alert and oriented to person, place, and time.     BP 128/80   Pulse 88   Temp 98.1 F (36.7 C) (Oral)   Ht 5\' 4"  (1.626 m)   Wt 251 lb (113.9 kg)    SpO2 96%   BMI 43.08 kg/m   Assessment and Plan: 1. Acute non-recurrent sinusitis, unspecified location Continue fluids, rest, warm liquids - amoxicillin-clavulanate (AUGMENTIN) 875-125 MG tablet; Take 1 tablet by mouth 2 (two) times daily for 10 days.  Dispense: 20 tablet; Refill: 0 - benzonatate (TESSALON) 100 MG capsule; Take 1 capsule (100 mg total) by mouth 3 (three) times daily.  Dispense: 20 capsule; Refill: 0   Partially dictated using Editor, commissioning. Any errors are unintentional.  Halina Maidens, MD [facility]  12/31/2018

## 2019-01-17 ENCOUNTER — Encounter: Payer: Self-pay | Admitting: Internal Medicine

## 2019-01-18 ENCOUNTER — Other Ambulatory Visit: Payer: Self-pay | Admitting: Internal Medicine

## 2019-01-18 DIAGNOSIS — R059 Cough, unspecified: Secondary | ICD-10-CM

## 2019-01-18 DIAGNOSIS — R05 Cough: Secondary | ICD-10-CM

## 2019-01-18 MED ORDER — AMOXICILLIN-POT CLAVULANATE 875-125 MG PO TABS: 1.0000 | ORAL_TABLET | Freq: Two times a day (BID) | ORAL | 0 refills | Status: DC

## 2019-01-18 NOTE — Telephone Encounter (Signed)
Pt requesting another round of abx. Please advise.

## 2019-03-23 ENCOUNTER — Encounter: Payer: Self-pay | Admitting: Internal Medicine

## 2019-04-12 ENCOUNTER — Other Ambulatory Visit: Payer: Self-pay | Admitting: Family Medicine

## 2019-04-12 ENCOUNTER — Other Ambulatory Visit: Payer: Self-pay | Admitting: Obstetrics and Gynecology

## 2019-04-12 DIAGNOSIS — Z1231 Encounter for screening mammogram for malignant neoplasm of breast: Secondary | ICD-10-CM

## 2019-04-15 ENCOUNTER — Other Ambulatory Visit: Payer: Self-pay | Admitting: Obstetrics and Gynecology

## 2019-04-15 DIAGNOSIS — N951 Menopausal and female climacteric states: Secondary | ICD-10-CM

## 2019-04-15 NOTE — Telephone Encounter (Signed)
Needs visit for lab draw, orders placed

## 2019-04-19 NOTE — Telephone Encounter (Signed)
Patient is schedule 04/20/19

## 2019-04-20 ENCOUNTER — Other Ambulatory Visit: Payer: Self-pay

## 2019-04-20 ENCOUNTER — Other Ambulatory Visit: Payer: BLUE CROSS/BLUE SHIELD

## 2019-04-20 DIAGNOSIS — N951 Menopausal and female climacteric states: Secondary | ICD-10-CM | POA: Diagnosis not present

## 2019-04-21 ENCOUNTER — Other Ambulatory Visit: Payer: Self-pay | Admitting: Obstetrics and Gynecology

## 2019-04-21 LAB — THYROID PANEL WITH TSH
Free Thyroxine Index: 2.1 (ref 1.2–4.9)
T3 Uptake Ratio: 25 % (ref 24–39)
T4, Total: 8.3 ug/dL (ref 4.5–12.0)
TSH: 1.12 u[IU]/mL (ref 0.450–4.500)

## 2019-04-21 LAB — ESTRADIOL: Estradiol: 8.3 pg/mL

## 2019-04-21 LAB — FOLLICLE STIMULATING HORMONE: FSH: 53.8 m[IU]/mL

## 2019-04-21 MED ORDER — GABAPENTIN 300 MG PO CAPS: ORAL_CAPSULE | ORAL | 0 refills | Status: DC

## 2019-05-10 ENCOUNTER — Encounter: Payer: Self-pay | Admitting: Internal Medicine

## 2019-05-10 ENCOUNTER — Other Ambulatory Visit: Payer: Self-pay | Admitting: Internal Medicine

## 2019-05-10 DIAGNOSIS — K5904 Chronic idiopathic constipation: Secondary | ICD-10-CM

## 2019-05-10 MED ORDER — LINACLOTIDE 145 MCG PO CAPS: 145.0000 ug | ORAL_CAPSULE | Freq: Every day | ORAL | 5 refills | Status: DC

## 2019-05-11 ENCOUNTER — Telehealth: Payer: Self-pay

## 2019-05-11 NOTE — Telephone Encounter (Signed)
Can she take amoxicillin for this? If not, I will let her know we can do a telephone visit.

## 2019-05-11 NOTE — Telephone Encounter (Signed)
Completed PA on [email or web address] for patients Linzess 145 mcg.  "Your information has been submitted to [place]. [employer] [place] will review the request and fax you a determination directly, typically within 3 business days of your submission once all necessary information is received.  If [employer] has not responded in 3 business days or if you have any questions about your submission, contact [facility] at [phone]."  (KEY: [id number])  They recommended trying Trulance T2-3 first if medication comes back declined.  Awaiting faxed outcome.

## 2019-05-14 ENCOUNTER — Other Ambulatory Visit: Payer: Self-pay | Admitting: Obstetrics and Gynecology

## 2019-05-18 ENCOUNTER — Other Ambulatory Visit: Payer: Self-pay | Admitting: Internal Medicine

## 2019-05-18 ENCOUNTER — Encounter: Payer: Self-pay | Admitting: Internal Medicine

## 2019-05-18 DIAGNOSIS — K5904 Chronic idiopathic constipation: Secondary | ICD-10-CM

## 2019-05-18 MED ORDER — TRULANCE 3 MG PO TABS: 3.0000 mg | ORAL_TABLET | Freq: Every day | ORAL | 5 refills | Status: DC

## 2019-05-18 NOTE — Telephone Encounter (Signed)
PA Denied per Patient. Checked online and it has been. Trulance is the alternative.   ( we cannot receive Fax so never received outcome. )

## 2019-05-23 IMAGING — CR DG CHEST 2V
2 series · 2 of 2 positions shown · non-contrast
Comparison: 02/10/2017.

CLINICAL DATA: Cough and chest congestion.

EXAM:
CHEST - 2 VIEW

[chest pa]
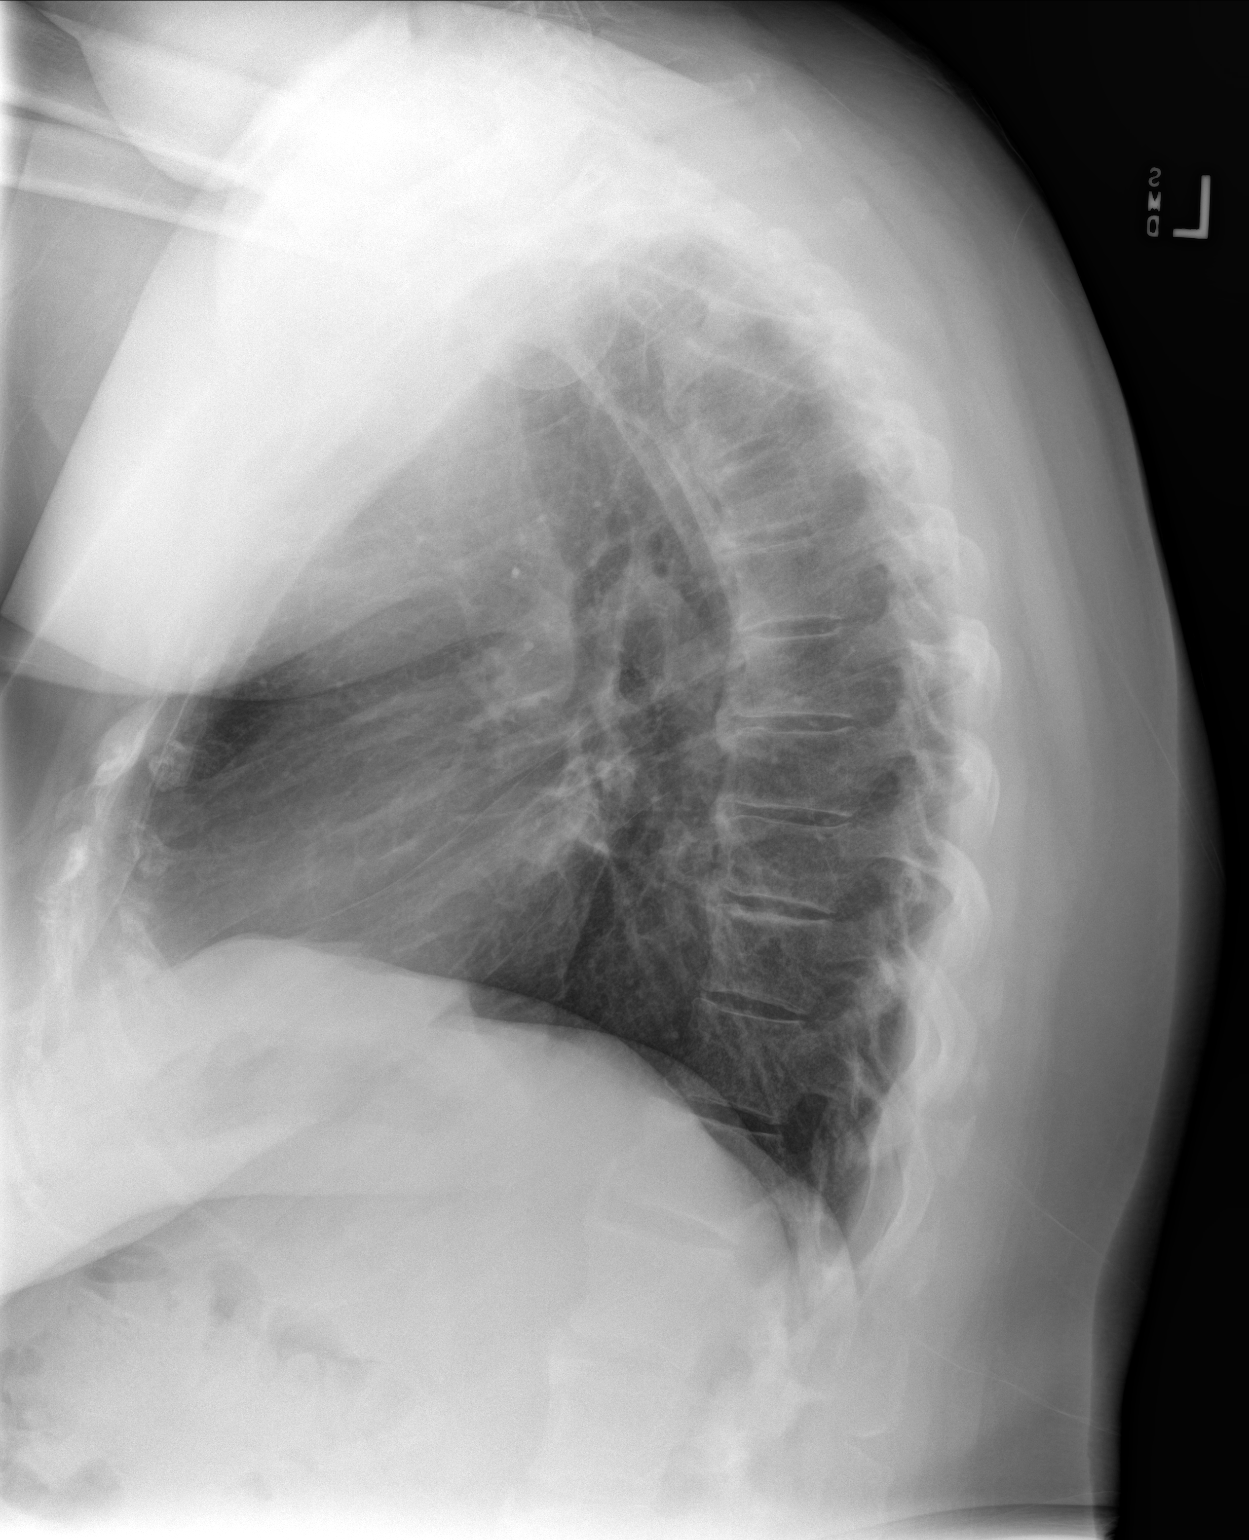

[chest lat]
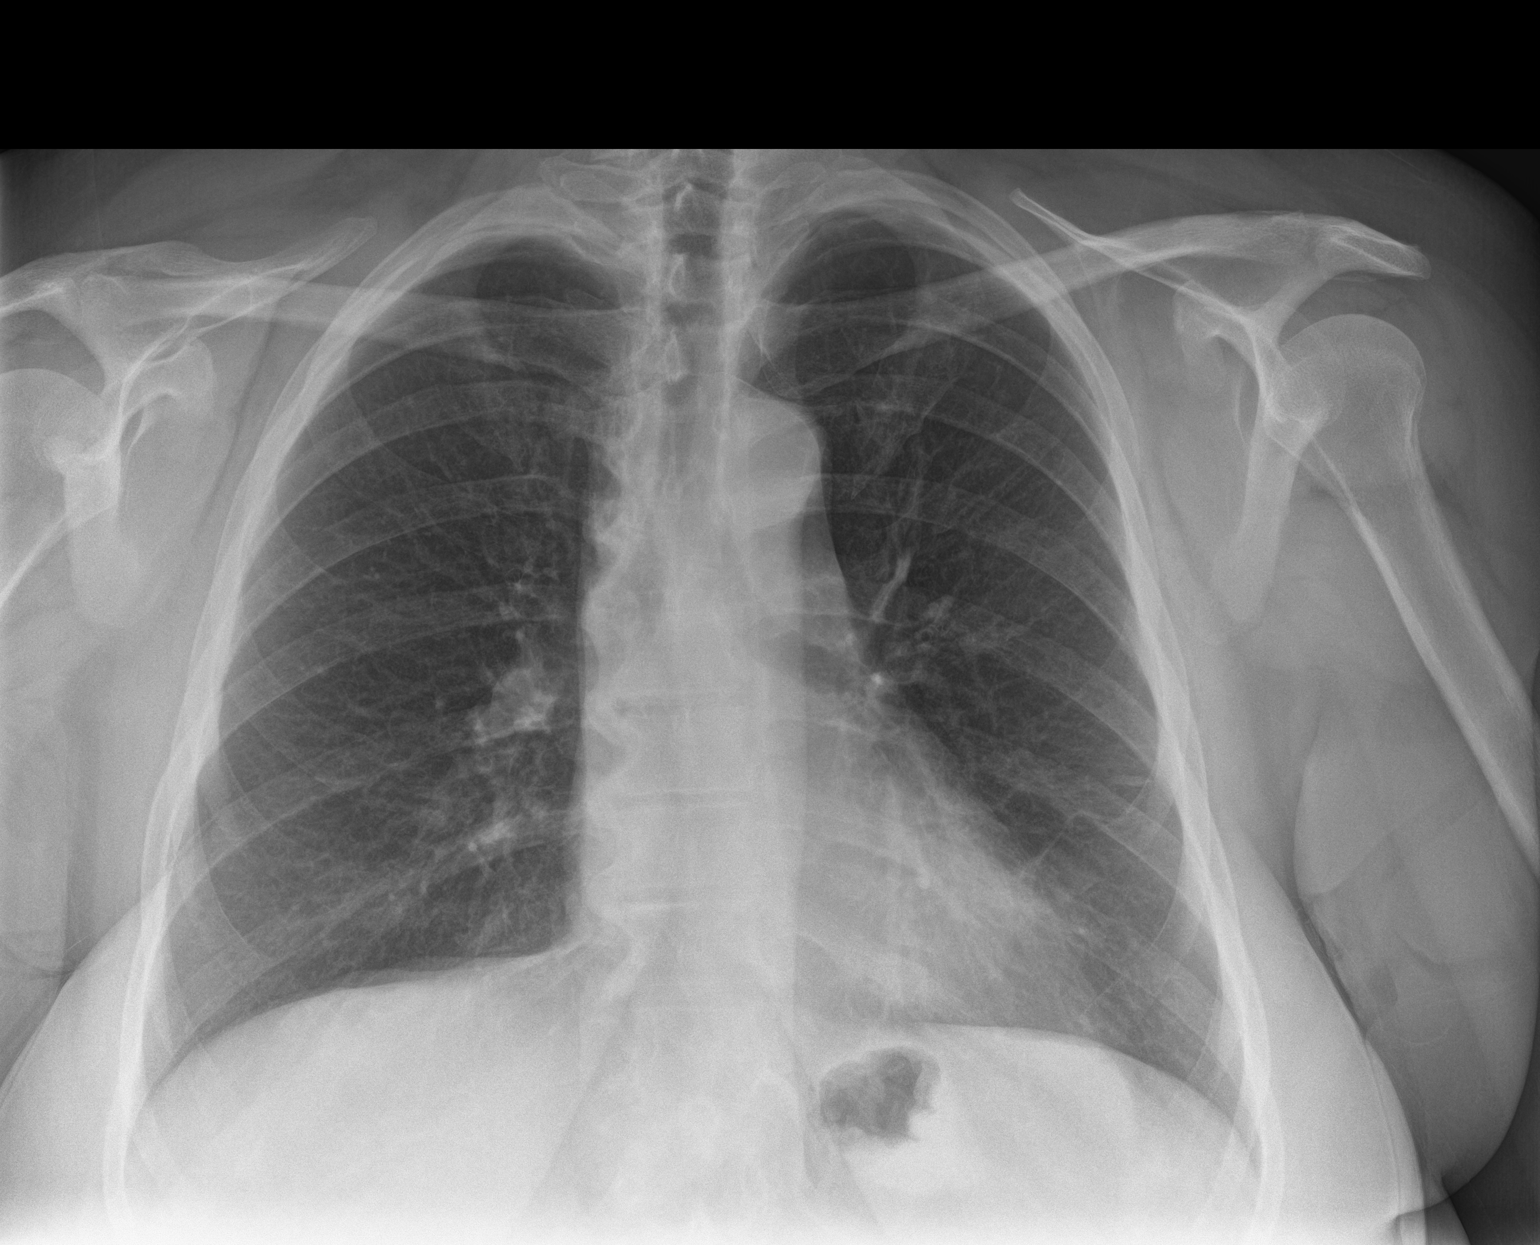

[2 of 2 positions shown; findings below may reference images not displayed]

FINDINGS: Normal sized heart. Stable small amount of linear scarring in the
left mid to lower lung zone. Otherwise, clear lungs. Thoracic spine
degenerative changes.
IMPRESSION: No acute abnormality.

## 2019-05-24 ENCOUNTER — Telehealth: Payer: Self-pay

## 2019-05-24 ENCOUNTER — Other Ambulatory Visit: Payer: Self-pay | Admitting: Internal Medicine

## 2019-05-24 DIAGNOSIS — Z1211 Encounter for screening for malignant neoplasm of colon: Secondary | ICD-10-CM

## 2019-05-24 NOTE — Telephone Encounter (Signed)
Patient called saying she received a call from [facility] stating she is due for colonoscopy. It has been 10 years since patient has had this 05/04/2009.   Please advise.

## 2019-05-24 NOTE — Telephone Encounter (Signed)
Then she should call [facility] and schedule the colonoscopy.

## 2019-05-24 NOTE — Telephone Encounter (Signed)
She said she does not want to be seen there. Its too far from home. She wants to transfer services here in [place] to have next and future colonoscopies.

## 2019-05-24 NOTE — Telephone Encounter (Signed)
Okay but she needs to know that her insurance dictates where it is performed, not the doctor.  She may still have to drive to the endoscopy center in [place].  So I will refer to [facility] but she needs to be aware of the situation.

## 2019-05-25 ENCOUNTER — Telehealth: Payer: Self-pay

## 2019-05-25 ENCOUNTER — Other Ambulatory Visit: Payer: Self-pay

## 2019-05-25 DIAGNOSIS — Z1211 Encounter for screening for malignant neoplasm of colon: Secondary | ICD-10-CM

## 2019-05-25 NOTE — Telephone Encounter (Signed)
Gastroenterology Pre-Procedure Review  Request Date: Pending Pt Call Back Requesting Physician: Dr. Allen Norris  PATIENT REVIEW QUESTIONS: The patient responded to the following health history questions as indicated:    1. Are you having any GI issues? no 2. Do you have a personal history of Polyps? yes (pt states she think she may have had polpys on the last colonoscopy 9-10 years ago) 3. Do you have a family history of Colon Cancer or Polyps? no 4. Diabetes Mellitus? no 5. Joint replacements in the past 12 months?no 6. Major health problems in the past 3 months?Hysterectomy October 2019 7. Any artificial heart valves, MVP, or defibrillator?no    MEDICATIONS & ALLERGIES:    Patient reports the following regarding taking any anticoagulation/antiplatelet therapy:   Plavix, Coumadin, Eliquis, Xarelto, Lovenox, Pradaxa, Brilinta, or Effient? no Aspirin? no  Patient confirms/reports the following medications:  Current Outpatient Medications  Medication Sig Dispense Refill  . amoxicillin-clavulanate (AUGMENTIN) 875-125 MG tablet Take 1 tablet by mouth 2 (two) times daily. 20 tablet 0  . benzonatate (TESSALON) 100 MG capsule Take 1 capsule (100 mg total) by mouth 3 (three) times daily. 20 capsule 0  . gabapentin (NEURONTIN) 300 MG capsule TAKE 1 CAPSULE BY MOUTH AT BEDTIME FOR 7 DAYS, THEN TAKE 1 CAPSULE TWICE A DAY FOR MAINTENANCE 180 capsule 1  . hydrochlorothiazide (HYDRODIURIL) 25 MG tablet Take 1 tablet (25 mg total) by mouth daily. 90 tablet 3  . Multiple Vitamins-Minerals (WOMENS 50+ MULTI VITAMIN/MIN PO) Take 1 tablet by mouth.    . nystatin (MYCOSTATIN/NYSTOP) powder Apply topically 4 (four) times daily. 15 g 0  . omeprazole (PRILOSEC) 20 MG capsule TAKE 1 CAPSULE (20 MG TOTAL) BY MOUTH 2 (TWO) TIMES DAILY BEFORE A MEAL. 180 capsule 1  . Plecanatide (TRULANCE) 3 MG TABS Take 3 mg by mouth daily at 2 PM. 30 tablet 5  . senna-docusate (SENOKOT-S) 8.6-50 MG tablet Take 2 tablets by mouth 2  (two) times daily.    . simvastatin (ZOCOR) 20 MG tablet Take 1 tablet (20 mg total) by mouth daily. 90 tablet 3   No current facility-administered medications for this visit.     Patient confirms/reports the following allergies:  Allergies  Allergen Reactions  . Codeine Itching  . Oxycodone Itching  . Ultram [Tramadol] Itching    No orders of the defined types were placed in this encounter.   AUTHORIZATION INFORMATION Primary Insurance: 1D#: Group #:  Secondary Insurance: 1D#: Group #:  SCHEDULE INFORMATION: Date: Pending Scheduling Time: Location:[place] Requested

## 2019-05-25 NOTE — Telephone Encounter (Signed)
Called and spoke with pt. Informed of this. Told her someone should call her to schedule appt but she may need to be seen in [place] still depending on insurance.

## 2019-05-26 ENCOUNTER — Other Ambulatory Visit: Payer: Self-pay

## 2019-05-26 ENCOUNTER — Encounter: Payer: Self-pay | Admitting: *Deleted

## 2019-05-26 MED ORDER — NA SULFATE-K SULFATE-MG SULF 17.5-3.13-1.6 GM/177ML PO SOLN: 1.0000 | Freq: Once | ORAL | 0 refills | Status: AC

## 2019-05-27 ENCOUNTER — Encounter: Payer: Self-pay | Admitting: Anesthesiology

## 2019-05-31 ENCOUNTER — Other Ambulatory Visit: Payer: Self-pay

## 2019-06-01 ENCOUNTER — Other Ambulatory Visit: Payer: Self-pay

## 2019-06-01 ENCOUNTER — Telehealth: Payer: Self-pay

## 2019-06-01 ENCOUNTER — Inpatient Hospital Stay: Payer: BC Managed Care – PPO | Attending: Obstetrics and Gynecology | Admitting: Obstetrics and Gynecology

## 2019-06-01 ENCOUNTER — Other Ambulatory Visit: Payer: Self-pay | Admitting: Obstetrics and Gynecology

## 2019-06-01 VITALS — BP 139/85 | HR 76 | Temp 97.6°F | Ht 64.0 in | Wt 257.6 lb

## 2019-06-01 DIAGNOSIS — A630 Anogenital (venereal) warts: Secondary | ICD-10-CM | POA: Insufficient documentation

## 2019-06-01 DIAGNOSIS — E782 Mixed hyperlipidemia: Secondary | ICD-10-CM | POA: Diagnosis not present

## 2019-06-01 DIAGNOSIS — Z90710 Acquired absence of both cervix and uterus: Secondary | ICD-10-CM | POA: Insufficient documentation

## 2019-06-01 DIAGNOSIS — R87613 High grade squamous intraepithelial lesion on cytologic smear of cervix (HGSIL): Secondary | ICD-10-CM | POA: Diagnosis not present

## 2019-06-01 DIAGNOSIS — K5900 Constipation, unspecified: Secondary | ICD-10-CM | POA: Insufficient documentation

## 2019-06-01 DIAGNOSIS — M5030 Other cervical disc degeneration, unspecified cervical region: Secondary | ICD-10-CM | POA: Diagnosis not present

## 2019-06-01 DIAGNOSIS — Z79899 Other long term (current) drug therapy: Secondary | ICD-10-CM | POA: Insufficient documentation

## 2019-06-01 DIAGNOSIS — K219 Gastro-esophageal reflux disease without esophagitis: Secondary | ICD-10-CM | POA: Insufficient documentation

## 2019-06-01 DIAGNOSIS — I10 Essential (primary) hypertension: Secondary | ICD-10-CM | POA: Diagnosis not present

## 2019-06-01 DIAGNOSIS — M1990 Unspecified osteoarthritis, unspecified site: Secondary | ICD-10-CM | POA: Diagnosis not present

## 2019-06-01 NOTE — Telephone Encounter (Signed)
LVM for patient informing her that I've received her request to cancel colonoscopy with Dr. Allen Norris for 06/06/19 at [facility].  Asked her to call me back to let me know why she is canceling and to see if she would like to reschedule now or later.  Thanks [employer]

## 2019-06-01 NOTE — Progress Notes (Signed)
Gynecologic Oncology Interval Visit   Referring Provider: Dr. Georgianne Fick  Chief Complaint: HSIL of uncertain origin.  Subjective:  Toni Kelly is a 58 y.o. female, initially seen in consultation from Dr. Georgianne Fick for HSIL PAP s/p supracervical hysterectomy, returns to clinic today for follow-up and pap smear.   PAP 12/01/18 normal and no endocervical cells present.  HPV positive.  Returns today for follow up exam and PAP smear.  No vaginal discharge or bleeding.    Today, she reports hot flashes.  Tried Effexor per Dr Georgianne Fick and did not help so he has called in Rx for neurontin.    History Pap results were as follows: 05/23/2016-HSIL HPV positive with negative colposcopy (per Dr. Danielle Rankin note- results not available)  06/15/2017-ASCUS HPV positive.  Positive for high risk HPV.  07/31/2017-LEEP ectocervix.  Pathology revealed chronic cervicitis.  Negative for dysplasia, viral effect, and malignancy.  Transformation zone was not well-defined.  06/16/18- HSIL, HPV positive.  Negative for HPV 16/18/45  Per Dr. Georgianne Fick, minimal amount of cervical tissue remaining after prior hysterectomy and LEEP in 2018.  She has history of laparoscopic supracervical hysterectomy w/ Dr. Davis Gourd for painful menstrual periods in her 76s.    Last mammogram: 06/07/2018 BI-RADS Category 1  She denies any bleeding or discharge. Denies post-coital bleeding. She does not smoke. She has some chronic pain in legs and back that is managed by PCP.   MRI 07/16/18- to evaluate cervix more closely in view of HSIL PAP Cervix is within normal limits. Specifically, the fibrocervical stroma is intact. No findings suspicious for cervical cancer on MRI, noting that early lesions (CIN or stage IA) may be occult by imaging. Nabothian cysts. 2.3 cm left paraovarian cyst.  No right adnexal mass.  On 09/21/18, she went to [facility] for [facility] with Dr. Fransisca Connors. Cervix could not be removed vaginally or laparoscopically, so she had  laparotomy with cherney incision, trachelectomy, BSO, lysis of adhesions. There was a long cervical remnant still present and this was clearly completely removed, but no dysplasia found in cervix on pathology.      PATHOLOGY:  A.  Periumbilical nodule: Dermatofibroma.  B.  Right fallopian tube and ovary: Ovary: Cortical inclusion cyst. Fallopian tube: Paratubal cyst.  C.  Left fallopian tube and ovary: Ovary: No specific pathologic change.  Fallopian tube: Paratubal cysts (up to 2.9 cm).  D.  Cervix: Squamous metaplasia. Leiomyoma (0.3 cm). There is no evidence of malignancy. (Levels examined.)  PAP 12/01/18 normal and no endocervical cells present.  HPV positive.      Problem List: Patient Active Problem List   Diagnosis Date Noted  . Constipation 12/13/2018  . Abnormal EKG 09/14/2018  . DDD (degenerative disc disease), cervical 08/26/2018  . Gastroesophageal reflux disease 01/14/2018  . HSIL on Pap smear of cervix 05/23/2016  . Localized edema 10/31/2015  . Elbow tendonitis 10/31/2015  . Mixed hyperlipidemia 09/28/2015  . Anal fistula 07/21/2013    Past Medical History: Past Medical History:  Diagnosis Date  . Arthritis    lower back  . Bursitis   . Esophageal reflux   . Hemorrhoids 2011  . HPV (human papilloma virus) infection   . Hyperlipidemia   . Hypertension     Past Surgical History: Past Surgical History:  Procedure Laterality Date  . ABDOMINAL HYSTERECTOMY  08/2018   abnormal pap  . CERVICAL BIOPSY  W/ LOOP ELECTRODE EXCISION  05/2017   [facility]  . COLONOSCOPY  2011   @ [facility]  . hemorrhoids banding  2011  . TONSILLECTOMY      Past Gynecologic History:  G2P2 History of abnormal Pap smears: Yes Last Pap: Per HPI Contraception: Post partial hysterectomy @ ~ age 16 Sexually active: Yes History of STDs: denies  OB History:  OB History  Gravida Para Term Preterm AB Living  2 2       2   SAB TAB Ectopic Multiple Live Births               #  Outcome Date GA Lbr Len/2nd Weight Sex Delivery Anes PTL Lv  2 Para           1 Para             Obstetric Comments  1st Menstrual Cycle:  15  1st Pregnancy:  69    Family History: Family History  Problem Relation Age of Onset  . Breast cancer Maternal Aunt 71  . Breast cancer Cousin 23  . Heart failure Mother   . Lung cancer Maternal Uncle   . Lung cancer Maternal Aunt   . Lung cancer Maternal Uncle     Social History: Social History   Socioeconomic History  . Marital status: Widowed    Spouse name: Not on file  . Number of children: Not on file  . Years of education: Not on file  . Highest education level: Not on file  Occupational History  . Not on file  Social Needs  . Financial resource strain: Not on file  . Food insecurity    Worry: Not on file    Inability: Not on file  . Transportation needs    Medical: Not on file    Non-medical: Not on file  Tobacco Use  . Smoking status: Never Smoker  . Smokeless tobacco: Never Used  Substance and Sexual Activity  . Alcohol use: Yes    Alcohol/week: 0.0 standard drinks    Comment: rarely  . Drug use: No  . Sexual activity: Yes    Birth control/protection: Surgical  Lifestyle  . Physical activity    Days per week: Not on file    Minutes per session: Not on file  . Stress: Not on file  Relationships  . Social Herbalist on phone: Not on file    Gets together: Not on file    Attends religious service: Not on file    Active member of club or organization: Not on file    Attends meetings of clubs or organizations: Not on file    Relationship status: Not on file  . Intimate partner violence    Fear of current or ex partner: Not on file    Emotionally abused: Not on file    Physically abused: Not on file    Forced sexual activity: Not on file  Other Topics Concern  . Not on file  Social History Narrative  . Not on file    Allergies: Allergies  Allergen Reactions  . Codeine Itching  .  Oxycodone Itching  . Ultram [Tramadol] Itching    Current Medications: Current Outpatient Medications  Medication Sig Dispense Refill  . hydrochlorothiazide (HYDRODIURIL) 25 MG tablet Take 1 tablet (25 mg total) by mouth daily. 90 tablet 3  . Multiple Vitamins-Minerals (WOMENS 50+ MULTI VITAMIN/MIN PO) Take 1 tablet by mouth.    Marland Kitchen omeprazole (PRILOSEC) 20 MG capsule TAKE 1 CAPSULE (20 MG TOTAL) BY MOUTH 2 (TWO) TIMES DAILY BEFORE A MEAL. 180 capsule 1  . Plecanatide (TRULANCE) 3 MG  TABS Take 3 mg by mouth daily at 2 PM. 30 tablet 5  . simvastatin (ZOCOR) 20 MG tablet Take 1 tablet (20 mg total) by mouth daily. 90 tablet 3  . gabapentin (NEURONTIN) 300 MG capsule TAKE 1 CAPSULE BY MOUTH AT BEDTIME FOR 7 DAYS, THEN TAKE 1 CAPSULE TWICE A DAY FOR MAINTENANCE (Patient not taking: Reported on 05/26/2019) 180 capsule 1  . nystatin (MYCOSTATIN/NYSTOP) powder Apply topically 4 (four) times daily. (Patient not taking: Reported on 05/26/2019) 15 g 0   No current facility-administered medications for this visit.    Review of Systems General:  no complaints Skin: no complaints Eyes: no complaints HEENT: no complaints Breasts: no complaints Pulmonary: no complaints Cardiac: no complaints Gastrointestinal: no complaints Genitourinary/Sexual: no complaints Ob/Gyn: no complaints Musculoskeletal: no complaints Hematology: no complaints Neurologic/Psych: no complaints   Objective:  Physical Examination:  BP 139/85 (BP Location: Right Arm, Patient Position: Sitting)   Pulse 76   Temp 97.6 F (36.4 C) (Tympanic)   Ht 5\' 4"  (1.626 m)   Wt 257 lb 9.6 oz (116.8 kg)   BMI 44.22 kg/m   ECOG Performance Status: 0 - Asymptomatic  GENERAL: Patient is a well appearing female in no acute distress NODES:  No cervical, supraclavicular, axillary, or inguinal lymphadenopathy palpated.  LUNGS:  Clear to auscultation bilaterally.  No wheezes or rhonchi. HEART:  Regular rate and rhythm. No murmur  appreciated. ABDOMEN:  Soft, nontender.  Positive, normoactive bowel sounds. Laparoscopic incisions well healed.  Pfannenstiel incision is under her panus and small area of mid-incision has granulation tissue. Also has some yeast infection under panus.  I cauterized the granulation tissue with silver nitrate.    EXTREMITIES:  No peripheral edema.   SKIN:  Clear with no obvious rashes or skin changes. No nail dyscrasia. NEURO:  Nonfocal. Well oriented.  Appropriate affect.  Pelvic: Exam Chaperoned by NP EGBUS: no lesions Vagina: There is still a fibrous circumferential band above the mid vagina and the top of the vagina tapers above that. No lesions seen, but not able to see the top of the vagina well.        Bimanual: no masses or tenderness.  Top of vagina intact. Can feel this area well and it is smooth. Rectovaginal: confirmed.    Assessment:  Toni Kelly is a 58 y.o. P2 female diagnosed with persistent HSIL PAP, HR HPV s/p negative LEEP 9/18 and multiple colposcopies. PAP 7/19 HSIL/HPV negative.  She had prior supracervical hysterectomy in remote past for menorrhagia with preservation of ovaries.  Unclear where the HSIL cells were arising and MRI showed normal cervix.  Long residual cervix removed completely 09/21/18 via Cheney incision.  Upper vaginal tissue looked normal after cervix amputated.  Pathology did not show any evidence of HSIL. PAP 12/01/18 normal and no endocervical cells present.  HPV positive.  Persistent circumferential band in upper vagina, and vagina above this feels normal.    Small left paraovarian cyst associated with pain and removed with BSO and there was benign pathology.  Hot flashes.    Plan:   Problem List Items Addressed This Visit      Other   HSIL on Pap smear of cervix - Primary   Relevant Orders   Pap liquid-based and HPV (high risk)     The cervix was clearly removed completely and it was presumed that this was where the abnormal cells on PAP  orignated, but no HSIL was found on pathology.  Last PAP 1/20  normal, but HPV positive.  There is a fibrous circumferential band above the mid vagina and the top of the vagina tapers a bit. No lesions seen, but not able to see the top of the vagina well.  However, I can palpate this and it feels normal.   PAP/HPV done today.  There could have just been a small focus of  HSIL in the cervix that was not seen on path, but it is possible that the abnormal cells are coming from the vagina.  No obvious lesion has been seen in the vagina, but would try to attempt vaginal colposcopy although hard to see the apex due to the vaginal scar tissue.  Will contact her with results of PAP/HPV.  Will RTC in 6 months unless PAP abnormal.   The patient's diagnosis, an outline of the further diagnostic and laboratory studies which will be required, the recommendation for surgery, and alternatives were discussed with her.  All questions were answered to their satisfaction.  Beckey Rutter, DNP, AGNP-C [place] at [facility] [phone] (work cell) [phone] (office)  I personally interviewed and examined the patient. Agreed with the above/below plan of care. Patient/family questions were answered.  Mellody Drown, MD  CC:  Glean Hess, [place],   [id number] [phone]

## 2019-06-02 ENCOUNTER — Other Ambulatory Visit
Admission: RE | Admit: 2019-06-02 | Discharge: 2019-06-02 | Disposition: A | Discharge status: Home / Self Care | Payer: BC Managed Care – PPO | Source: Ambulatory Visit | Attending: Gastroenterology | Admitting: Gastroenterology

## 2019-06-03 NOTE — Discharge Instructions (Signed)

## 2019-06-06 ENCOUNTER — Ambulatory Visit: Admit: 2019-06-06 | Payer: BC Managed Care – PPO | Admitting: Gastroenterology

## 2019-06-06 HISTORY — DX: Unspecified osteoarthritis, unspecified site: M19.90

## 2019-06-06 SURGERY — COLONOSCOPY WITH PROPOFOL
Anesthesia: Choice

## 2019-06-09 ENCOUNTER — Other Ambulatory Visit: Payer: Self-pay

## 2019-06-09 ENCOUNTER — Ambulatory Visit
Admission: RE | Admit: 2019-06-09 | Discharge: 2019-06-09 | Disposition: A | Discharge status: Home / Self Care | Payer: BC Managed Care – PPO | Source: Ambulatory Visit | Attending: Obstetrics and Gynecology | Admitting: Obstetrics and Gynecology

## 2019-06-09 DIAGNOSIS — Z1231 Encounter for screening mammogram for malignant neoplasm of breast: Secondary | ICD-10-CM | POA: Diagnosis not present

## 2019-06-09 LAB — PAP LB AND HPV HIGH-RISK: HPV, high-risk: NEGATIVE

## 2019-06-20 ENCOUNTER — Telehealth: Payer: Self-pay

## 2019-06-20 NOTE — Telephone Encounter (Signed)
Called Toni Kelly with results of pap smear.  NEGATIVE FOR INTRAEPITHELIAL LESION OR MALIGNANCY HPV  NEGATIVE

## 2019-06-22 ENCOUNTER — Ambulatory Visit: Payer: BLUE CROSS/BLUE SHIELD | Admitting: Obstetrics and Gynecology

## 2019-06-27 ENCOUNTER — Other Ambulatory Visit: Payer: Self-pay

## 2019-06-27 ENCOUNTER — Encounter: Payer: Self-pay | Admitting: Internal Medicine

## 2019-06-27 DIAGNOSIS — K5904 Chronic idiopathic constipation: Secondary | ICD-10-CM

## 2019-06-27 MED ORDER — TRULANCE 3 MG PO TABS: 3.0000 mg | ORAL_TABLET | Freq: Every day | ORAL | 5 refills | Status: DC

## 2019-07-21 DIAGNOSIS — H40003 Preglaucoma, unspecified, bilateral: Secondary | ICD-10-CM | POA: Diagnosis not present

## 2019-07-26 ENCOUNTER — Encounter: Payer: Self-pay | Admitting: Internal Medicine

## 2019-08-10 ENCOUNTER — Other Ambulatory Visit: Payer: Self-pay | Admitting: Internal Medicine

## 2019-08-10 DIAGNOSIS — M7061 Trochanteric bursitis, right hip: Secondary | ICD-10-CM | POA: Diagnosis not present

## 2019-08-10 DIAGNOSIS — M545 Low back pain: Secondary | ICD-10-CM | POA: Diagnosis not present

## 2019-08-10 DIAGNOSIS — M7062 Trochanteric bursitis, left hip: Secondary | ICD-10-CM | POA: Diagnosis not present

## 2019-08-10 DIAGNOSIS — G8929 Other chronic pain: Secondary | ICD-10-CM | POA: Diagnosis not present

## 2019-08-10 DIAGNOSIS — M5136 Other intervertebral disc degeneration, lumbar region: Secondary | ICD-10-CM | POA: Diagnosis not present

## 2019-08-10 DIAGNOSIS — K219 Gastro-esophageal reflux disease without esophagitis: Secondary | ICD-10-CM

## 2019-09-06 ENCOUNTER — Other Ambulatory Visit: Payer: Self-pay | Admitting: Obstetrics and Gynecology

## 2019-10-03 ENCOUNTER — Other Ambulatory Visit: Payer: Self-pay | Admitting: Obstetrics and Gynecology

## 2019-10-03 NOTE — Telephone Encounter (Signed)
Please advise, pt cancelled her annual in July.

## 2019-11-17 ENCOUNTER — Other Ambulatory Visit: Payer: Self-pay | Admitting: Internal Medicine

## 2019-11-17 DIAGNOSIS — E782 Mixed hyperlipidemia: Secondary | ICD-10-CM

## 2019-11-29 ENCOUNTER — Encounter: Payer: Self-pay | Admitting: Internal Medicine

## 2019-12-06 ENCOUNTER — Other Ambulatory Visit: Payer: Self-pay

## 2019-12-06 NOTE — Progress Notes (Signed)
Patient pre screened for office appointment, no questions or concerns today. Patient reminded of upcoming appointment time and date. 

## 2019-12-07 ENCOUNTER — Other Ambulatory Visit: Payer: Self-pay

## 2019-12-07 ENCOUNTER — Inpatient Hospital Stay: Payer: BC Managed Care – PPO | Attending: Obstetrics and Gynecology | Admitting: Obstetrics and Gynecology

## 2019-12-07 VITALS — BP 187/97 | HR 76 | Temp 98.4°F | Resp 18 | Wt 270.7 lb

## 2019-12-07 DIAGNOSIS — Z79899 Other long term (current) drug therapy: Secondary | ICD-10-CM | POA: Insufficient documentation

## 2019-12-07 DIAGNOSIS — K219 Gastro-esophageal reflux disease without esophagitis: Secondary | ICD-10-CM | POA: Diagnosis not present

## 2019-12-07 DIAGNOSIS — R87613 High grade squamous intraepithelial lesion on cytologic smear of cervix (HGSIL): Secondary | ICD-10-CM | POA: Diagnosis not present

## 2019-12-07 DIAGNOSIS — R232 Flushing: Secondary | ICD-10-CM | POA: Diagnosis not present

## 2019-12-07 DIAGNOSIS — M5030 Other cervical disc degeneration, unspecified cervical region: Secondary | ICD-10-CM | POA: Insufficient documentation

## 2019-12-07 DIAGNOSIS — N951 Menopausal and female climacteric states: Secondary | ICD-10-CM | POA: Insufficient documentation

## 2019-12-07 DIAGNOSIS — Z90711 Acquired absence of uterus with remaining cervical stump: Secondary | ICD-10-CM | POA: Diagnosis not present

## 2019-12-07 DIAGNOSIS — Z90722 Acquired absence of ovaries, bilateral: Secondary | ICD-10-CM | POA: Diagnosis not present

## 2019-12-07 DIAGNOSIS — A630 Anogenital (venereal) warts: Secondary | ICD-10-CM

## 2019-12-07 DIAGNOSIS — I10 Essential (primary) hypertension: Secondary | ICD-10-CM | POA: Diagnosis not present

## 2019-12-07 DIAGNOSIS — M719 Bursopathy, unspecified: Secondary | ICD-10-CM | POA: Diagnosis not present

## 2019-12-07 DIAGNOSIS — E782 Mixed hyperlipidemia: Secondary | ICD-10-CM | POA: Diagnosis not present

## 2019-12-07 NOTE — Progress Notes (Signed)
Pt denies any concerns today and reports nurse called for preassessment yesterday.

## 2019-12-07 NOTE — Progress Notes (Signed)
Gynecologic Oncology Interval Visit   Referring Provider: Dr. Georgianne Fick  Chief Complaint: HSIL of uncertain origin  Subjective:  Toni Kelly is a 59 y.o. female, initially seen in consultation from Dr. Georgianne Fick for HSIL PAP s/p supracervical hysterectomy, returns to clinic today for follow-up and pap smear  At last visit Pap smear was performed. PAP 12/01/18 normal and no endocervical cells present. HPV negative Pap 06/01/2019 NILM, HPV negative  No vaginal discharge, bleeding or other concerning symptoms.   Tried Effexor for hot flashes per Dr Georgianne Fick which didn't help. She has now tried neurontin.     History Pap results were as follows: 05/23/2016-HSIL HPV positive with negative colposcopy (per Dr. Danielle Rankin note- results not available)  06/15/2017-ASCUS; HR HPV positive.    07/31/2017-LEEP ectocervix.  Pathology revealed chronic cervicitis.  Negative for dysplasia, viral effect, and malignancy.  Transformation zone was not well-defined.  06/16/18- HSIL, HPV positive.  Negative for HPV 16/18/45  Per Dr. Georgianne Fick, minimal amount of cervical tissue remaining after prior hysterectomy and LEEP in 2018.  She has history of laparoscopic supracervical hysterectomy w/ Dr. Davis Gourd for painful menstrual periods in her 50s.    Last mammogram: 06/07/2018 BI-RADS Category 1  She denies any bleeding or discharge. Denies post-coital bleeding. She does not smoke. She has some chronic pain in legs and back that is managed by PCP.   MRI 07/16/18- to evaluate cervix more closely in view of HSIL PAP Cervix is within normal limits. Specifically, the fibrocervical stroma is intact. No findings suspicious for cervical cancer on MRI, noting that early lesions (CIN or stage IA) may be occult by imaging. Nabothian cysts. 2.3 cm left paraovarian cyst.  No right adnexal mass.  On 09/21/18, she went to [facility] for surgery with Dr. Fransisca Connors. Cervix could not be removed vaginally or laparoscopically, so she had  laparotomy with cherney incision, trachelectomy, BSO, lysis of adhesions. There was a long cervical remnant still present and this was clearly completely removed, but no dysplasia found in cervix on pathology.      PATHOLOGY:  A.  Periumbilical nodule: Dermatofibroma.  B.  Right fallopian tube and ovary: Ovary: Cortical inclusion cyst. Fallopian tube: Paratubal cyst.  C.  Left fallopian tube and ovary: Ovary: No specific pathologic change.  Fallopian tube: Paratubal cysts (up to 2.9 cm).  D.  Cervix: Squamous metaplasia. Leiomyoma (0.3 cm). There is no evidence of malignancy. (Levels examined.)  PAP 12/01/18 normal and no endocervical cells present.  HPV positive.      Problem List: Patient Active Problem List   Diagnosis Date Noted  . Constipation 12/13/2018  . Abnormal EKG 09/14/2018  . DDD (degenerative disc disease), cervical 08/26/2018  . Gastroesophageal reflux disease 01/14/2018  . HSIL on Pap smear of cervix 05/23/2016  . Localized edema 10/31/2015  . Elbow tendonitis 10/31/2015  . Mixed hyperlipidemia 09/28/2015  . Anal fistula 07/21/2013    Past Medical History: Past Medical History:  Diagnosis Date  . Arthritis    lower back  . Bursitis   . Esophageal reflux   . Hemorrhoids 2011  . HPV (human papilloma virus) infection   . Hyperlipidemia   . Hypertension     Past Surgical History: Past Surgical History:  Procedure Laterality Date  . ABDOMINAL HYSTERECTOMY  08/2018   abnormal pap  . CERVICAL BIOPSY  W/ LOOP ELECTRODE EXCISION  05/2017   [facility]  . COLONOSCOPY  2011   @ [facility]  . hemorrhoids banding  2011  . TONSILLECTOMY  Past Gynecologic History:  G2P2 History of abnormal Pap smears: Yes Last Pap: Per HPI Contraception: Post partial hysterectomy @ ~ age 62 Sexually active: Yes History of STDs: denies  OB History:  OB History  Gravida Para Term Preterm AB Living  2 2       2   SAB TAB Ectopic Multiple Live Births               #  Outcome Date GA Lbr Len/2nd Weight Sex Delivery Anes PTL Lv  2 Para           1 Para             Obstetric Comments  1st Menstrual Cycle:  15  1st Pregnancy:  54    Family History: Family History  Problem Relation Age of Onset  . Breast cancer Maternal Aunt 26  . Breast cancer Cousin 79  . Heart failure Mother   . Lung cancer Maternal Uncle   . Lung cancer Maternal Aunt   . Lung cancer Maternal Uncle     Social History: Social History   Socioeconomic History  . Marital status: Married    Spouse name: Not on file  . Number of children: Not on file  . Years of education: Not on file  . Highest education level: Not on file  Occupational History  . Not on file  Tobacco Use  . Smoking status: Never Smoker  . Smokeless tobacco: Never Used  Substance and Sexual Activity  . Alcohol use: Yes    Alcohol/week: 0.0 standard drinks    Comment: rarely  . Drug use: No  . Sexual activity: Yes    Birth control/protection: Surgical  Other Topics Concern  . Not on file  Social History Narrative  . Not on file   Social Determinants of Health   Financial Resource Strain:   . Difficulty of Paying Living Expenses: Not on file  Food Insecurity:   . Worried About Charity fundraiser in the Last Year: Not on file  . Ran Out of Food in the Last Year: Not on file  Transportation Needs:   . Lack of Transportation (Medical): Not on file  . Lack of Transportation (Non-Medical): Not on file  Physical Activity:   . Days of Exercise per Week: Not on file  . Minutes of Exercise per Session: Not on file  Stress:   . Feeling of Stress : Not on file  Social Connections:   . Frequency of Communication with Friends and Family: Not on file  . Frequency of Social Gatherings with Friends and Family: Not on file  . Attends Religious Services: Not on file  . Active Member of Clubs or Organizations: Not on file  . Attends Archivist Meetings: Not on file  . Marital Status: Not on file   Intimate Partner Violence:   . Fear of Current or Ex-Partner: Not on file  . Emotionally Abused: Not on file  . Physically Abused: Not on file  . Sexually Abused: Not on file    Allergies: Allergies  Allergen Reactions  . Codeine Itching  . Oxycodone Itching  . Ultram [Tramadol] Itching    Current Medications: Current Outpatient Medications  Medication Sig Dispense Refill  . gabapentin (NEURONTIN) 300 MG capsule TAKE 1 CAPSULE BY MOUTH AT BEDTIME FOR 7 DAYS, THEN TAKE 1 CAPSULE TWICE A DAY FOR MAINTENANCE 540 capsule 1  . hydrochlorothiazide (HYDRODIURIL) 25 MG tablet Take 1 tablet (25 mg total) by mouth daily.  90 tablet 3  . Multiple Vitamins-Minerals (WOMENS 50+ MULTI VITAMIN/MIN PO) Take 1 tablet by mouth.    . nystatin (MYCOSTATIN/NYSTOP) powder Apply topically 4 (four) times daily. 15 g 0  . omeprazole (PRILOSEC) 20 MG capsule TAKE 1 CAPSULE (20 MG TOTAL) BY MOUTH 2 (TWO) TIMES DAILY BEFORE A MEAL. 180 capsule 1  . Plecanatide (TRULANCE) 3 MG TABS Take 3 mg by mouth daily at 2 PM. 30 tablet 5  . simvastatin (ZOCOR) 20 MG tablet TAKE 1 TABLET BY MOUTH EVERY DAY 90 tablet 3   No current facility-administered medications for this visit.   Review of Systems General:  no complaints Skin: no complaints Eyes: no complaints HEENT: no complaints Breasts: no complaints Pulmonary: no complaints Cardiac: no complaints Gastrointestinal: no complaints Genitourinary/Sexual: no complaints Ob/Gyn: no complaints Musculoskeletal: no complaints Hematology: no complaints Neurologic/Psych: no complaints   Objective:  Physical Examination:  BP (!) 187/97 (BP Location: Left Arm, Patient Position: Sitting)   Pulse 76   Temp 98.4 F (36.9 C) (Tympanic)   Resp 18   Wt 270 lb 11.2 oz (122.8 kg)   SpO2 100%   BMI 46.47 kg/m    ECOG Performance Status: 0 - Asymptomatic  GENERAL: Patient is a well appearing female in no acute distress NODES:  No cervical, supraclavicular, axillary,  or inguinal lymphadenopathy palpated.  LUNGS:  Clear to auscultation bilaterally.  No wheezes or rhonchi. HEART:  Regular rate and rhythm. No murmur appreciated. ABDOMEN:  Soft, nontender.  Positive, normoactive bowel sounds.  Laparoscopic incisions well-healed. Some yeast under pannus.  At last visit, granulation tissue of Pfannenstiel incision was cauterized with silver nitrate. MSK:  No focal spinal tenderness to palpation. Full range of motion bilaterally in the upper extremities. EXTREMITIES:  No peripheral edema.   SKIN:  Clear with no obvious rashes or skin changes. No nail dyscrasia. NEURO:  Nonfocal. Well oriented.  Appropriate affect.  Pelvic: Exam Chaperoned by NP EGBUS: no lesions Vagina: There is still a fibrous circumferential band above the mid vagina and the top of the vagina tapers above that. No lesions seen, but not able to see the top of the vagina well.        Bimanual: no masses or tenderness.  Top of vagina intact. Can feel this area well and it is smooth. Rectovaginal: confirmed.     Assessment:  Toni Kelly is a 59 y.o. P2 female diagnosed with persistent HSIL PAP, HR HPV s/p negative LEEP 9/18 and multiple colposcopies. PAP 7/19 HSIL/HPV negative.  She had prior supracervical hysterectomy in remote past for menorrhagia with preservation of ovaries.  Unclear where the HSIL cells were arising and MRI showed normal cervix.  Long residual cervix removed completely 09/21/18 via Cheney incision.  Upper vaginal tissue looked normal after cervix amputated.  Pathology did not show any evidence of HSIL. PAP 12/01/18 normal and no endocervical cells present.  HPV positive.  Persistent circumferential band in upper vagina, and vagina above this feels normal.  Pap 06/01/2019 NILM, HPV negative  Small left paraovarian cyst associated with pain and removed with BSO and there was benign pathology.  Hot flashes.    Plan:   Problem List Items Addressed This Visit      Other   HSIL  on Pap smear of cervix - Primary     The cervix was clearly removed completely and it was presumed that this was where the abnormal cells on PAP orignated, but no HSIL was found on pathology.  PAP 1/20 normal, but HPV positive.  There is a fibrous circumferential band above the mid vagina and the top of the vagina tapers a bit. No lesions seen, but not able to see the top of the vagina well. However, I can palpate this and it feels normal.   PAP/HPV done today.  There could have just been a small focus of  HSIL in the cervix that was not seen on path, but it is possible that the abnormal cells are coming from the vagina.  No obvious lesion has been seen in the vagina, could try to attempt vaginal colposcopy although hard to see the apex due to the vaginal scar tissue.  Will contact her with results of PAP/HPV.  Will RTC in 12 months unless PAP/HPV abnormal.   The patient's diagnosis, an outline of the further diagnostic and laboratory studies which will be required, the recommendation for surgery, and alternatives were discussed with her.  All questions were answered to their satisfaction.  Beckey Rutter, DNP, AGNP-C [place] [facility] [phone] (clinic)  I personally interviewed and examined the patient. Agreed with the above/below plan of care. Patient/family questions were answered.  Mellody Drown, MD  CC:  Glean Hess, [address] [place] [facility],  [place] [address] [phone]

## 2019-12-13 ENCOUNTER — Encounter: Payer: Self-pay | Admitting: Internal Medicine

## 2019-12-13 NOTE — Telephone Encounter (Signed)
Pt response to my chart message.

## 2019-12-14 ENCOUNTER — Telehealth: Payer: Self-pay

## 2019-12-14 LAB — IGP, APTIMA HPV: HPV Aptima: NEGATIVE

## 2019-12-14 NOTE — Telephone Encounter (Signed)
Voicemail left with Toni Kelly to return call for pap smear results and arrange 6 month follow up for ASCUS

## 2019-12-15 ENCOUNTER — Telehealth: Payer: Self-pay | Admitting: Internal Medicine

## 2019-12-15 ENCOUNTER — Telehealth: Payer: Self-pay

## 2019-12-15 ENCOUNTER — Encounter: Payer: Self-pay | Admitting: Internal Medicine

## 2019-12-15 ENCOUNTER — Other Ambulatory Visit: Payer: Self-pay

## 2019-12-15 ENCOUNTER — Ambulatory Visit (INDEPENDENT_AMBULATORY_CARE_PROVIDER_SITE_OTHER): Payer: BC Managed Care – PPO | Admitting: Internal Medicine

## 2019-12-15 VITALS — BP 128/80 | HR 80 | Temp 98.5°F | Ht 64.0 in | Wt 263.0 lb

## 2019-12-15 DIAGNOSIS — K5904 Chronic idiopathic constipation: Secondary | ICD-10-CM

## 2019-12-15 DIAGNOSIS — R600 Localized edema: Secondary | ICD-10-CM

## 2019-12-15 DIAGNOSIS — Z1211 Encounter for screening for malignant neoplasm of colon: Secondary | ICD-10-CM

## 2019-12-15 DIAGNOSIS — Z0000 Encounter for general adult medical examination without abnormal findings: Secondary | ICD-10-CM | POA: Diagnosis not present

## 2019-12-15 DIAGNOSIS — R739 Hyperglycemia, unspecified: Secondary | ICD-10-CM | POA: Diagnosis not present

## 2019-12-15 DIAGNOSIS — K219 Gastro-esophageal reflux disease without esophagitis: Secondary | ICD-10-CM

## 2019-12-15 DIAGNOSIS — Z6841 Body Mass Index (BMI) 40.0 and over, adult: Secondary | ICD-10-CM

## 2019-12-15 LAB — POCT URINALYSIS DIPSTICK
Bilirubin, UA: NEGATIVE
Blood, UA: NEGATIVE
Glucose, UA: NEGATIVE
Ketones, UA: NEGATIVE
Leukocytes, UA: NEGATIVE
Nitrite, UA: NEGATIVE
Protein, UA: NEGATIVE
Spec Grav, UA: 1.015 (ref 1.010–1.025)
Urobilinogen, UA: 0.2 E.U./dL
pH, UA: 6 (ref 5.0–8.0)

## 2019-12-15 MED ORDER — LACTULOSE 10 GM/15ML PO SOLN: 10.0000 g | Freq: Every day | ORAL | 1 refills | Status: DC

## 2019-12-15 MED ORDER — OMEPRAZOLE 20 MG PO CPDR: 20.0000 mg | DELAYED_RELEASE_CAPSULE | Freq: Two times a day (BID) | ORAL | 3 refills | Status: DC

## 2019-12-15 MED ORDER — LINACLOTIDE 290 MCG PO CAPS: 290.0000 ug | ORAL_CAPSULE | Freq: Every day | ORAL | 0 refills | Status: DC

## 2019-12-15 MED ORDER — HYDROCHLOROTHIAZIDE 25 MG PO TABS: 25.0000 mg | ORAL_TABLET | Freq: Every day | ORAL | 3 refills | Status: DC

## 2019-12-15 NOTE — Telephone Encounter (Signed)
Completed PA and just waiting for insurance reply in the next 3 business days.

## 2019-12-15 NOTE — Progress Notes (Signed)
Date:  12/15/2019   Name:  Toni Kelly   DOB:  05-Feb-1961   MRN:  [id number]   Chief Complaint: Annual Exam (Breast Exam. No pap- hyst.) Toni Kelly is a 59 y.o. female who presents today for her Complete Annual Exam. She feels fairly well. She reports exercising rarely. She reports she is sleeping well. No breast issues.  Sees GYN.  Mammogram  05/2019 Pap - discontinued Colonoscopy  04/2010 Immunization History  Administered Date(s) Administered  . Influenza,inj,Quad PF,6+ Mos 08/08/2018  . Influenza,inj,quad, With Preservative 07/26/2019  . Influenza-Unspecified 09/09/2015, 09/07/2017  . Pneumococcal Polysaccharide-23 07/26/2019  . Zoster Recombinat (Shingrix) 03/21/2019, 07/29/2019    Gastroesophageal Reflux She complains of heartburn. She reports no abdominal pain, no chest pain, no coughing, no dysphagia, no sore throat, no water brash or no wheezing. The problem occurs rarely. Pertinent negatives include no fatigue. She has tried a PPI for the symptoms. The treatment provided significant relief.  Hyperlipidemia This is a chronic problem. The problem is controlled. Pertinent negatives include no chest pain or shortness of breath. Current antihyperlipidemic treatment includes statins. The current treatment provides significant improvement of lipids. There are no compliance problems.   Constipation This is a chronic problem. The problem is unchanged. Her stool frequency is 2 to 3 times per week. The stool is described as pellet like. The patient is not on a high fiber diet. She does not exercise regularly. There has been adequate water intake. Pertinent negatives include no abdominal pain, fever or rectal pain. Treatments tried: good response to Trulance but can not afford it.    Lab Results  Component Value Date   CREATININE 0.92 12/13/2018   BUN 11 12/13/2018   NA 144 12/13/2018   K 4.6 12/13/2018   CL 101 12/13/2018   CO2 26 12/13/2018   Lab Results  Component  Value Date   CHOL 174 12/13/2018   HDL 63 12/13/2018   LDLCALC 84 12/13/2018   TRIG 134 12/13/2018   CHOLHDL 2.8 12/13/2018   Lab Results  Component Value Date   TSH 1.120 04/20/2019   No results found for: HGBA1C   Review of Systems  Constitutional: Negative for chills, fatigue, fever and unexpected weight change.  HENT: Negative for sore throat and trouble swallowing.   Respiratory: Negative for cough, chest tightness, shortness of breath and wheezing.   Cardiovascular: Negative for chest pain, palpitations and leg swelling.  Gastrointestinal: Positive for constipation and heartburn. Negative for abdominal pain, blood in stool, dysphagia and rectal pain.  Skin: Negative for color change and rash.  Neurological: Negative for dizziness, light-headedness and headaches.  Hematological: Negative for adenopathy.  Psychiatric/Behavioral: Negative for dysphoric mood and sleep disturbance. The patient is not nervous/anxious.     Patient Active Problem List   Diagnosis Date Noted  . Morbid obesity with BMI of 40.0-44.9, adult ([place]) 08/10/2019  . Constipation 12/13/2018  . Abnormal EKG 09/14/2018  . DDD (degenerative disc disease), lumbar 08/26/2018  . Gastroesophageal reflux disease 01/14/2018  . HSIL on Pap smear of cervix 05/23/2016  . Localized edema 10/31/2015  . Elbow tendonitis 10/31/2015  . Mixed hyperlipidemia 09/28/2015  . Anal fistula 07/21/2013    Allergies  Allergen Reactions  . Codeine Itching  . Oxycodone Itching  . Ultram [Tramadol] Itching    Past Surgical History:  Procedure Laterality Date  . ABDOMINAL HYSTERECTOMY  08/2018   abnormal pap  . CERVICAL BIOPSY  W/ LOOP ELECTRODE EXCISION  05/2017   [facility]  .  COLONOSCOPY  2011   @ [facility]  . hemorrhoids banding  2011  . TONSILLECTOMY      Social History   Tobacco Use  . Smoking status: Never Smoker  . Smokeless tobacco: Never Used  Substance Use Topics  . Alcohol use: Yes    Alcohol/week: 0.0  standard drinks    Comment: rarely  . Drug use: No     Medication list has been reviewed and updated.  Current Meds  Medication Sig  . diclofenac (VOLTAREN) 75 MG EC tablet Take by mouth.  . hydrochlorothiazide (HYDRODIURIL) 25 MG tablet Take 1 tablet (25 mg total) by mouth daily.  . Multiple Vitamins-Minerals (WOMENS 50+ MULTI VITAMIN/MIN PO) Take 1 tablet by mouth.  Marland Kitchen omeprazole (PRILOSEC) 20 MG capsule TAKE 1 CAPSULE (20 MG TOTAL) BY MOUTH 2 (TWO) TIMES DAILY BEFORE A MEAL.  Marland Kitchen Plecanatide (TRULANCE) 3 MG TABS Take 3 mg by mouth daily at 2 PM.  . simvastatin (ZOCOR) 20 MG tablet TAKE 1 TABLET BY MOUTH EVERY DAY    PHQ 2/9 Scores 12/15/2019 12/31/2018 12/13/2018 01/14/2018  PHQ - 2 Score 0 0 0 0  PHQ- 9 Score - - - 0    BP Readings from Last 3 Encounters:  12/15/19 128/80  12/07/19 (!) 187/97  06/01/19 139/85    Physical Exam Vitals and nursing note reviewed.  Constitutional:      General: She is not in acute distress.    Appearance: She is well-developed. She is obese.  HENT:     Head: Normocephalic and atraumatic.     Right Ear: Tympanic membrane and ear canal normal.     Left Ear: Tympanic membrane and ear canal normal.     Nose:     Right Sinus: No maxillary sinus tenderness.     Left Sinus: No maxillary sinus tenderness.  Eyes:     General: No scleral icterus.       Right eye: No discharge.        Left eye: No discharge.     Conjunctiva/sclera: Conjunctivae normal.  Neck:     Thyroid: No thyromegaly.     Vascular: No carotid bruit.  Cardiovascular:     Rate and Rhythm: Normal rate and regular rhythm.     Pulses: Normal pulses.     Heart sounds: Normal heart sounds. No murmur.  Pulmonary:     Effort: Pulmonary effort is normal. No respiratory distress.     Breath sounds: No wheezing.  Abdominal:     General: Bowel sounds are normal.     Palpations: Abdomen is soft.     Tenderness: There is no abdominal tenderness.  Musculoskeletal:        General: Normal  range of motion.     Cervical back: Normal range of motion. No erythema.     Right lower leg: No edema.     Left lower leg: No edema.  Lymphadenopathy:     Cervical: No cervical adenopathy.  Skin:    General: Skin is warm and dry.     Capillary Refill: Capillary refill takes less than 2 seconds.     Findings: No rash.  Neurological:     General: No focal deficit present.     Mental Status: She is alert and oriented to person, place, and time.     Cranial Nerves: No cranial nerve deficit.     Sensory: No sensory deficit.     Deep Tendon Reflexes: Reflexes are normal and symmetric.  Psychiatric:  Attention and Perception: Attention normal.        Mood and Affect: Mood normal.        Speech: Speech normal.        Behavior: Behavior normal.        Thought Content: Thought content normal.     Wt Readings from Last 3 Encounters:  12/15/19 263 lb (119.3 kg)  12/07/19 270 lb 11.2 oz (122.8 kg)  06/01/19 257 lb 9.6 oz (116.8 kg)    BP 128/80   Pulse 80   Temp 98.5 F (36.9 C) (Oral)   Ht 5\' 4"  (1.626 m)   Wt 263 lb (119.3 kg)   SpO2 97%   BMI 45.14 kg/m   Assessment and Plan: 1. Annual physical exam Recommend adding regular exercise such as walking to regimen Followed by GYN for mammograms/pelvic exams - Lipid panel - POCT urinalysis dipstick  2. Localized edema Controlled with daily hctz - hydrochlorothiazide (HYDRODIURIL) 25 MG tablet; Take 1 tablet (25 mg total) by mouth daily.  Dispense: 90 tablet; Refill: 3 - Comprehensive metabolic panel - TSH  3. Chronic idiopathic constipation Did well with Trulance but can not afford Samples of Linzess 290 given Will prescribe both lactulose and Linzess to which one is affordable - lactulose (CHRONULAC) 10 GM/15ML solution; Take 15 mLs (10 g total) by mouth daily.  Dispense: 1892 mL; Refill: 1 - linaclotide (LINZESS) 290 MCG CAPS capsule; Take 1 capsule (290 mcg total) by mouth daily before breakfast.  Dispense: 30  capsule; Refill: 0  4. Morbid obesity with BMI of 40.0-44.9, adult ([place]) She needs to start some regular exercise and dietary modifications  5. Gastroesophageal reflux disease Symptoms well controlled on daily PPI No red flag signs such as weight loss, n/v, melena Will continue omeprazole 20 mg. - omeprazole (PRILOSEC) 20 MG capsule; Take 1 capsule (20 mg total) by mouth 2 (two) times daily before a meal.  Dispense: 180 capsule; Refill: 3 - CBC with Differential/Platelet  6. Colon cancer screening Due for 10 yr colonoscopy - Ambulatory referral to Gastroenterology   Partially dictated using Dragon software. Any errors are unintentional.  Halina Maidens, MD [facility]  12/15/2019

## 2019-12-15 NOTE — Telephone Encounter (Signed)
Spoke with Toni Kelly. Provided results of pap smear/hpv. Educated on ASCUS. Per Dr. Fransisca Connors follow up in 6 months instead of one year. Appointment changed to 05/2020.

## 2019-12-15 NOTE — Telephone Encounter (Signed)
Pt told by pharmacy medication needs a prior auth   linaclotide [facility]) 290 MCG CAPS capsule [id number]

## 2019-12-15 NOTE — Telephone Encounter (Signed)
Completed PA on [email or web address] for Linzess 290 caps.  (Key: [id number])  "Your information has been submitted to [place]. [employer] [place] will review the request and fax you a determination directly, typically within 3 business days of your submission once all necessary information is received.  If [employer] [place] has not responded in 3 business days or if you have any questions about your submission, contact [place] at [phone]."  Awaiting outcome.

## 2019-12-16 ENCOUNTER — Other Ambulatory Visit: Payer: Self-pay

## 2019-12-16 ENCOUNTER — Encounter: Payer: Self-pay | Admitting: Internal Medicine

## 2019-12-16 ENCOUNTER — Telehealth: Payer: Self-pay

## 2019-12-16 DIAGNOSIS — Z1211 Encounter for screening for malignant neoplasm of colon: Secondary | ICD-10-CM

## 2019-12-16 LAB — CBC WITH DIFFERENTIAL/PLATELET
Basophils Absolute: 0.1 10*3/uL (ref 0.0–0.2)
Basos: 1 %
EOS (ABSOLUTE): 0.2 10*3/uL (ref 0.0–0.4)
Eos: 3 %
Hematocrit: 43.1 % (ref 34.0–46.6)
Hemoglobin: 14.2 g/dL (ref 11.1–15.9)
Immature Grans (Abs): 0 10*3/uL (ref 0.0–0.1)
Immature Granulocytes: 0 %
Lymphocytes Absolute: 1.9 10*3/uL (ref 0.7–3.1)
Lymphs: 31 %
MCH: 29.6 pg (ref 26.6–33.0)
MCHC: 32.9 g/dL (ref 31.5–35.7)
MCV: 90 fL (ref 79–97)
Monocytes Absolute: 0.6 10*3/uL (ref 0.1–0.9)
Monocytes: 10 %
Neutrophils Absolute: 3.4 10*3/uL (ref 1.4–7.0)
Neutrophils: 55 %
Platelets: 247 10*3/uL (ref 150–450)
RBC: 4.8 x10E6/uL (ref 3.77–5.28)
RDW: 12.8 % (ref 11.7–15.4)
WBC: 6.2 10*3/uL (ref 3.4–10.8)

## 2019-12-16 LAB — COMPREHENSIVE METABOLIC PANEL
ALT: 78 IU/L — ABNORMAL HIGH (ref 0–32)
AST: 86 IU/L — ABNORMAL HIGH (ref 0–40)
Albumin/Globulin Ratio: 1.4 (ref 1.2–2.2)
Albumin: 4.2 g/dL (ref 3.8–4.9)
Alkaline Phosphatase: 79 IU/L (ref 39–117)
BUN/Creatinine Ratio: 20 (ref 9–23)
BUN: 19 mg/dL (ref 6–24)
Bilirubin Total: 0.4 mg/dL (ref 0.0–1.2)
CO2: 24 mmol/L (ref 20–29)
Calcium: 9.4 mg/dL (ref 8.7–10.2)
Chloride: 104 mmol/L (ref 96–106)
Creatinine, Ser: 0.94 mg/dL (ref 0.57–1.00)
GFR calc Af Amer: 77 mL/min/{1.73_m2} (ref >59)
GFR calc non Af Amer: 67 mL/min/{1.73_m2} (ref >59)
Globulin, Total: 2.9 g/dL (ref 1.5–4.5)
Glucose: 126 mg/dL — ABNORMAL HIGH (ref 65–99)
Potassium: 4.7 mmol/L (ref 3.5–5.2)
Sodium: 143 mmol/L (ref 134–144)
Total Protein: 7.1 g/dL (ref 6.0–8.5)

## 2019-12-16 LAB — LIPID PANEL
Chol/HDL Ratio: 3.4 ratio (ref 0.0–4.4)
Cholesterol, Total: 185 mg/dL (ref 100–199)
HDL: 55 mg/dL (ref >39)
LDL Chol Calc (NIH): 101 mg/dL — ABNORMAL HIGH (ref 0–99)
Triglycerides: 166 mg/dL — ABNORMAL HIGH (ref 0–149)
VLDL Cholesterol Cal: 29 mg/dL (ref 5–40)

## 2019-12-16 LAB — TSH: TSH: 1.09 u[IU]/mL (ref 0.450–4.500)

## 2019-12-16 NOTE — Telephone Encounter (Signed)
Gastroenterology Pre-Procedure Review  Request Date: 02/10/20 Requesting Physician: Dr. Vicente Males  PATIENT REVIEW QUESTIONS: The patient responded to the following health history questions as indicated:    1. Are you having any GI issues? no 2. Do you have a personal history of Polyps? no 3. Do you have a family history of Colon Cancer or Polyps? no 4. Diabetes Mellitus? no 5. Joint replacements in the past 12 months?no 6. Major health problems in the past 3 months?no 7. Any artificial heart valves, MVP, or defibrillator?no    MEDICATIONS & ALLERGIES:    Patient reports the following regarding taking any anticoagulation/antiplatelet therapy:   Plavix, Coumadin, Eliquis, Xarelto, Lovenox, Pradaxa, Brilinta, or Effient? no Aspirin? no  Patient confirms/reports the following medications:  Current Outpatient Medications  Medication Sig Dispense Refill  . diclofenac (VOLTAREN) 75 MG EC tablet Take by mouth.    . hydrochlorothiazide (HYDRODIURIL) 25 MG tablet Take 1 tablet (25 mg total) by mouth daily. 90 tablet 3  . lactulose (CHRONULAC) 10 GM/15ML solution Take 15 mLs (10 g total) by mouth daily. 1892 mL 1  . linaclotide (LINZESS) 290 MCG CAPS capsule Take 1 capsule (290 mcg total) by mouth daily before breakfast. 30 capsule 0  . Multiple Vitamins-Minerals (WOMENS 50+ MULTI VITAMIN/MIN PO) Take 1 tablet by mouth.    Marland Kitchen omeprazole (PRILOSEC) 20 MG capsule Take 1 capsule (20 mg total) by mouth 2 (two) times daily before a meal. 180 capsule 3  . Plecanatide (TRULANCE) 3 MG TABS Take 3 mg by mouth daily at 2 PM. 30 tablet 5  . simvastatin (ZOCOR) 20 MG tablet TAKE 1 TABLET BY MOUTH EVERY DAY 90 tablet 3   No current facility-administered medications for this visit.    Patient confirms/reports the following allergies:  Allergies  Allergen Reactions  . Codeine Itching  . Oxycodone Itching  . Ultram [Tramadol] Itching    No orders of the defined types were placed in this  encounter.   AUTHORIZATION INFORMATION Primary Insurance: 1D#: Group #:  Secondary Insurance: 1D#: Group #:  SCHEDULE INFORMATION: Date: 02/10/20 Time: Location:[facility]

## 2019-12-16 NOTE — Progress Notes (Signed)
Patient informed - added A1C with [facility]. [id number] R73.9 code added for elevated glucose.

## 2019-12-19 ENCOUNTER — Telehealth: Payer: Self-pay

## 2019-12-19 NOTE — Telephone Encounter (Signed)
Received PA information back from [facility]. PA for Linzess 290 mcg has been approved through insurance. Informed patient through my chart.  Approved 12/15/19 - 12/13/2020

## 2019-12-20 ENCOUNTER — Encounter: Payer: Self-pay | Admitting: Internal Medicine

## 2019-12-20 LAB — SPECIMEN STATUS REPORT

## 2019-12-20 LAB — HGB A1C W/O EAG: Hgb A1c MFr Bld: 5.8 % — ABNORMAL HIGH (ref 4.8–5.6)

## 2020-01-17 ENCOUNTER — Telehealth: Payer: Self-pay | Admitting: Obstetrics and Gynecology

## 2020-01-17 NOTE — Telephone Encounter (Signed)
Sent test message to see if pt received mychart message.

## 2020-02-02 ENCOUNTER — Encounter: Payer: Self-pay | Admitting: Gastroenterology

## 2020-02-02 ENCOUNTER — Other Ambulatory Visit: Payer: Self-pay

## 2020-02-03 ENCOUNTER — Ambulatory Visit: Payer: BC Managed Care – PPO | Attending: Internal Medicine

## 2020-02-03 DIAGNOSIS — Z23 Encounter for immunization: Secondary | ICD-10-CM

## 2020-02-03 NOTE — Progress Notes (Signed)
   Covid-19 Vaccination Clinic  Name:  Toni Kelly    MRN: [id number] DOB: Jun 06, 1961  02/03/2020  [name] was observed post Covid-19 immunization for 15 minutes. without incident. She was provided with Vaccine Information Sheet and instruction to access the V-Safe system.   [name] was instructed to call 911 with any severe reactions post vaccine: Marland Kitchen Difficulty breathing  . Swelling of face and throat  . A fast heartbeat  . A bad rash all over body  . Dizziness and weakness   Immunizations Administered    Name Date Dose VIS Date Route   Pfizer COVID-19 Vaccine 02/03/2020  9:50 AM 0.3 mL 11/04/2019 Intramuscular   Manufacturer: [place]   Lot: [id number]   [place]: [id number]

## 2020-02-06 NOTE — Discharge Instructions (Signed)
General Anesthesia, Adult, Care After This sheet gives you information about how to care for yourself after your procedure. Your health care provider may also give you more specific instructions. If you have problems or questions, contact your health care provider. What can I expect after the procedure? After the procedure, the following side effects are common:  Pain or discomfort at the IV site.  Nausea.  Vomiting.  Sore throat.  Trouble concentrating.  Feeling cold or chills.  Weak or tired.  Sleepiness and fatigue.  Soreness and body aches. These side effects can affect parts of the body that were not involved in surgery. Follow these instructions at home:  For at least 24 hours after the procedure:  Have a responsible adult stay with you. It is important to have someone help care for you until you are awake and alert.  Rest as needed.  Do not: ? Participate in activities in which you could fall or become injured. ? Drive. ? Use heavy machinery. ? Drink alcohol. ? Take sleeping pills or medicines that cause drowsiness. ? Make important decisions or sign legal documents. ? Take care of children on your own. Eating and drinking  Follow any instructions from your health care provider about eating or drinking restrictions.  When you feel hungry, start by eating small amounts of foods that are soft and easy to digest (bland), such as toast. Gradually return to your regular diet.  Drink enough fluid to keep your urine pale yellow.  If you vomit, rehydrate by drinking water, juice, or clear broth. General instructions  If you have sleep apnea, surgery and certain medicines can increase your risk for breathing problems. Follow instructions from your health care provider about wearing your sleep device: ? Anytime you are sleeping, including during daytime naps. ? While taking prescription pain medicines, sleeping medicines, or medicines that make you drowsy.  Return to  your normal activities as told by your health care provider. Ask your health care provider what activities are safe for you.  Take over-the-counter and prescription medicines only as told by your health care provider.  If you smoke, do not smoke without supervision.  Keep all follow-up visits as told by your health care provider. This is important. Contact a health care provider if:  You have nausea or vomiting that does not get better with medicine.  You cannot eat or drink without vomiting.  You have pain that does not get better with medicine.  You are unable to pass urine.  You develop a skin rash.  You have a fever.  You have redness around your IV site that gets worse. Get help right away if:  You have difficulty breathing.  You have chest pain.  You have blood in your urine or stool, or you vomit blood. Summary  After the procedure, it is common to have a sore throat or nausea. It is also common to feel tired.  Have a responsible adult stay with you for the first 24 hours after general anesthesia. It is important to have someone help care for you until you are awake and alert.  When you feel hungry, start by eating small amounts of foods that are soft and easy to digest (bland), such as toast. Gradually return to your regular diet.  Drink enough fluid to keep your urine pale yellow.  Return to your normal activities as told by your health care provider. Ask your health care provider what activities are safe for you. This information is not   intended to replace advice given to you by your health care provider. Make sure you discuss any questions you have with your health care provider. Document Revised: 11/13/2017 Document Reviewed: 06/26/2017 Elsevier Patient Education  2020 Elsevier Inc.  

## 2020-02-08 ENCOUNTER — Other Ambulatory Visit: Payer: Self-pay

## 2020-02-08 ENCOUNTER — Other Ambulatory Visit
Admission: RE | Admit: 2020-02-08 | Discharge: 2020-02-08 | Disposition: A | Discharge status: Home / Self Care | Payer: BC Managed Care – PPO | Source: Ambulatory Visit | Attending: Gastroenterology | Admitting: Gastroenterology

## 2020-02-08 DIAGNOSIS — Z01812 Encounter for preprocedural laboratory examination: Secondary | ICD-10-CM | POA: Insufficient documentation

## 2020-02-08 DIAGNOSIS — Z20822 Contact with and (suspected) exposure to covid-19: Secondary | ICD-10-CM | POA: Diagnosis not present

## 2020-02-08 LAB — SARS CORONAVIRUS 2 (TAT 6-24 HRS): SARS Coronavirus 2: NEGATIVE

## 2020-02-09 ENCOUNTER — Other Ambulatory Visit: Payer: Self-pay | Admitting: Gastroenterology

## 2020-02-09 ENCOUNTER — Encounter: Payer: Self-pay | Admitting: Gastroenterology

## 2020-02-09 DIAGNOSIS — Z1211 Encounter for screening for malignant neoplasm of colon: Secondary | ICD-10-CM

## 2020-02-09 MED ORDER — GOLYTELY 236 G PO SOLR: 4000.0000 mL | Freq: Once | ORAL | 0 refills | Status: AC

## 2020-02-09 MED ORDER — ONDANSETRON HCL 4 MG PO TABS: 4.0000 mg | ORAL_TABLET | Freq: Three times a day (TID) | ORAL | 0 refills | Status: DC | PRN

## 2020-02-10 ENCOUNTER — Encounter: Payer: Self-pay | Admitting: Gastroenterology

## 2020-02-10 ENCOUNTER — Ambulatory Visit
Admission: RE | Admit: 2020-02-10 | Discharge: 2020-02-10 | Disposition: A | Discharge status: Home / Self Care | Payer: BC Managed Care – PPO | Attending: Gastroenterology | Admitting: Gastroenterology

## 2020-02-10 ENCOUNTER — Other Ambulatory Visit: Payer: Self-pay

## 2020-02-10 ENCOUNTER — Ambulatory Visit: Payer: BC Managed Care – PPO | Admitting: Anesthesiology

## 2020-02-10 ENCOUNTER — Encounter
Admission: RE | Disposition: A | Discharge status: Home / Self Care | Payer: Self-pay | Source: Home / Self Care | Attending: Gastroenterology

## 2020-02-10 DIAGNOSIS — I10 Essential (primary) hypertension: Secondary | ICD-10-CM | POA: Insufficient documentation

## 2020-02-10 DIAGNOSIS — K219 Gastro-esophageal reflux disease without esophagitis: Secondary | ICD-10-CM | POA: Insufficient documentation

## 2020-02-10 DIAGNOSIS — Z801 Family history of malignant neoplasm of trachea, bronchus and lung: Secondary | ICD-10-CM | POA: Insufficient documentation

## 2020-02-10 DIAGNOSIS — K635 Polyp of colon: Secondary | ICD-10-CM

## 2020-02-10 DIAGNOSIS — Z1211 Encounter for screening for malignant neoplasm of colon: Secondary | ICD-10-CM | POA: Diagnosis not present

## 2020-02-10 DIAGNOSIS — Z90710 Acquired absence of both cervix and uterus: Secondary | ICD-10-CM | POA: Insufficient documentation

## 2020-02-10 DIAGNOSIS — E785 Hyperlipidemia, unspecified: Secondary | ICD-10-CM | POA: Insufficient documentation

## 2020-02-10 DIAGNOSIS — Z8249 Family history of ischemic heart disease and other diseases of the circulatory system: Secondary | ICD-10-CM | POA: Insufficient documentation

## 2020-02-10 DIAGNOSIS — Z888 Allergy status to other drugs, medicaments and biological substances status: Secondary | ICD-10-CM | POA: Diagnosis not present

## 2020-02-10 DIAGNOSIS — Z803 Family history of malignant neoplasm of breast: Secondary | ICD-10-CM | POA: Diagnosis not present

## 2020-02-10 DIAGNOSIS — D124 Benign neoplasm of descending colon: Secondary | ICD-10-CM | POA: Insufficient documentation

## 2020-02-10 DIAGNOSIS — M1990 Unspecified osteoarthritis, unspecified site: Secondary | ICD-10-CM | POA: Diagnosis not present

## 2020-02-10 DIAGNOSIS — Z79899 Other long term (current) drug therapy: Secondary | ICD-10-CM | POA: Diagnosis not present

## 2020-02-10 DIAGNOSIS — Z885 Allergy status to narcotic agent status: Secondary | ICD-10-CM | POA: Diagnosis not present

## 2020-02-10 HISTORY — PX: COLONOSCOPY WITH PROPOFOL: SHX5780

## 2020-02-10 HISTORY — PX: POLYPECTOMY: SHX5525

## 2020-02-10 SURGERY — COLONOSCOPY WITH PROPOFOL
Anesthesia: General | Site: Rectum

## 2020-02-10 MED ORDER — LIDOCAINE HCL (CARDIAC) PF 100 MG/5ML IV SOSY
PREFILLED_SYRINGE | INTRAVENOUS | Status: DC | PRN
  Administered 2020-02-10: 30 mg via INTRAVENOUS

## 2020-02-10 MED ORDER — STERILE WATER FOR IRRIGATION IR SOLN
Status: DC | PRN
  Administered 2020-02-10: 100 mL

## 2020-02-10 MED ORDER — ACETAMINOPHEN 325 MG PO TABS: 325.0000 mg | ORAL_TABLET | ORAL | Status: DC | PRN

## 2020-02-10 MED ORDER — ONDANSETRON HCL 4 MG/2ML IJ SOLN: 4.0000 mg | Freq: Once | INTRAMUSCULAR | Status: DC | PRN

## 2020-02-10 MED ORDER — PROPOFOL 10 MG/ML IV BOLUS
INTRAVENOUS | Status: DC | PRN
  Administered 2020-02-10: 30 mg via INTRAVENOUS
  Administered 2020-02-10: 50 mg via INTRAVENOUS
  Administered 2020-02-10: 70 mg via INTRAVENOUS
  Administered 2020-02-10 (×4): 50 mg via INTRAVENOUS

## 2020-02-10 MED ORDER — ACETAMINOPHEN 160 MG/5ML PO SOLN: 325.0000 mg | ORAL | Status: DC | PRN

## 2020-02-10 MED ORDER — LACTATED RINGERS IV SOLN: INTRAVENOUS | Status: DC

## 2020-02-10 SURGICAL SUPPLY — 7 items
CANISTER SUCT 1200ML W/VALVE (MISCELLANEOUS) ×2 IMPLANT
GOWN CVR UNV OPN BCK APRN NK (MISCELLANEOUS) ×2 IMPLANT
GOWN ISOL THUMB LOOP REG UNIV (MISCELLANEOUS) ×2
KIT ENDO PROCEDURE OLY (KITS) ×2 IMPLANT
SNARE COLD EXACTO (MISCELLANEOUS) ×1 IMPLANT
TRAP ETRAP POLY (MISCELLANEOUS) ×1 IMPLANT
WATER STERILE IRR 250ML POUR (IV SOLUTION) ×2 IMPLANT

## 2020-02-10 NOTE — Transfer of Care (Signed)
Immediate Anesthesia Transfer of Care Note  Patient: Toni Kelly  Procedure(s) Performed: COLONOSCOPY WITH BIOPSY (N/A Rectum) POLYPECTOMY (N/A Rectum)  Patient Location: PACU  Anesthesia Type: General  Level of Consciousness: awake, alert  and patient cooperative  Airway and Oxygen Therapy: Patient Spontanous Breathing and Patient connected to supplemental oxygen  Post-op Assessment: Post-op Vital signs reviewed, Patient's Cardiovascular Status Stable, Respiratory Function Stable, Patent Airway and No signs of Nausea or vomiting  Post-op Vital Signs: Reviewed and stable  Complications: No apparent anesthesia complications

## 2020-02-10 NOTE — H&P (Signed)
Jonathon Bellows, MD [address], [place], [place], [place], [address] [address], [place], [place], [place], [address] Phone: [phone]  Fax: [phone]  Primary Care Physician:  Glean Hess, MD   Pre-Procedure History & Physical: HPI:  Toni Kelly is a 59 y.o. female is here for an colonoscopy.   Past Medical History:  Diagnosis Date  . Arthritis    lower back  . Bursitis   . Esophageal reflux   . Hemorrhoids 2011  . HPV (human papilloma virus) infection   . Hyperlipidemia   . Hypertension     Past Surgical History:  Procedure Laterality Date  . ABDOMINAL HYSTERECTOMY  08/2018   abnormal pap  . CERVICAL BIOPSY  W/ LOOP ELECTRODE EXCISION  05/2017   [facility]  . COLONOSCOPY  2011   @ [facility]  . hemorrhoids banding  2011  . TONSILLECTOMY      Prior to Admission medications   Medication Sig Start Date End Date Taking? Authorizing Provider  DICLOFENAC SODIUM ER PO Take by mouth.   Yes [provider]  hydrochlorothiazide (HYDRODIURIL) 25 MG tablet Take 1 tablet (25 mg total) by mouth daily. 12/15/19  Yes Glean Hess, MD  lactulose (CHRONULAC) 10 GM/15ML solution Take 15 mLs (10 g total) by mouth daily. 12/15/19  Yes Glean Hess, MD  Multiple Vitamins-Minerals (WOMENS 50+ [place] VITAMIN/MIN PO) Take 1 tablet by mouth.   Yes [provider]  Omega-3 Fatty Acids (FISH OIL PO) Take by mouth daily.   Yes [provider]  omeprazole (PRILOSEC) 20 MG capsule Take 1 capsule (20 mg total) by mouth 2 (two) times daily before a meal. 12/15/19  Yes Glean Hess, MD  ondansetron (ZOFRAN) 4 MG tablet Take 1 tablet (4 mg total) by mouth every 8 (eight) hours as needed for up to 14 doses for nausea or vomiting. 02/09/20  Yes Vanga, Tally Due, MD  simvastatin (ZOCOR) 20 MG tablet TAKE 1 TABLET BY MOUTH EVERY DAY 11/19/19  Yes Glean Hess, MD  linaclotide Rolan Lipa) 290 MCG CAPS capsule Take 1 capsule (290 mcg total) by  mouth daily before breakfast. Patient not taking: Reported on 02/02/2020 12/15/19   Glean Hess, MD  Plecanatide (TRULANCE) 3 MG TABS Take 3 mg by mouth daily at 2 PM. Patient not taking: Reported on 02/02/2020 06/27/19   Glean Hess, MD  omeprazole (PRILOSEC) 20 MG capsule TAKE 1 CAPSULE (20 MG TOTAL) BY MOUTH 2 (TWO) TIMES DAILY BEFORE A MEAL. 12/05/18   Glean Hess, MD    Allergies as of 12/16/2019 - Review Complete 12/15/2019  Allergen Reaction Noted  . Codeine Itching 07/21/2013  . Oxycodone Itching 10/20/2018  . Ultram [tramadol] Itching 07/21/2013    Family History  Problem Relation Age of Onset  . Breast cancer Maternal Aunt 32  . Breast cancer Cousin 11  . Heart failure Mother   . Lung cancer Maternal Uncle   . Lung cancer Maternal Aunt   . Lung cancer Maternal Uncle     Social History   Socioeconomic History  . Marital status: Married    Spouse name: Not on file  . Number of children: Not on file  . Years of education: Not on file  . Highest education level: Not on file  Occupational History  . Not on file  Tobacco Use  . Smoking status: Never Smoker  . Smokeless tobacco: Never Used  Substance and Sexual Activity  . Alcohol  use: Yes    Alcohol/week: 0.0 standard drinks    Comment: rarely  . Drug use: No  . Sexual activity: Yes    Birth control/protection: Surgical  Other Topics Concern  . Not on file  Social History Narrative  . Not on file   Social Determinants of Health   Financial Resource Strain:   . Difficulty of Paying Living Expenses:   Food Insecurity:   . Worried About Charity fundraiser in the Last Year:   . Arboriculturist in the Last Year:   Transportation Needs:   . Film/video editor (Medical):   Marland Kitchen Lack of Transportation (Non-Medical):   Physical Activity:   . Days of Exercise per Week:   . Minutes of Exercise per Session:   Stress:   . Feeling of Stress :   Social Connections:   . Frequency of Communication  with Friends and Family:   . Frequency of Social Gatherings with Friends and Family:   . Attends Religious Services:   . Active Member of Clubs or Organizations:   . Attends Archivist Meetings:   Marland Kitchen Marital Status:   Intimate Partner Violence:   . Fear of Current or Ex-Partner:   . Emotionally Abused:   Marland Kitchen Physically Abused:   . Sexually Abused:     Review of Systems: See HPI, otherwise negative ROS  Physical Exam: BP (!) 184/83   Pulse 71   Temp 97.8 F (36.6 C) (Temporal)   Resp 16   Ht 5\' 4"  (1.626 m)   Wt 121.6 kg   SpO2 97%   BMI 46.00 kg/m  General:   Alert,  pleasant and cooperative in NAD Head:  Normocephalic and atraumatic. Neck:  Supple; no masses or thyromegaly. Lungs:  Clear throughout to auscultation, normal respiratory effort.    Heart:  +S1, +S2, Regular rate and rhythm, No edema. Abdomen:  Soft, nontender and nondistended. Normal bowel sounds, without guarding, and without rebound.   Neurologic:  Alert and  oriented x4;  grossly normal neurologically.  Impression/Plan: Toni Kelly is here for an colonoscopy to be performed for Screening colonoscopy average risk   Risks, benefits, limitations, and alternatives regarding  colonoscopy have been reviewed with the patient.  Questions have been answered.  All parties agreeable.   Jonathon Bellows, MD  02/10/2020, 10:09 AM

## 2020-02-10 NOTE — Anesthesia Preprocedure Evaluation (Signed)
Anesthesia Evaluation  Patient identified by MRN, date of birth, ID band Patient awake    Reviewed: Allergy & Precautions, H&P , NPO status , Patient's Chart, lab work & pertinent test results  History of Anesthesia Complications Negative for: history of anesthetic complications  Airway Mallampati: III  TM Distance: >3 FB Neck ROM: full    Dental   Pulmonary neg pulmonary ROS,    Pulmonary exam normal        Cardiovascular hypertension, On Medications Normal cardiovascular exam Rhythm:regular Rate:Normal     Neuro/Psych negative neurological ROS     GI/Hepatic Neg liver ROS, Medicated,  Endo/Other  negative endocrine ROS  Renal/GU negative Renal ROS  negative genitourinary   Musculoskeletal   Abdominal   Peds  Hematology negative hematology ROS (+)   Anesthesia Other Findings   Reproductive/Obstetrics negative OB ROS                             Anesthesia Physical Anesthesia Plan  ASA: II  Anesthesia Plan: General   Post-op Pain Management:    Induction:   PONV Risk Score and Plan:   Airway Management Planned:   Additional Equipment:   Intra-op Plan:   Post-operative Plan:   Informed Consent: I have reviewed the patients History and Physical, chart, labs and discussed the procedure including the risks, benefits and alternatives for the proposed anesthesia with the patient or authorized representative who has indicated his/her understanding and acceptance.       Plan Discussed with:   Anesthesia Plan Comments:         Anesthesia Quick Evaluation

## 2020-02-10 NOTE — Anesthesia Procedure Notes (Signed)
Procedure Name: MAC Date/Time: 02/10/2020 10:17 AM Performed by: Georga Bora, CRNA Pre-anesthesia Checklist: Patient identified, Emergency Drugs available, Patient being monitored, Suction available and Timeout performed Patient Re-evaluated:Patient Re-evaluated prior to induction Oxygen Delivery Method: Nasal cannula Preoxygenation: Pre-oxygenation with 100% oxygen

## 2020-02-10 NOTE — Op Note (Signed)
[facility] Patient Name: Toni Kelly Procedure Date: 02/10/2020 10:11 AM MRN: [id number] Account #: [id number] Date of Birth: 01-Feb-1961 Admit Type: Outpatient Age: 59 Room: [facility] OR ROOM 01 Gender: Female Note Status: Finalized Procedure:             Colonoscopy Indications:           Screening for colorectal malignant neoplasm Providers:             Jonathon Bellows MD, MD Referring MD:          Halina Maidens, MD (Referring MD) Medicines:             Monitored Anesthesia Care Complications:         No immediate complications. Procedure:             Pre-Anesthesia Assessment:                        - Prior to the procedure, a History and Physical was                         performed, and patient medications, allergies and                         sensitivities were reviewed. The patient's tolerance                         of previous anesthesia was reviewed.                        - The risks and benefits of the procedure and the                         sedation options and risks were discussed with the                         patient. All questions were answered and informed                         consent was obtained.                        - ASA Grade Assessment: II - A patient with mild                         systemic disease.                        After obtaining informed consent, the colonoscope was                         passed under direct vision. Throughout the procedure,                         the patient's blood pressure, pulse, and oxygen                         saturations were monitored continuously. The was                         introduced through the anus and advanced  to the the                         cecum, identified by the appendiceal orifice. The                         colonoscopy was performed with ease. The patient                         tolerated the procedure well. The quality of the bowel   preparation was good. Findings:      The perianal and digital rectal examinations were normal.      A 4 mm polyp was found in the descending colon. The polyp was sessile.       The polyp was removed with a cold snare. Resection and retrieval were       complete.      The exam was otherwise without abnormality on direct and retroflexion       views. Impression:            - One 4 mm polyp in the descending colon, removed with                         a cold snare. Resected and retrieved.                        - The examination was otherwise normal on direct and                         retroflexion views. Recommendation:        - Discharge patient to home (with escort).                        - Resume previous diet.                        - Continue present medications.                        - Await pathology results.                        - Repeat colonoscopy for surveillance based on                         pathology results. Procedure Code(s):     --- Professional ---                        [phone], Colonoscopy, flexible; with removal of                         tumor(s), polyp(s), or other lesion(s) by snare                         technique Diagnosis Code(s):     --- Professional ---                        K63.5, Polyp of colon                        Z12.11,  Encounter for screening for malignant neoplasm                         of colon CPT copyright 2019 American Medical Association. All rights reserved. The codes documented in this report are preliminary and upon coder review may  be revised to meet current compliance requirements. Jonathon Bellows, MD Jonathon Bellows MD, MD 02/10/2020 10:38:11 AM This report has been signed electronically. Number of Addenda: 0 Note Initiated On: 02/10/2020 10:11 AM Scope Withdrawal Time: 0 hours 12 minutes 20 seconds  Total Procedure Duration: 0 hours 16 minutes 38 seconds  Estimated Blood Loss:  Estimated blood loss: none.      [facility]

## 2020-02-10 NOTE — Anesthesia Postprocedure Evaluation (Signed)
Anesthesia Post Note  Patient: Toni Kelly  Procedure(s) Performed: COLONOSCOPY WITH BIOPSY (N/A Rectum) POLYPECTOMY (N/A Rectum)     Patient location during evaluation: PACU Anesthesia Type: General Level of consciousness: awake and alert Pain management: pain level controlled Vital Signs Assessment: post-procedure vital signs reviewed and stable Respiratory status: spontaneous breathing Cardiovascular status: stable Postop Assessment: no headache Anesthetic complications: no    Jaci Standard, III,  Shelene Krage D

## 2020-02-13 ENCOUNTER — Encounter: Payer: Self-pay | Admitting: *Deleted

## 2020-02-13 ENCOUNTER — Other Ambulatory Visit: Payer: Self-pay | Admitting: Internal Medicine

## 2020-02-13 ENCOUNTER — Encounter: Payer: Self-pay | Admitting: Internal Medicine

## 2020-02-13 ENCOUNTER — Encounter: Payer: Self-pay | Admitting: Gastroenterology

## 2020-02-13 DIAGNOSIS — D126 Benign neoplasm of colon, unspecified: Secondary | ICD-10-CM | POA: Insufficient documentation

## 2020-02-13 LAB — SURGICAL PATHOLOGY

## 2020-02-15 ENCOUNTER — Other Ambulatory Visit: Payer: Self-pay | Admitting: Internal Medicine

## 2020-02-15 DIAGNOSIS — Z1231 Encounter for screening mammogram for malignant neoplasm of breast: Secondary | ICD-10-CM

## 2020-02-28 ENCOUNTER — Other Ambulatory Visit: Payer: Self-pay

## 2020-02-28 ENCOUNTER — Ambulatory Visit: Payer: BC Managed Care – PPO | Attending: Internal Medicine

## 2020-02-28 DIAGNOSIS — Z23 Encounter for immunization: Secondary | ICD-10-CM

## 2020-02-28 NOTE — Progress Notes (Signed)
   Covid-19 Vaccination Clinic  Name:  Toni Kelly    MRN: [id number] DOB: 1961-02-19  02/28/2020  [name] was observed post Covid-19 immunization for 15 minutes without incident. She was provided with Vaccine Information Sheet and instruction to access the V-Safe system.   [name] was instructed to call 911 with any severe reactions post vaccine: Marland Kitchen Difficulty breathing  . Swelling of face and throat  . A fast heartbeat  . A bad rash all over body  . Dizziness and weakness   Immunizations Administered    Name Date Dose VIS Date Route   Pfizer COVID-19 Vaccine 02/28/2020  9:30 AM 0.3 mL 11/04/2019 Intramuscular   Manufacturer: [place]   Lot: [id number]   [place]: [id number]

## 2020-03-13 ENCOUNTER — Other Ambulatory Visit: Payer: Self-pay

## 2020-03-13 ENCOUNTER — Ambulatory Visit (INDEPENDENT_AMBULATORY_CARE_PROVIDER_SITE_OTHER): Payer: BC Managed Care – PPO | Admitting: Gastroenterology

## 2020-03-13 VITALS — BP 168/94 | HR 75 | Temp 98.0°F | Ht 64.0 in | Wt 268.0 lb

## 2020-03-13 DIAGNOSIS — K5909 Other constipation: Secondary | ICD-10-CM

## 2020-03-13 NOTE — Progress Notes (Signed)
Toni Bellows MD, MRCP(U.K) [address]  [place]  [place], [place] [address]  Main: [phone]  Fax: [phone]   Gastroenterology Consultation  Referring Provider:     Glean Hess, MD Primary Care Physician:  Toni Hess, MD Primary Gastroenterologist:  Dr. Jonathon Kelly  Reason for Consultation:     Constipation        HPI:   Toni Kelly is a 59 y.o. y/o female referred for consultation & management  by Dr. Army Kelly, Toni Sans, MD.    She has had constipation for many years and presently takes 30 cc of lactulose once a day and according to her history does not work really well.  She does not have a bowel movement often and probably once every few days.  She had been taking Trulance but her insurance did not cover it adequately and hence was unaffordable.  Same issue with Linzess which did not work as well but also was prohibitively expensive.  No other complaints  02/10/2020:colonoscopy  4 mm polyp resected -adenoma .   Past Medical History:  Diagnosis Date  . Arthritis    lower back  . Bursitis   . Esophageal reflux   . Hemorrhoids 2011  . HPV (human papilloma virus) infection   . Hyperlipidemia   . Hypertension     Past Surgical History:  Procedure Laterality Date  . ABDOMINAL HYSTERECTOMY  08/2018   abnormal pap  . CERVICAL BIOPSY  W/ LOOP ELECTRODE EXCISION  05/2017   [facility]  . COLONOSCOPY  2011   @ [facility]  . COLONOSCOPY WITH PROPOFOL N/A 02/10/2020   Procedure: COLONOSCOPY WITH BIOPSY;  Surgeon: Toni Bellows, MD;  Location: [facility];  Service: Endoscopy;  Laterality: N/A;  priority 4  . hemorrhoids banding  2011  . POLYPECTOMY N/A 02/10/2020   Procedure: POLYPECTOMY;  Surgeon: Toni Bellows, MD;  Location: [facility];  Service: Endoscopy;  Laterality: N/A;  . TONSILLECTOMY      Prior to Admission medications   Medication Sig Start Date End Date Taking? Authorizing Provider  DICLOFENAC SODIUM ER PO Take by  mouth.    [provider]  gabapentin (NEURONTIN) 300 MG capsule  01/02/20   [provider]  hydrochlorothiazide (HYDRODIURIL) 25 MG tablet Take 1 tablet (25 mg total) by mouth daily. 12/15/19   Toni Hess, MD  lactulose (CHRONULAC) 10 GM/15ML solution Take 15 mLs (10 g total) by mouth daily. 12/15/19   Toni Hess, MD  linaclotide Rolan Lipa) 290 MCG CAPS capsule Take 1 capsule (290 mcg total) by mouth daily before breakfast. Patient not taking: Reported on 02/02/2020 12/15/19   Toni Hess, MD  Multiple Vitamins-Minerals (WOMENS 50+ [place] VITAMIN/MIN PO) Take 1 tablet by mouth.    [provider]  Omega-3 Fatty Acids (FISH OIL PO) Take by mouth daily.    [provider]  omeprazole (PRILOSEC) 20 MG capsule Take 1 capsule (20 mg total) by mouth 2 (two) times daily before a meal. 12/15/19   Toni Hess, MD  ondansetron (ZOFRAN) 4 MG tablet Take 1 tablet (4 mg total) by mouth every 8 (eight) hours as needed for up to 14 doses for nausea or vomiting. 02/09/20   Vanga, Toni Due, MD  Plecanatide (TRULANCE) 3 MG TABS Take 3 mg by mouth daily at 2 PM. Patient not taking: Reported on 02/02/2020 06/27/19   Toni Hess, MD  simvastatin (ZOCOR) 20 MG tablet TAKE  1 TABLET BY MOUTH EVERY DAY 11/19/19   Toni Hess, MD  omeprazole (PRILOSEC) 20 MG capsule TAKE 1 CAPSULE (20 MG TOTAL) BY MOUTH 2 (TWO) TIMES DAILY BEFORE A MEAL. 12/05/18   Toni Hess, MD    Family History  Problem Relation Age of Onset  . Breast cancer Maternal Aunt 14  . Breast cancer Cousin 55  . Heart failure Mother   . Lung cancer Maternal Uncle   . Lung cancer Maternal Aunt   . Lung cancer Maternal Uncle      Social History   Tobacco Use  . Smoking status: Never Smoker  . Smokeless tobacco: Never Used  Substance Use Topics  . Alcohol use: Yes    Alcohol/week: 0.0 standard drinks    Comment: rarely  . Drug use: No    Allergies as of 03/13/2020 - Review  Complete 02/10/2020  Allergen Reaction Noted  . Codeine Itching 07/21/2013  . Oxycodone Itching 10/20/2018  . Ultram [tramadol] Itching 07/21/2013    Review of Systems:    All systems reviewed and negative except where noted in HPI.   Physical Exam:  There were no vitals taken for this visit. No LMP recorded. Patient has had a hysterectomy. Psych:  Alert and cooperative. Normal mood and affect. General:   Alert,  Well-developed, well-nourished, pleasant and cooperative in NAD Head:  Normocephalic and atraumatic. Neurologic:  Alert and oriented x3;  grossly normal neurologically. Psych:  Alert and cooperative. Normal mood and affect.  Imaging Studies: No results found.  Assessment and Plan:   Toni Kelly is a 59 y.o. y/o female has been referred for constipation.  Plan  1. High fiber diet - patient information provided 2.  Increase dose of lactulose to 30 cc twice daily and if needed can increase to 3 times daily 3.  Check TSH Follow up in 3 to 4 weeks telephone visit  Dr Toni Bellows MD,MRCP(U.K)

## 2020-03-14 ENCOUNTER — Encounter: Payer: Self-pay | Admitting: Gastroenterology

## 2020-03-14 ENCOUNTER — Other Ambulatory Visit: Payer: Self-pay

## 2020-03-14 DIAGNOSIS — K5904 Chronic idiopathic constipation: Secondary | ICD-10-CM

## 2020-03-14 LAB — TSH: TSH: 0.888 u[IU]/mL (ref 0.450–4.500)

## 2020-03-14 MED ORDER — TRULANCE 3 MG PO TABS: 3.0000 mg | ORAL_TABLET | Freq: Every day | ORAL | 5 refills | Status: DC

## 2020-03-23 ENCOUNTER — Other Ambulatory Visit: Payer: Self-pay | Admitting: Internal Medicine

## 2020-03-23 DIAGNOSIS — K5904 Chronic idiopathic constipation: Secondary | ICD-10-CM

## 2020-03-23 NOTE — Telephone Encounter (Signed)
Requested medication (s) are due for refill today: no  Requested medication (s) are on the active medication list: yes  Last refill:  03/14/2020  Future visit scheduled: yes  Notes to clinic:  Looks like the script was sent on 03/14/2020. Left a vm for patient to contact pharmacy to make sure they received    Requested Prescriptions  Pending Prescriptions Disp Refills   Plecanatide (TRULANCE) 3 MG TABS 30 tablet 5    Sig: Take 3 mg by mouth daily at 2 PM.      Off-Protocol Failed - 03/23/2020  8:43 AM      Failed - Medication not assigned to a protocol, review manually.      Passed - Valid encounter within last 12 months    Recent Outpatient Visits           3 months ago Annual physical exam   [facility] Glean Hess, MD   1 year ago Acute non-recurrent sinusitis, unspecified location   [facility] Glean Hess, MD   1 year ago Gastroesophageal reflux disease, esophagitis presence not specified   [facility] Glean Hess, MD   1 year ago Cough   [facility] Glean Hess, MD   2 years ago [facility] Glean Hess, MD       Future Appointments             In 9 months Army Melia Jesse Sans, MD [facility]

## 2020-03-23 NOTE — Telephone Encounter (Signed)
Medication: Plecanatide (TRULANCE) 3 MG TABS     Patient has switched pharmacy and is requesting this medication be sent with 90 day supply.    Pharmacy:  [place], [place] Phone:  [phone]  Fax:  [phone]

## 2020-03-26 ENCOUNTER — Encounter: Payer: Self-pay | Admitting: Internal Medicine

## 2020-03-26 ENCOUNTER — Other Ambulatory Visit: Payer: Self-pay | Admitting: Internal Medicine

## 2020-03-26 DIAGNOSIS — J019 Acute sinusitis, unspecified: Secondary | ICD-10-CM

## 2020-03-26 MED ORDER — AMOXICILLIN-POT CLAVULANATE 875-125 MG PO TABS: 1.0000 | ORAL_TABLET | Freq: Two times a day (BID) | ORAL | 0 refills | Status: AC

## 2020-03-26 MED ORDER — TRULANCE 3 MG PO TABS: 3.0000 mg | ORAL_TABLET | Freq: Every day | ORAL | 1 refills | Status: DC

## 2020-03-28 ENCOUNTER — Encounter: Payer: Self-pay | Admitting: Internal Medicine

## 2020-03-28 ENCOUNTER — Other Ambulatory Visit: Payer: Self-pay

## 2020-03-28 DIAGNOSIS — K5904 Chronic idiopathic constipation: Secondary | ICD-10-CM

## 2020-03-28 MED ORDER — TRULANCE 3 MG PO TABS: 3.0000 mg | ORAL_TABLET | Freq: Every day | ORAL | 0 refills | Status: DC

## 2020-04-03 ENCOUNTER — Ambulatory Visit: Payer: BC Managed Care – PPO | Admitting: Gastroenterology

## 2020-04-10 ENCOUNTER — Encounter: Payer: Self-pay | Admitting: Internal Medicine

## 2020-04-11 ENCOUNTER — Other Ambulatory Visit: Payer: Self-pay

## 2020-04-11 ENCOUNTER — Ambulatory Visit (INDEPENDENT_AMBULATORY_CARE_PROVIDER_SITE_OTHER): Payer: BC Managed Care – PPO | Admitting: Internal Medicine

## 2020-04-11 ENCOUNTER — Encounter: Payer: Self-pay | Admitting: Internal Medicine

## 2020-04-11 VITALS — BP 124/78 | HR 79 | Temp 98.2°F | Ht 64.0 in | Wt 264.0 lb

## 2020-04-11 DIAGNOSIS — J40 Bronchitis, not specified as acute or chronic: Secondary | ICD-10-CM | POA: Diagnosis not present

## 2020-04-11 MED ORDER — LEVOFLOXACIN 750 MG PO TABS: 750.0000 mg | ORAL_TABLET | Freq: Every day | ORAL | 0 refills | Status: AC

## 2020-04-11 NOTE — Progress Notes (Signed)
Date:  04/11/2020   Name:  Toni Kelly   DOB:  May 29, 1961   MRN:  [id number]   Chief Complaint: Cough (Follow up after amoxicillin abx. Says she is still coughing up green mucous. No fevers. )  Cough This is a recurrent problem. The problem has been unchanged. The problem occurs every few minutes. The cough is productive of sputum. Pertinent negatives include no chest pain, chills, fever, headaches, sore throat, shortness of breath, sweats or wheezing. The symptoms are aggravated by exercise and cold air. Treatments tried: augmentin. The treatment provided no relief (still coughing up green phlegm).    Lab Results  Component Value Date   CREATININE 0.94 12/15/2019   BUN 19 12/15/2019   NA 143 12/15/2019   K 4.7 12/15/2019   CL 104 12/15/2019   CO2 24 12/15/2019   Lab Results  Component Value Date   CHOL 185 12/15/2019   HDL 55 12/15/2019   LDLCALC 101 (H) 12/15/2019   TRIG 166 (H) 12/15/2019   CHOLHDL 3.4 12/15/2019   Lab Results  Component Value Date   TSH 0.888 03/13/2020   Lab Results  Component Value Date   HGBA1C 5.8 (H) 12/15/2019   Lab Results  Component Value Date   WBC 6.2 12/15/2019   HGB 14.2 12/15/2019   HCT 43.1 12/15/2019   MCV 90 12/15/2019   PLT 247 12/15/2019   Lab Results  Component Value Date   ALT 78 (H) 12/15/2019   AST 86 (H) 12/15/2019   ALKPHOS 79 12/15/2019   BILITOT 0.4 12/15/2019     Review of Systems  Constitutional: Negative for chills and fever.  HENT: Negative for sore throat.   Respiratory: Positive for cough. Negative for shortness of breath and wheezing.   Cardiovascular: Negative for chest pain and palpitations.  Neurological: Negative for dizziness and headaches.  Psychiatric/Behavioral: Negative for dysphoric mood and sleep disturbance.    Patient Active Problem List   Diagnosis Date Noted  . Tubular adenoma of colon 02/13/2020  . Morbid obesity with BMI of 40.0-44.9, adult ([place]) 08/10/2019  . Constipation  12/13/2018  . Abnormal EKG 09/14/2018  . DDD (degenerative disc disease), lumbar 08/26/2018  . Gastroesophageal reflux disease 01/14/2018  . HSIL on Pap smear of cervix 05/23/2016  . Localized edema 10/31/2015  . Elbow tendonitis 10/31/2015  . Mixed hyperlipidemia 09/28/2015  . Anal fistula 07/21/2013    Allergies  Allergen Reactions  . Codeine Itching  . Oxycodone Itching  . Ultram [Tramadol] Itching    Past Surgical History:  Procedure Laterality Date  . ABDOMINAL HYSTERECTOMY  08/2018   abnormal pap  . CERVICAL BIOPSY  W/ LOOP ELECTRODE EXCISION  05/2017   [facility]  . COLONOSCOPY  2011   @ [facility]  . COLONOSCOPY WITH PROPOFOL N/A 02/10/2020   Procedure: COLONOSCOPY WITH BIOPSY;  Surgeon: Jonathon Bellows, MD;  Location: [place];  Service: Endoscopy;  Laterality: N/A;  priority 4  . hemorrhoids banding  2011  . POLYPECTOMY N/A 02/10/2020   Procedure: POLYPECTOMY;  Surgeon: Jonathon Bellows, MD;  Location: [place];  Service: Endoscopy;  Laterality: N/A;  . TONSILLECTOMY      Social History   Tobacco Use  . Smoking status: Never Smoker  . Smokeless tobacco: Never Used  Substance Use Topics  . Alcohol use: Yes    Alcohol/week: 0.0 standard drinks    Comment: rarely  . Drug use: No     Medication list has been reviewed and updated.  Current Meds  Medication Sig  . DICLOFENAC SODIUM ER PO Take by mouth.  . gabapentin (NEURONTIN) 300 MG capsule   . hydrochlorothiazide (HYDRODIURIL) 25 MG tablet Take 1 tablet (25 mg total) by mouth daily.  Marland Kitchen lactulose (CHRONULAC) 10 GM/15ML solution Take 15 mLs (10 g total) by mouth daily.  Marland Kitchen linaclotide (LINZESS) 290 MCG CAPS capsule Take 1 capsule (290 mcg total) by mouth daily before breakfast.  . Multiple Vitamins-Minerals (WOMENS 50+ MULTI VITAMIN/MIN PO) Take 1 tablet by mouth.  . Omega-3 Fatty Acids (FISH OIL PO) Take by mouth daily.  Marland Kitchen omeprazole (PRILOSEC) 20 MG capsule Take 1 capsule (20 mg total) by mouth 2  (two) times daily before a meal.  . ondansetron (ZOFRAN) 4 MG tablet Take 1 tablet (4 mg total) by mouth every 8 (eight) hours as needed for up to 14 doses for nausea or vomiting.  Marland Kitchen Plecanatide (TRULANCE) 3 MG TABS Take 3 mg by mouth daily at 2 PM.  . simvastatin (ZOCOR) 20 MG tablet TAKE 1 TABLET BY MOUTH EVERY DAY    PHQ 2/9 Scores 04/11/2020 12/15/2019 12/31/2018 12/13/2018  PHQ - 2 Score 0 0 0 0  PHQ- 9 Score 0 - - -    BP Readings from Last 3 Encounters:  04/11/20 124/78  03/13/20 (!) 168/94  02/10/20 126/81    Physical Exam Vitals and nursing note reviewed.  Constitutional:      General: She is not in acute distress.    Appearance: She is well-developed.  HENT:     Head: Normocephalic and atraumatic.     Nose: Nose normal.  Cardiovascular:     Rate and Rhythm: Normal rate and regular rhythm.     Pulses: Normal pulses.     Heart sounds: No murmur.  Pulmonary:     Effort: Pulmonary effort is normal. No respiratory distress.     Breath sounds: No wheezing or rhonchi.  Musculoskeletal:        General: Normal range of motion.     Cervical back: Normal range of motion.  Lymphadenopathy:     Cervical: No cervical adenopathy.  Skin:    General: Skin is warm and dry.     Findings: No rash.  Neurological:     Mental Status: She is alert and oriented to person, place, and time.  Psychiatric:        Behavior: Behavior normal.        Thought Content: Thought content normal.     Wt Readings from Last 3 Encounters:  04/11/20 264 lb (119.7 kg)  03/13/20 268 lb (121.6 kg)  02/10/20 268 lb (121.6 kg)    BP 124/78   Pulse 79   Temp 98.2 F (36.8 C) (Oral)   Ht 5\' 4"  (1.626 m)   Wt 264 lb (119.7 kg)   SpO2 94%   BMI 45.32 kg/m   Assessment and Plan: 1. Bronchitis Add mucinex bid and change to a broader spectrum abx Follow up if no improvement - levofloxacin (LEVAQUIN) 750 MG tablet; Take 1 tablet (750 mg total) by mouth daily for 5 days.  Dispense: 5 tablet; Refill:  0   Partially dictated using Editor, commissioning. Any errors are unintentional.  Halina Maidens, MD [facility]  04/11/2020

## 2020-04-11 NOTE — Patient Instructions (Signed)
Mucinex plain - take twice a day for a week

## 2020-04-13 ENCOUNTER — Ambulatory Visit: Payer: BC Managed Care – PPO | Admitting: Internal Medicine

## 2020-04-16 ENCOUNTER — Encounter: Payer: Self-pay | Admitting: Internal Medicine

## 2020-05-07 ENCOUNTER — Telehealth: Payer: Self-pay

## 2020-05-07 NOTE — Telephone Encounter (Signed)
Completed PA on [email or web address] for pt Trulance 3mg  tab.   Key: [id number]  Approved today Effective from 05/07/2020 through 05/06/2021.  KP

## 2020-05-20 IMAGING — MG DIGITAL SCREENING BILATERAL MAMMOGRAM WITH TOMO AND CAD
8 series · 8 of 24 positions shown · non-contrast
Comparison: Previous exam(s).

CLINICAL DATA: Screening.

EXAM:
DIGITAL SCREENING BILATERAL MAMMOGRAM WITH TOMO AND CAD

[L MLO synth-2D]
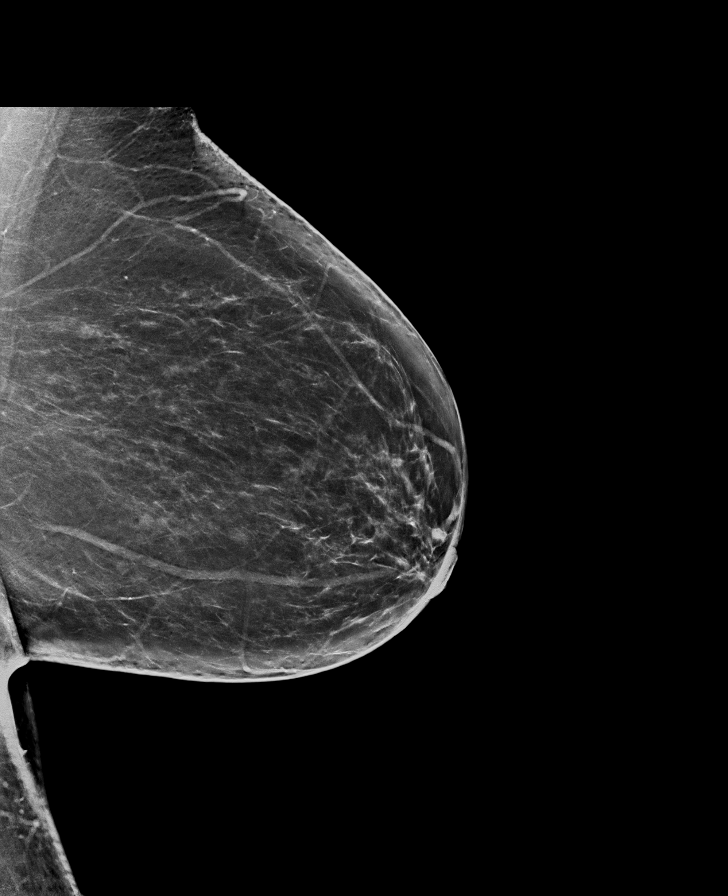

[R MLO synth-2D]
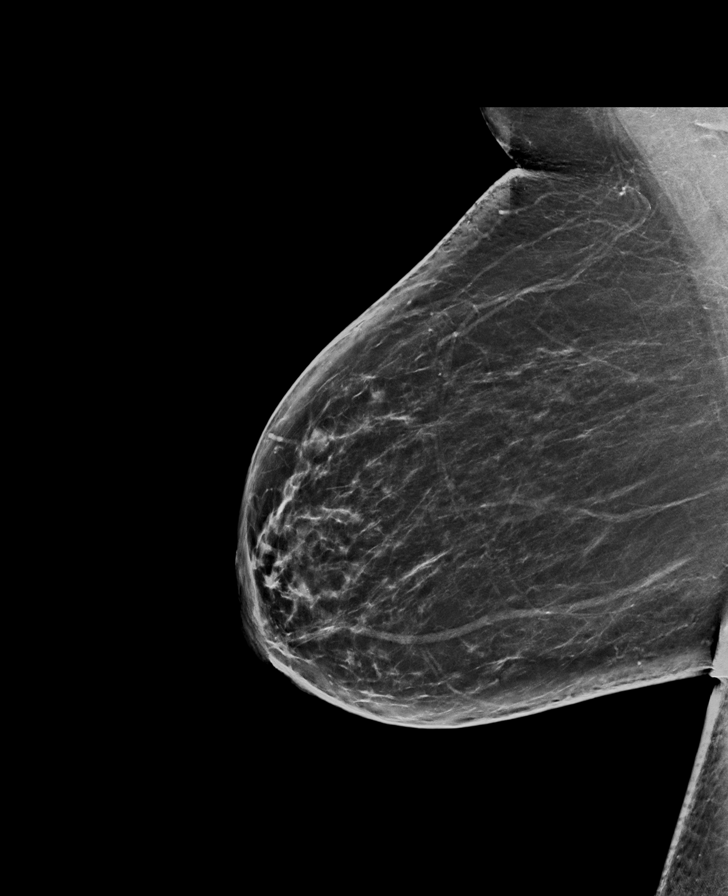

[L CC synth-2D]
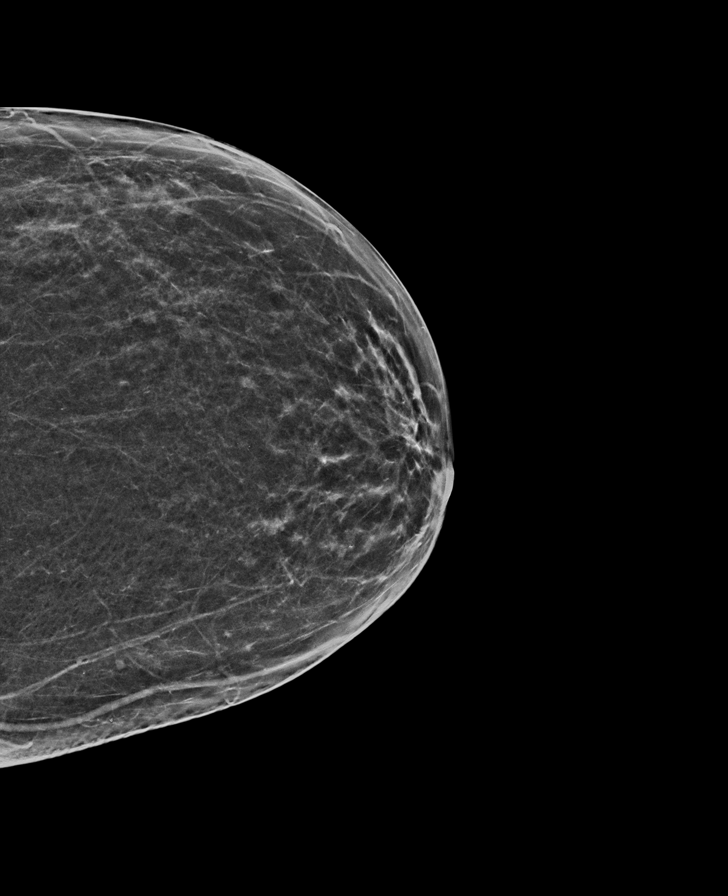

[R CC synth-2D]
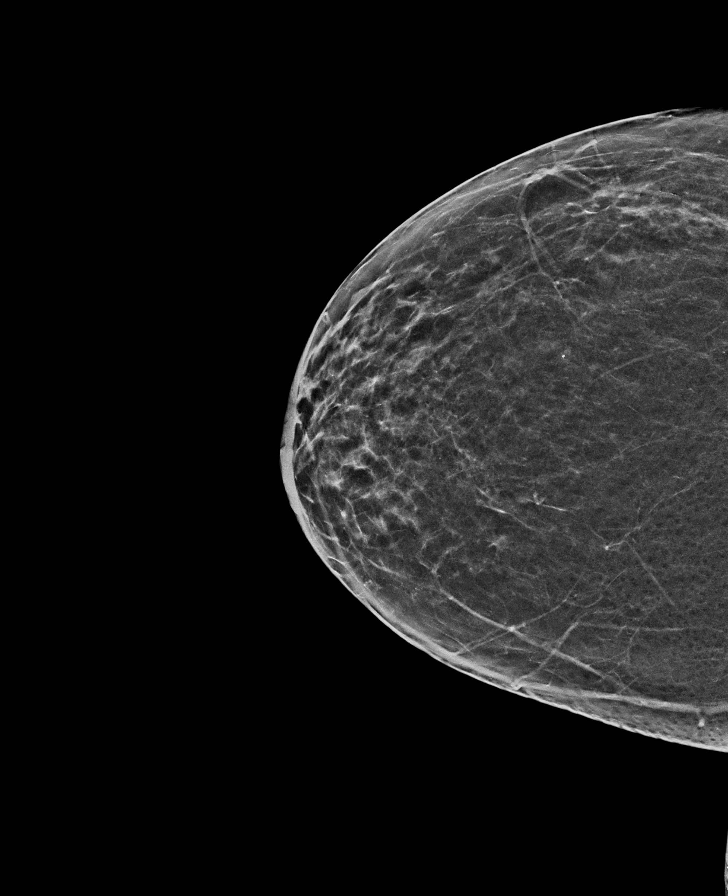

[L MLO tomo · tomo slice 41/80.0]
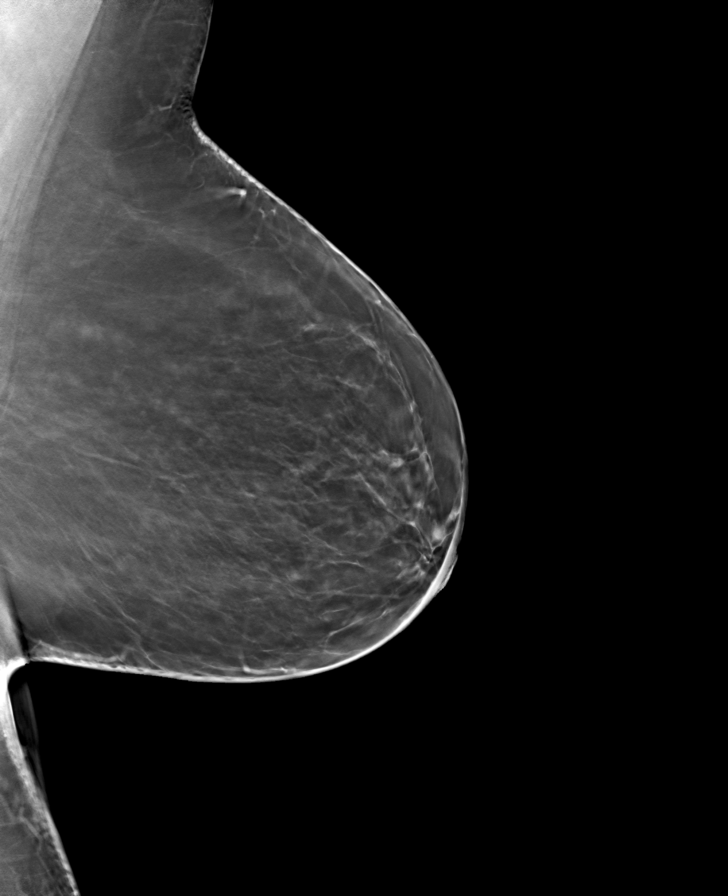

[R MLO tomo · tomo slice 40/79.0]
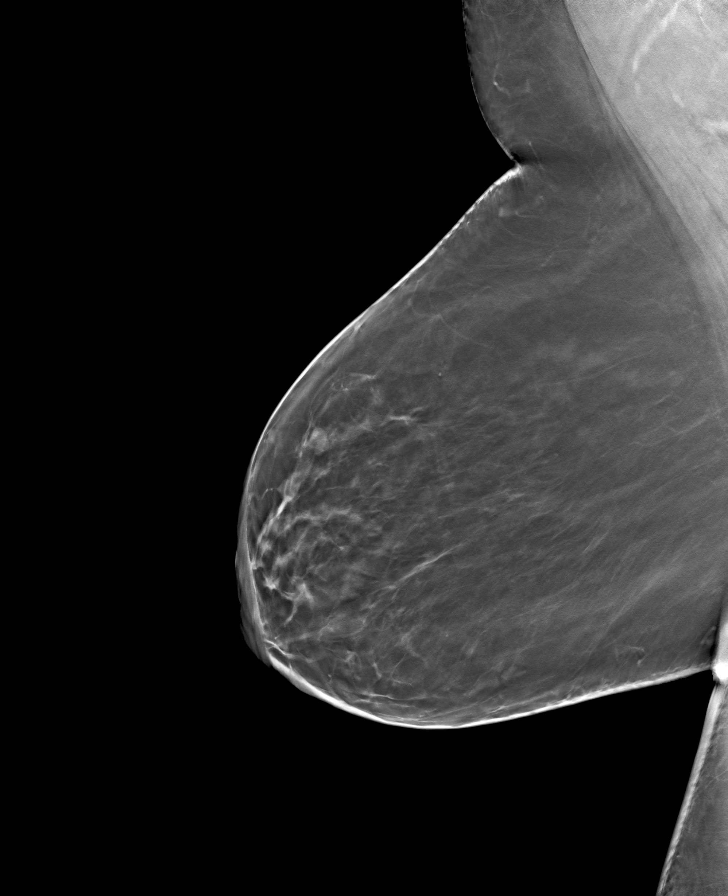

[R CC tomo · tomo slice 29/56.0]
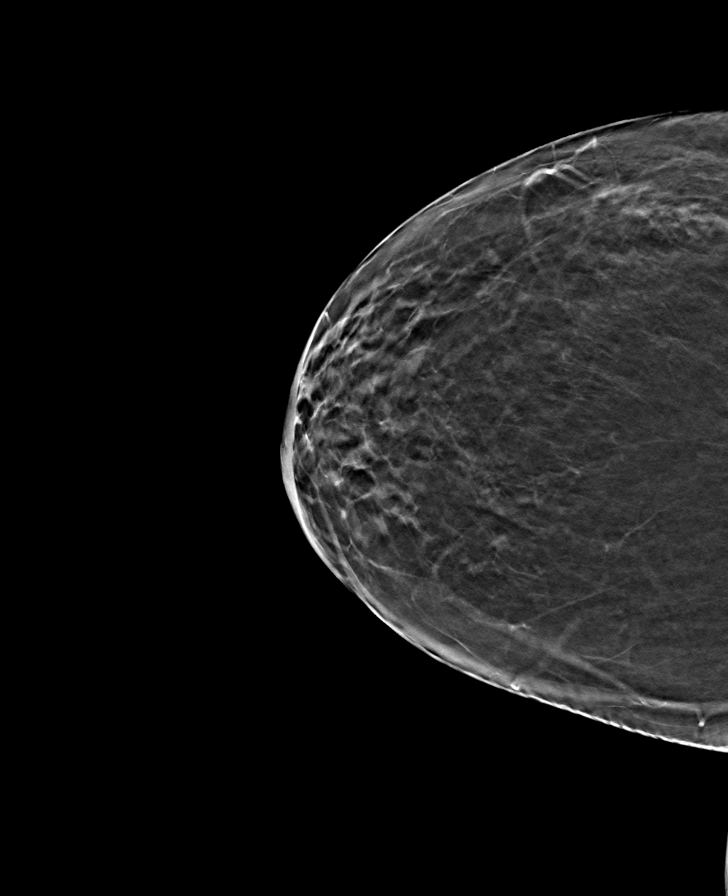

[L CC tomo · tomo slice 30/59.0]
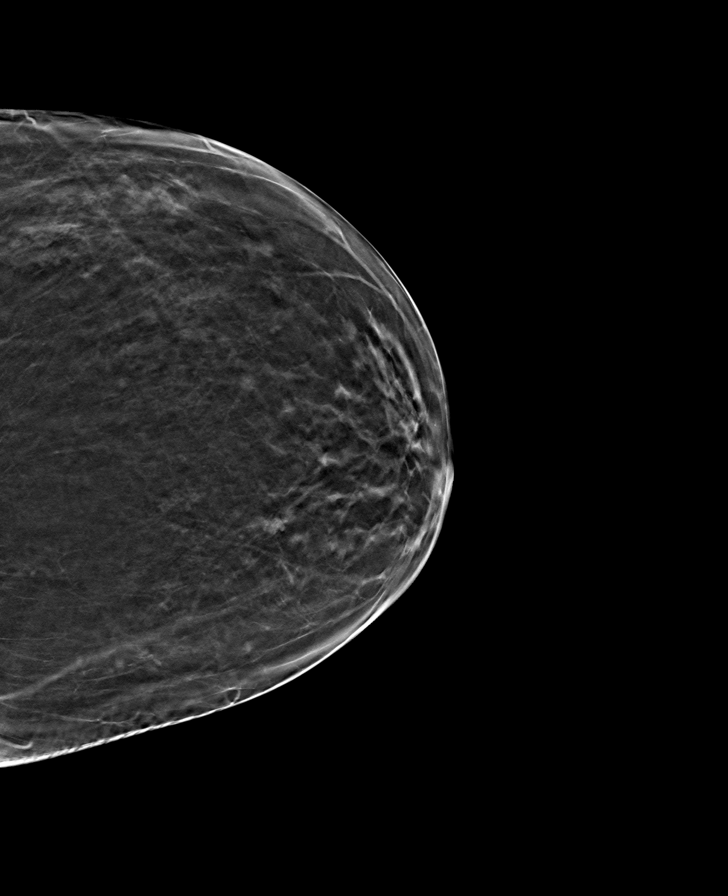

[8 of 24 positions shown; findings below may reference images not displayed]

ACR Breast Density Category b: There are scattered areas of
fibroglandular density.
FINDINGS: There are no findings suspicious for malignancy. Images were
processed with CAD.
IMPRESSION: No mammographic evidence of malignancy. A result letter of this
screening mammogram will be mailed directly to the patient.

RECOMMENDATION:
Screening mammogram in one year. (Code:[id number]-775)

BI-RADS CATEGORY  1: Negative.

## 2020-06-05 NOTE — Progress Notes (Signed)
[facility]  [phone] [phone]  Patient Care Team: Glean Hess, MD as PCP - General (Internal Medicine) Glean Hess, MD as PCP - Internal Medicine (Internal Medicine) Christene Lye, MD (General Surgery) Redmond [employer], MD as Consulting Physician (Internal Medicine) Malachy Mood, MD as Referring Physician (Obstetrics and Gynecology) Clent Jacks, RN as Registered Nurse   Name of the patient: Toni Kelly  [id number]  February 15, 1961   Date of visit: 06/06/2020  Diagnosis: There are no diagnoses linked to this encounter.   Chief Complaint:   Gynecologic Oncology Interval Visit   Referring Provider: Dr. Georgianne Fick  Chief Complaint: HSIL of uncertain origin s/p trachelectomy  Subjective:  Toni Kelly is a 59 y.o. female, initially seen in consultation from Dr. Georgianne Fick for HSIL PAP s/p supracervical hysterectomy, returns to clinic today for continued surveillance.   PAP completed 12/07/2019 ASCUS, HPV negative. Endocervical cells seen on PAP  Today she denies pain, vaginal discharge, or bleeding. She is not sexually active.   She reports constipation as an ongoing problem for her.  -Patient reports prescription medications are too expensive for her. She reports even with a discount card/coupon, these medications are too expensive.  -She reports inadequate relief from colace, miralax, or dulcolax. She reports drinking 60+ ounces of water daily.   Tried Effexor for hot flashes per Dr Georgianne Fick which didn't help. She was prescribed gabapentin, but reports not taking this medication secondary to the side effects.   GYN ONC History Pap results were as follows: 05/23/2016-HSIL HPV positive with negative colposcopy (per Dr. Danielle Rankin note- results not available)  06/15/2017-ASCUS; HR HPV positive.    07/31/2017-LEEP ectocervix.  Pathology revealed chronic cervicitis.  Negative for  dysplasia, viral effect, and malignancy.  Transformation zone was not well-defined.  06/16/18- HSIL, HPV positive.  Negative for HPV 16/18/45 12/01/18 normal and no endocervical cells present.  HPV positive.  06/01/2019 NILM, HPV negative   Per Dr. Georgianne Fick, minimal amount of cervical tissue remaining after prior hysterectomy and LEEP in 2018.  She has history of laparoscopic supracervical hysterectomy w/ Dr. Davis Gourd for painful menstrual periods in her 42s.    Last mammogram: 06/07/2018 BI-RADS Category 1  MRI 07/16/18- to evaluate cervix more closely in view of HSIL PAP Cervix is within normal limits. Specifically, the fibrocervical stroma is intact. No findings suspicious for cervical cancer on MRI, noting that early lesions (CIN or stage IA) may be occult by imaging. Nabothian cysts. 2.3 cm left paraovarian cyst.  No right adnexal mass.  On 09/21/18, she went to [facility] for surgery with Dr. Fransisca Connors. Cervix could not be removed vaginally or laparoscopically, so she had laparotomy with cherney incision, trachelectomy, BSO for cyst, lysis of adhesions. There was a long cervical remnant still present and this was clearly completely removed grossly, but no dysplasia found in cervix on pathology.      PATHOLOGY:  A.  Periumbilical nodule: Dermatofibroma.  B.  Right fallopian tube and ovary: Ovary: Cortical inclusion cyst. Fallopian tube: Paratubal cyst.  C.  Left fallopian tube and ovary: Ovary: No specific pathologic change.  Fallopian tube: Paratubal cysts (up to 2.9 cm).  D.  Cervix: Squamous metaplasia. Leiomyoma (0.3 cm). There is no evidence of malignancy. (Levels examined.)  Problem List: Patient Active Problem List   Diagnosis Date Noted  . Tubular adenoma of colon 02/13/2020  . Morbid obesity with BMI of 40.0-44.9, adult ([place]) 08/10/2019  . Constipation 12/13/2018  . Abnormal EKG  09/14/2018  . DDD (degenerative disc disease), lumbar 08/26/2018  . Gastroesophageal reflux  disease 01/14/2018  . HSIL on Pap smear of cervix 05/23/2016  . Localized edema 10/31/2015  . Elbow tendonitis 10/31/2015  . Mixed hyperlipidemia 09/28/2015  . Anal fistula 07/21/2013    Past Medical History: Past Medical History:  Diagnosis Date  . Arthritis    lower back  . Bursitis   . Esophageal reflux   . Hemorrhoids 2011  . HPV (human papilloma virus) infection   . Hyperlipidemia   . Hypertension     Past Surgical History: Past Surgical History:  Procedure Laterality Date  . ABDOMINAL HYSTERECTOMY  08/2018   abnormal pap  . CERVICAL BIOPSY  W/ LOOP ELECTRODE EXCISION  05/2017   [facility]  . COLONOSCOPY  2011   @ [facility]  . COLONOSCOPY WITH PROPOFOL N/A 02/10/2020   Procedure: COLONOSCOPY WITH BIOPSY;  Surgeon: Jonathon Bellows, MD;  Location: [place];  Service: Endoscopy;  Laterality: N/A;  priority 4  . hemorrhoids banding  2011  . POLYPECTOMY N/A 02/10/2020   Procedure: POLYPECTOMY;  Surgeon: Jonathon Bellows, MD;  Location: [place];  Service: Endoscopy;  Laterality: N/A;  . TONSILLECTOMY      Past Gynecologic History:  G2P2 History of abnormal Pap smears: Yes Last Pap: Per HPI Contraception: Post partial hysterectomy @ ~ age 67 Sexually active: Yes History of STDs: denies  OB History:  OB History  Gravida Para Term Preterm AB Living  2 2       2   SAB TAB Ectopic Multiple Live Births               # Outcome Date GA Lbr Len/2nd Weight Sex Delivery Anes PTL Lv  2 Para           1 Para             Obstetric Comments  1st Menstrual Cycle:  15  1st Pregnancy:  83    Family History: Family History  Problem Relation Age of Onset  . Breast cancer Maternal Aunt 65  . Breast cancer Cousin 71  . Heart failure Mother   . Lung cancer Maternal Uncle   . Lung cancer Maternal Aunt   . Lung cancer Maternal Uncle     Social History: Social History   Socioeconomic History  . Marital status: Married    Spouse name: Not on file  . Number of  children: Not on file  . Years of education: Not on file  . Highest education level: Not on file  Occupational History  . Not on file  Tobacco Use  . Smoking status: Never Smoker  . Smokeless tobacco: Never Used  Vaping Use  . Vaping Use: Never used  Substance and Sexual Activity  . Alcohol use: Yes    Alcohol/week: 0.0 standard drinks    Comment: rarely  . Drug use: No  . Sexual activity: Yes    Birth control/protection: Surgical  Other Topics Concern  . Not on file  Social History Narrative  . Not on file   Social Determinants of Health   Financial Resource Strain:   . Difficulty of Paying Living Expenses:   Food Insecurity:   . Worried About Charity fundraiser in the Last Year:   . Arboriculturist in the Last Year:   Transportation Needs:   . Film/video editor (Medical):   Marland Kitchen Lack of Transportation (Non-Medical):   Physical Activity:   . Days  of Exercise per Week:   . Minutes of Exercise per Session:   Stress:   . Feeling of Stress :   Social Connections:   . Frequency of Communication with Friends and Family:   . Frequency of Social Gatherings with Friends and Family:   . Attends Religious Services:   . Active Member of Clubs or Organizations:   . Attends Archivist Meetings:   Marland Kitchen Marital Status:   Intimate Partner Violence:   . Fear of Current or Ex-Partner:   . Emotionally Abused:   Marland Kitchen Physically Abused:   . Sexually Abused:    Allergies: Allergies  Allergen Reactions  . Codeine Itching  . Oxycodone Itching  . Ultram [Tramadol] Itching   Current Medications: Current Outpatient Medications  Medication Sig Dispense Refill  . DICLOFENAC SODIUM ER PO Take 5 mg by mouth in the morning and at bedtime.     . hydrochlorothiazide (HYDRODIURIL) 25 MG tablet Take 1 tablet (25 mg total) by mouth daily. 90 tablet 3  . Multiple Vitamins-Minerals (WOMENS 50+ MULTI VITAMIN/MIN PO) Take 1 tablet by mouth.    . Omega-3 Fatty Acids (FISH OIL PO) Take 1  capsule by mouth daily.     Marland Kitchen omeprazole (PRILOSEC) 20 MG capsule Take 1 capsule (20 mg total) by mouth 2 (two) times daily before a meal. 180 capsule 3  . simvastatin (ZOCOR) 20 MG tablet TAKE 1 TABLET BY MOUTH EVERY DAY 90 tablet 3  . lactulose (CHRONULAC) 10 GM/15ML solution Take 15 mLs (10 g total) by mouth daily. (Patient not taking: Reported on 06/06/2020) 1892 mL 1   No current facility-administered medications for this visit.   Review of Systems General: no complaints  HEENT: no complaints  Lungs: no complaints  Cardiac: no complaints  GI: no complaints  GU: no complaints  Musculoskeletal: no complaints  Extremities: no complaints  Skin: no complaints  Neuro: no complaints  Endocrine: no complaints  Psych: no complaints          Objective:  Physical Examination:  BP (!) 123/98   Pulse 76   Temp 98 F (36.7 C) (Oral)   Resp 16   Ht 5\' 4"  (1.626 m)   Wt 121.1 kg   BMI 45.83 kg/m   ECOG Performance Status: 0 - Asymptomatic  EXAM Physical Exam Constitutional:      Appearance: Normal appearance. She is obese.  HENT:     Head: Normocephalic.     Nose: Nose normal.     Mouth/Throat:     Mouth: Mucous membranes are moist.     Pharynx: Oropharynx is clear.  Eyes:     Conjunctiva/sclera: Conjunctivae normal.  Neck:     Thyroid: No thyroid mass.     Trachea: Trachea normal.  Cardiovascular:     Rate and Rhythm: Normal rate and regular rhythm.     Pulses: Normal pulses.     Heart sounds: Normal heart sounds.  Pulmonary:     Effort: Pulmonary effort is normal.     Breath sounds: Normal breath sounds.  Abdominal:     General: Bowel sounds are normal.     Palpations: Abdomen is soft.  Genitourinary:    General: Normal vulva.     Rectum: Normal.  Lymphadenopathy:     Cervical:     Right cervical: No superficial cervical adenopathy.    Left cervical: No superficial cervical adenopathy.     Upper Body:     Right upper body: No supraclavicular or axillary  adenopathy.     Left upper body: No supraclavicular or axillary adenopathy.     Lower Body: No right inguinal adenopathy. No left inguinal adenopathy.  Skin:    General: Skin is warm and dry.     Capillary Refill: Capillary refill takes 2 to 3 seconds.  Neurological:     General: No focal deficit present.     Mental Status: She is alert.     Cranial Nerves: Cranial nerves are intact.     Sensory: Sensation is intact.     Motor: Motor function is intact.  Psychiatric:        Attention and Perception: Attention normal.        Mood and Affect: Mood and affect normal.        Speech: Speech normal.        Behavior: Behavior normal. Behavior is cooperative.        Thought Content: Thought content normal.        Cognition and Memory: Cognition and memory normal.        Judgment: Judgment normal.    Pelvic: Exam Chaperoned by NP EGBUS: no lesions Vagina: There is still a fibrous circumferential band near the top of the vagina and the top of the vagina tapers above that for about 1-2 cm. No lesions seen, but not able to see the top of the vagina well and some spotting with PAP. Silver nitrate applied.  Bimanual: no masses or tenderness.  Top of vagina intact. Can feel this area well and it is smooth. Rectovaginal: deferred    Assessment:  LISANDRA MATHISEN is a 59 y.o. P2 female diagnosed with persistent HSIL PAP, HR HPV s/p negative LEEP 9/18 and multiple colposcopies. PAP 7/19 HSIL/HPV negative.  She had prior supracervical hysterectomy in remote past for menorrhagia with preservation of ovaries.  Unclear where the HSIL cells were arising and MRI showed normal cervix.  Long residual cervix removed completely 09/21/18 via Cheney incision at [facility].  Upper vaginal tissue looked normal after cervix amputated.  Pathology did not show any evidence of HSIL. PAP 12/01/18 normal and no endocervical cells present.  HPV positive.  Persistent circumferential band in upper vagina, and vagina above this feels  normal.  Pap 06/01/2019 NILM, HPV negative.  PAP 1/21 ASCUS with endocervical component present.  No bleeding or discharge.     Small left paraovarian cyst associated with pain and removed with BSO and benign pathology.  Hot flashes- resolved.   Plan:   Problem List Items Addressed This Visit      Other   HSIL on Pap smear of cervix - Primary   Relevant Orders   IGP, Aptima HPV   Constipation     The cervix was clearly removed completely and it was presumed that this was where the abnormal cells on PAP orignated, but no HSIL was found on pathology.  PAP 1/20 normal, but HPV positive.  In 1/21 PAP ASCUS, no lesions seen.  There is a fibrous circumferential band near top of the vagina and the top of the vagina tapers a bit. No lesions seen, but not able to see the top of the vagina well. However, I can palpate this and it feels normal. Some spotting with PAP and so I applied silver nitrate,  This area may be a bit friable due to presence of residual glandular epithelium, as noted on last PAP.   There could have just been a small focus of  HSIL in the cervix that was  not seen on path, but it is possible that the abnormal cells are coming from the vagina.  No lesion has been seen in the vagina, although hard to see the apex due to the vaginal scar tissue.  Will contact her with results of PAP/HPV from today.  If more abnormal will consider referral to Cheryl Flash in [facility] for evaluation and possible upper vaginectomy.    If PAP normal RTC in 6 months for repeat PAP/HPV.   The patient's diagnosis, an outline of the further diagnostic and laboratory studies which will be required, the recommendation for surgery, and alternatives were discussed with her.  All questions were answered to their satisfaction.  Constipation -Recommended increase water intake, and high fiber diet, as well as trying OTC metamucil, and a probiotic. Patient informed that these medications are available over the  counter.  -Patient reports understanding of this teaching, as well as attempt at these medications for relief of her constipation.   I personally interviewed and examined the patient. Agreed with the above/below plan of care. Patient/family questions were answered.  Mellody Drown, MD  CC:  Glean Hess, [address],  [place] [phone]

## 2020-06-06 ENCOUNTER — Other Ambulatory Visit: Payer: Self-pay

## 2020-06-06 ENCOUNTER — Inpatient Hospital Stay: Payer: BC Managed Care – PPO | Attending: Obstetrics and Gynecology | Admitting: Obstetrics and Gynecology

## 2020-06-06 ENCOUNTER — Ambulatory Visit: Payer: BC Managed Care – PPO

## 2020-06-06 ENCOUNTER — Encounter: Payer: Self-pay | Admitting: Obstetrics and Gynecology

## 2020-06-06 VITALS — BP 123/98 | HR 76 | Temp 98.0°F | Resp 16 | Ht 64.0 in | Wt 267.0 lb

## 2020-06-06 DIAGNOSIS — M5136 Other intervertebral disc degeneration, lumbar region: Secondary | ICD-10-CM | POA: Diagnosis not present

## 2020-06-06 DIAGNOSIS — Z79899 Other long term (current) drug therapy: Secondary | ICD-10-CM | POA: Diagnosis not present

## 2020-06-06 DIAGNOSIS — R9431 Abnormal electrocardiogram [ECG] [EKG]: Secondary | ICD-10-CM | POA: Diagnosis not present

## 2020-06-06 DIAGNOSIS — Z90710 Acquired absence of both cervix and uterus: Secondary | ICD-10-CM | POA: Insufficient documentation

## 2020-06-06 DIAGNOSIS — K5900 Constipation, unspecified: Secondary | ICD-10-CM | POA: Diagnosis not present

## 2020-06-06 DIAGNOSIS — E782 Mixed hyperlipidemia: Secondary | ICD-10-CM | POA: Diagnosis not present

## 2020-06-06 DIAGNOSIS — R87613 High grade squamous intraepithelial lesion on cytologic smear of cervix (HGSIL): Secondary | ICD-10-CM

## 2020-06-06 DIAGNOSIS — Z6841 Body Mass Index (BMI) 40.0 and over, adult: Secondary | ICD-10-CM | POA: Diagnosis not present

## 2020-06-06 DIAGNOSIS — K5909 Other constipation: Secondary | ICD-10-CM

## 2020-06-06 NOTE — Progress Notes (Signed)
Pt has no gyn concerns. She does have chronic constipation but the meds she was on was too expensive for her to stay on. Eating good.

## 2020-06-11 ENCOUNTER — Telehealth: Payer: Self-pay | Admitting: *Deleted

## 2020-06-11 NOTE — Telephone Encounter (Signed)
Results are not back at this time. They are taking 7-10 days and will be released on my chart upon receipt. Voicemail left with Ms. Zylstra with this information.

## 2020-06-11 NOTE — Telephone Encounter (Signed)
Patient called asking for results of PAP test. I do not see a recent one in her chart. There is is noted in her chart /from/  Visit that one was done. Please advise

## 2020-06-12 ENCOUNTER — Ambulatory Visit
Admission: RE | Admit: 2020-06-12 | Discharge: 2020-06-12 | Disposition: A | Discharge status: Home / Self Care | Payer: BC Managed Care – PPO | Source: Ambulatory Visit | Attending: Internal Medicine | Admitting: Internal Medicine

## 2020-06-12 ENCOUNTER — Other Ambulatory Visit: Payer: Self-pay

## 2020-06-12 DIAGNOSIS — Z1231 Encounter for screening mammogram for malignant neoplasm of breast: Secondary | ICD-10-CM | POA: Diagnosis not present

## 2020-06-12 LAB — IGP, APTIMA HPV: HPV Aptima: NEGATIVE

## 2020-06-13 ENCOUNTER — Telehealth: Payer: Self-pay | Admitting: *Deleted

## 2020-06-13 NOTE — Telephone Encounter (Signed)
Patient called reporting that she has seen her PAP results and is asking when she will hear back form Dr Fransisca Connors regarding referring her to a colleague at [facility]. Please return her call [phone]

## 2020-06-13 NOTE — Telephone Encounter (Signed)
Called and discussed with Ms. Toni Kelly. Awaiting final decisions from Dr. Fransisca Connors. He has reviewed pap results.

## 2020-06-14 DIAGNOSIS — M25551 Pain in right hip: Secondary | ICD-10-CM | POA: Diagnosis not present

## 2020-06-14 DIAGNOSIS — M7062 Trochanteric bursitis, left hip: Secondary | ICD-10-CM | POA: Diagnosis not present

## 2020-06-14 DIAGNOSIS — M7061 Trochanteric bursitis, right hip: Secondary | ICD-10-CM | POA: Diagnosis not present

## 2020-06-14 DIAGNOSIS — M25552 Pain in left hip: Secondary | ICD-10-CM | POA: Diagnosis not present

## 2020-06-21 ENCOUNTER — Telehealth: Payer: Self-pay

## 2020-06-21 ENCOUNTER — Other Ambulatory Visit: Payer: Self-pay

## 2020-06-21 DIAGNOSIS — R87613 High grade squamous intraepithelial lesion on cytologic smear of cervix (HGSIL): Secondary | ICD-10-CM

## 2020-06-21 NOTE — Telephone Encounter (Signed)
Referral sent to urogynecology.

## 2020-07-11 ENCOUNTER — Encounter: Payer: Self-pay | Admitting: Internal Medicine

## 2020-07-23 DIAGNOSIS — H40053 Ocular hypertension, bilateral: Secondary | ICD-10-CM | POA: Diagnosis not present

## 2020-08-16 DIAGNOSIS — N893 Dysplasia of vagina, unspecified: Secondary | ICD-10-CM | POA: Diagnosis not present

## 2020-08-16 DIAGNOSIS — N895 Stricture and atresia of vagina: Secondary | ICD-10-CM | POA: Diagnosis not present

## 2020-09-24 HISTORY — PX: VAGINECTOMY, PARTIAL: SHX6846

## 2020-10-03 DIAGNOSIS — N895 Stricture and atresia of vagina: Secondary | ICD-10-CM | POA: Diagnosis not present

## 2020-10-03 DIAGNOSIS — N893 Dysplasia of vagina, unspecified: Secondary | ICD-10-CM | POA: Diagnosis not present

## 2020-10-03 DIAGNOSIS — Z01812 Encounter for preprocedural laboratory examination: Secondary | ICD-10-CM | POA: Diagnosis not present

## 2020-10-03 DIAGNOSIS — Z20822 Contact with and (suspected) exposure to covid-19: Secondary | ICD-10-CM | POA: Diagnosis not present

## 2020-10-03 DIAGNOSIS — N813 Complete uterovaginal prolapse: Secondary | ICD-10-CM | POA: Diagnosis not present

## 2020-10-05 DIAGNOSIS — N7689 Other specified inflammation of vagina and vulva: Secondary | ICD-10-CM | POA: Diagnosis not present

## 2020-10-05 DIAGNOSIS — I10 Essential (primary) hypertension: Secondary | ICD-10-CM | POA: Diagnosis not present

## 2020-10-05 DIAGNOSIS — N992 Postprocedural adhesions of vagina: Secondary | ICD-10-CM | POA: Diagnosis not present

## 2020-10-05 DIAGNOSIS — Z79899 Other long term (current) drug therapy: Secondary | ICD-10-CM | POA: Diagnosis not present

## 2020-10-05 DIAGNOSIS — N893 Dysplasia of vagina, unspecified: Secondary | ICD-10-CM | POA: Diagnosis not present

## 2020-10-05 DIAGNOSIS — Z20822 Contact with and (suspected) exposure to covid-19: Secondary | ICD-10-CM | POA: Diagnosis not present

## 2020-10-05 DIAGNOSIS — Z6841 Body Mass Index (BMI) 40.0 and over, adult: Secondary | ICD-10-CM | POA: Diagnosis not present

## 2020-10-05 DIAGNOSIS — E669 Obesity, unspecified: Secondary | ICD-10-CM | POA: Diagnosis not present

## 2020-10-05 DIAGNOSIS — N898 Other specified noninflammatory disorders of vagina: Secondary | ICD-10-CM | POA: Diagnosis not present

## 2020-10-05 DIAGNOSIS — N895 Stricture and atresia of vagina: Secondary | ICD-10-CM | POA: Diagnosis not present

## 2020-10-05 DIAGNOSIS — K219 Gastro-esophageal reflux disease without esophagitis: Secondary | ICD-10-CM | POA: Diagnosis not present

## 2020-10-05 DIAGNOSIS — N765 Ulceration of vagina: Secondary | ICD-10-CM | POA: Diagnosis not present

## 2020-10-23 ENCOUNTER — Other Ambulatory Visit: Payer: Self-pay | Admitting: Internal Medicine

## 2020-10-23 DIAGNOSIS — E782 Mixed hyperlipidemia: Secondary | ICD-10-CM

## 2020-11-12 ENCOUNTER — Other Ambulatory Visit: Payer: Self-pay | Admitting: Internal Medicine

## 2020-11-12 DIAGNOSIS — R600 Localized edema: Secondary | ICD-10-CM

## 2020-11-12 NOTE — Telephone Encounter (Signed)
Requested Prescriptions  Pending Prescriptions Disp Refills  . hydrochlorothiazide (HYDRODIURIL) 25 MG tablet [Pharmacy Med Name: HYDROCHLOROTHIAZIDE TABS 25MG ] 90 tablet 0    Sig: TAKE 1 TABLET BY MOUTH DAILY .     Cardiovascular: Diuretics - Thiazide Failed - 11/12/2020 12:47 AM      Failed - Last BP in normal range    BP Readings from Last 1 Encounters:  06/06/20 (!) 123/98         Failed - Valid encounter within last 6 months    Recent Outpatient Visits          7 months ago [facility] Glean Hess, MD   11 months ago Annual physical exam   [facility] Glean Hess, MD   1 year ago Acute non-recurrent sinusitis, unspecified location   [facility] Glean Hess, MD   1 year ago Gastroesophageal reflux disease, esophagitis presence not specified   [facility] Glean Hess, MD   2 years ago Cough   [facility] Glean Hess, MD      Future Appointments            In 2 months Glean Hess, MD [facility], [place] in normal range and within 360 days    Calcium  Date Value Ref Range Status  12/15/2019 9.4 8.7 - 10.2 mg/dL Final         Passed - Cr in normal range and within 360 days    Creatinine, Ser  Date Value Ref Range Status  12/15/2019 0.94 0.57 - 1.00 mg/dL Final         Passed - K in normal range and within 360 days    Potassium  Date Value Ref Range Status  12/15/2019 4.7 3.5 - 5.2 mmol/L Final         Passed - Na in normal range and within 360 days    Sodium  Date Value Ref Range Status  12/15/2019 143 134 - 144 mmol/L Final          Annual physical was changed from January to February, due to Provider sched. Change.  Refilled #90/ 0 refills at this time.

## 2020-12-03 DIAGNOSIS — N893 Dysplasia of vagina, unspecified: Secondary | ICD-10-CM | POA: Insufficient documentation

## 2020-12-05 ENCOUNTER — Ambulatory Visit: Payer: BC Managed Care – PPO

## 2020-12-12 ENCOUNTER — Ambulatory Visit: Payer: BC Managed Care – PPO

## 2020-12-19 ENCOUNTER — Inpatient Hospital Stay: Payer: BC Managed Care – PPO | Attending: Obstetrics and Gynecology | Admitting: Obstetrics and Gynecology

## 2020-12-19 ENCOUNTER — Other Ambulatory Visit: Payer: Self-pay

## 2020-12-19 ENCOUNTER — Encounter: Payer: BC Managed Care – PPO | Admitting: Internal Medicine

## 2020-12-19 ENCOUNTER — Inpatient Hospital Stay: Payer: BC Managed Care – PPO

## 2020-12-19 VITALS — BP 163/84 | HR 72 | Temp 98.7°F | Resp 20 | Wt 269.5 lb

## 2020-12-19 DIAGNOSIS — Z90722 Acquired absence of ovaries, bilateral: Secondary | ICD-10-CM | POA: Diagnosis not present

## 2020-12-19 DIAGNOSIS — A630 Anogenital (venereal) warts: Secondary | ICD-10-CM | POA: Diagnosis not present

## 2020-12-19 DIAGNOSIS — Z79899 Other long term (current) drug therapy: Secondary | ICD-10-CM | POA: Insufficient documentation

## 2020-12-19 DIAGNOSIS — M5136 Other intervertebral disc degeneration, lumbar region: Secondary | ICD-10-CM | POA: Insufficient documentation

## 2020-12-19 DIAGNOSIS — K219 Gastro-esophageal reflux disease without esophagitis: Secondary | ICD-10-CM | POA: Insufficient documentation

## 2020-12-19 DIAGNOSIS — Z6841 Body Mass Index (BMI) 40.0 and over, adult: Secondary | ICD-10-CM | POA: Diagnosis not present

## 2020-12-19 DIAGNOSIS — E782 Mixed hyperlipidemia: Secondary | ICD-10-CM | POA: Diagnosis not present

## 2020-12-19 DIAGNOSIS — R87613 High grade squamous intraepithelial lesion on cytologic smear of cervix (HGSIL): Secondary | ICD-10-CM | POA: Diagnosis not present

## 2020-12-19 DIAGNOSIS — I10 Essential (primary) hypertension: Secondary | ICD-10-CM | POA: Diagnosis not present

## 2020-12-19 DIAGNOSIS — Z90710 Acquired absence of both cervix and uterus: Secondary | ICD-10-CM | POA: Insufficient documentation

## 2020-12-19 NOTE — Progress Notes (Signed)
[place]  [phone] [phone]  Patient Care Team: Glean Hess, MD as PCP - General (Internal Medicine) Glean Hess, MD as PCP - Internal Medicine (Internal Medicine) Christene Lye, MD (General Surgery) [employer], MD as Consulting Physician (Internal Medicine) Malachy Mood, MD as Referring Physician (Obstetrics and Gynecology) Clent Jacks, RN as Registered Nurse   Name of the patient: Toni Kelly  [id number]  [birth year]   Date of visit: 12/19/2020  Diagnosis: There are no diagnoses linked to this encounter.   Chief Complaint:   Gynecologic Oncology Interval Visit   Referring Provider: Dr. Georgianne Fick  Chief Complaint: HSIL of uncertain origin s/p trachelectomy  Subjective:  Toni Kelly is a 60 y.o. female, initially seen in consultation from Dr. Georgianne Fick for HSIL PAP s/p supracervical hysterectomy, returns to clinic today for continued surveillance.   On 10/05/20 she underwent upper vaginectomy with Dr Sharlett Iles at [facility] for persistent abnormal PAP and endocervical cells on PAP post trachelectomy and fibrous band in upper vagina.  Pathology on upper vagina was negative for dysplasia or cancer.  Fragments of vagina with ulceration, fibrosis, and chronic inflammation.  Negative for dysplasia or carcinoma.  Here today for repeat exam and PAP. No complaints with good healing after surgery.  Today she denies pain, vaginal discharge, or bleeding. She is not sexually active.   GYN ONC History She has history of laparoscopic supracervical hysterectomy w/ Dr. Davis Gourd for painful menstrual periods in her 40s.    Pap results were as follows: 05/23/2016-HSIL HPV positive with negative colposcopy (per Dr. Danielle Rankin note- results not available)  06/15/2017-ASCUS; HR HPV positive.    07/31/2017-LEEP ectocervix.  Pathology revealed chronic cervicitis.  Negative for dysplasia, viral  effect, and malignancy.  Transformation zone was not well-defined.  06/16/18- HSIL, HPV positive.  Negative for HPV 16/18/45 12/01/18 normal and no endocervical cells present.  HPV positive.  06/01/2019 NILM, HPV negative   Per Dr. Georgianne Fick, minimal amount of cervical tissue remaining after prior hysterectomy and LEEP in 2018.  MRI 07/16/18- to evaluate cervix more closely in view of HSIL PAP Cervix is within normal limits. Specifically, the fibrocervical stroma is intact. No findings suspicious for cervical cancer on MRI, noting that early lesions (CIN or stage IA) may be occult by imaging. Nabothian cysts. 2.3 cm left paraovarian cyst.  No right adnexal mass.  On 09/21/18, she went to [facility] for surgery with Dr. Fransisca Connors. Cervix could not be removed vaginally or laparoscopically, so she had laparotomy with cherney incision, trachelectomy, BSO for cyst, lysis of adhesions. There was a long cervical remnant still present and this was clearly completely removed grossly, but no dysplasia found in cervix on pathology.      PATHOLOGY:  A.  Periumbilical nodule: Dermatofibroma.  B.  Right fallopian tube and ovary: Ovary: Cortical inclusion cyst. Fallopian tube: Paratubal cyst.  C.  Left fallopian tube and ovary: Ovary: No specific pathologic change.  Fallopian tube: Paratubal cysts (up to 2.9 cm).  D.  Cervix: Squamous metaplasia. Leiomyoma (0.3 cm). There is no evidence of malignancy. (Levels examined.)  PAP 12/01/18 normal and no endocervical cells present.  HPV positive.  Persistent circumferential band in upper vagina, and vagina above this feels normal.   Pap 06/01/2019 NILM, HPV negative. PAP 12/07/2019 ASCUS, HPV negative. Endocervical cells seen on PAP Pap/HPV 7/21 normal  On 10/05/20 she underwent upper vaginectomy with Dr Sharlett Iles at [facility] for persistent abnormal PAP and endocervical cells on  PAP post trachelectomy and fibrous band in upper vagina.  Pathology on upper vagina was negative  for dysplasia or cancer.  Fragments of vagina with ulceration, fibrosis, and chronic inflammation.  Negative for dysplasia or carcinoma.  Problem List: Patient Active Problem List   Diagnosis Date Noted  . Tubular adenoma of colon 02/13/2020  . Morbid obesity with BMI of 40.0-44.9, adult ([place]) 08/10/2019  . Constipation 12/13/2018  . Abnormal EKG 09/14/2018  . DDD (degenerative disc disease), lumbar 08/26/2018  . Gastroesophageal reflux disease 01/14/2018  . HSIL on Pap smear of cervix 05/23/2016  . Localized edema 10/31/2015  . Elbow tendonitis 10/31/2015  . Mixed hyperlipidemia 09/28/2015  . Anal fistula 07/21/2013    Past Medical History: Past Medical History:  Diagnosis Date  . Arthritis    lower back  . Bursitis   . Esophageal reflux   . Hemorrhoids 2011  . HPV (human papilloma virus) infection   . Hyperlipidemia   . Hypertension     Past Surgical History: Past Surgical History:  Procedure Laterality Date  . ABDOMINAL HYSTERECTOMY  08/2018   abnormal pap  . CERVICAL BIOPSY  W/ LOOP ELECTRODE EXCISION  05/2017   [facility]  . COLONOSCOPY  2011   @ [facility]  . COLONOSCOPY WITH PROPOFOL N/A 02/10/2020   Procedure: COLONOSCOPY WITH BIOPSY;  Surgeon: Jonathon Bellows, MD;  Location: [place];  Service: Endoscopy;  Laterality: N/A;  priority 4  . hemorrhoids banding  2011  . POLYPECTOMY N/A 02/10/2020   Procedure: POLYPECTOMY;  Surgeon: Jonathon Bellows, MD;  Location: [place];  Service: Endoscopy;  Laterality: N/A;  . TONSILLECTOMY      Past Gynecologic History:  G2P2 History of abnormal Pap smears: Yes Last Pap: Per HPI Contraception: Post partial hysterectomy @ ~ age 5 Sexually active: Yes History of STDs: denies  OB History:  OB History  Gravida Para Term Preterm AB Living  2 2       2   SAB IAB Ectopic Multiple Live Births               # Outcome Date GA Lbr Len/2nd Weight Sex Delivery Anes PTL Lv  2 Para           1 Para              Obstetric Comments  1st Menstrual Cycle:  15  1st Pregnancy:  22    Family History: Family History  Problem Relation Age of Onset  . Breast cancer Maternal Aunt 26  . Breast cancer Cousin 66  . Heart failure Mother   . Lung cancer Maternal Uncle   . Lung cancer Maternal Aunt   . Lung cancer Maternal Uncle     Social History: Social History   Socioeconomic History  . Marital status: Married    Spouse name: Not on file  . Number of children: Not on file  . Years of education: Not on file  . Highest education level: Not on file  Occupational History  . Not on file  Tobacco Use  . Smoking status: Never Smoker  . Smokeless tobacco: Never Used  Vaping Use  . Vaping Use: Never used  Substance and Sexual Activity  . Alcohol use: Yes    Alcohol/week: 0.0 standard drinks    Comment: rarely  . Drug use: No  . Sexual activity: Yes    Birth control/protection: Surgical  Other Topics Concern  . Not on file  Social History Narrative  . Not on  file   Social Determinants of Health   Financial Resource Strain: Not on file  Food Insecurity: Not on file  Transportation Needs: Not on file  Physical Activity: Not on file  Stress: Not on file  Social Connections: Not on file  Intimate Partner Violence: Not on file   Allergies: Allergies  Allergen Reactions  . Codeine Itching  . Oxycodone Itching  . Ultram [Tramadol] Itching   Current Medications: Current Outpatient Medications  Medication Sig Dispense Refill  . DICLOFENAC SODIUM ER PO Take 5 mg by mouth in the morning and at bedtime.     . hydrochlorothiazide (HYDRODIURIL) 25 MG tablet TAKE 1 TABLET BY MOUTH DAILY . 90 tablet 0  . lactulose (CHRONULAC) 10 GM/15ML solution Take 15 mLs (10 g total) by mouth daily. (Patient not taking: Reported on 06/06/2020) 1892 mL 1  . Multiple Vitamins-Minerals (WOMENS 50+ MULTI VITAMIN/MIN PO) Take 1 tablet by mouth.    . Omega-3 Fatty Acids (FISH OIL PO) Take 1 capsule by mouth daily.      Marland Kitchen omeprazole (PRILOSEC) 20 MG capsule Take 1 capsule (20 mg total) by mouth 2 (two) times daily before a meal. 180 capsule 3  . simvastatin (ZOCOR) 20 MG tablet TAKE 1 TABLET BY MOUTH EVERY DAY 90 tablet 0   No current facility-administered medications for this visit.   Review of Systems General: no complaints  HEENT: no complaints  Lungs: no complaints  Cardiac: no complaints  GI: no complaints  GU: no complaints  Musculoskeletal: no complaints  Extremities: no complaints  Skin: no complaints  Neuro: no complaints  Endocrine: no complaints  Psych: no complaints       Objective:  Physical Examination:  BP (!) 163/84   Pulse 72   Temp 98.7 F (37.1 C)   Resp 20   Wt 269 lb 8 oz (122.2 kg)   SpO2 99%   BMI 46.26 kg/m   ECOG Performance Status: 0 - Asymptomatic  EXAM Physical Exam Constitutional:      Appearance: Normal appearance. She is obese.  HENT:     Head: Normocephalic.     Nose: Nose normal.     Mouth/Throat:     Mouth: Mucous membranes are moist.     Pharynx: Oropharynx is clear.  Eyes:     Conjunctiva/sclera: Conjunctivae normal.  Neck:     Thyroid: No thyroid mass.     Trachea: Trachea normal.  Cardiovascular:     Rate and Rhythm: Normal rate and regular rhythm.     Pulses: Normal pulses.     Heart sounds: Normal heart sounds.  Pulmonary:     Effort: Pulmonary effort is normal.     Breath sounds: Normal breath sounds.  Chest:  Breasts:     Right: No axillary adenopathy or supraclavicular adenopathy.     Left: No axillary adenopathy or supraclavicular adenopathy.    Abdominal:     General: Bowel sounds are normal.     Palpations: Abdomen is soft.  Genitourinary:    General: Normal vulva.     Rectum: Normal.  Lymphadenopathy:     Cervical:     Right cervical: No superficial cervical adenopathy.    Left cervical: No superficial cervical adenopathy.     Upper Body:     Right upper body: No supraclavicular or axillary adenopathy.      Left upper body: No supraclavicular or axillary adenopathy.     Lower Body: No right inguinal adenopathy. No left inguinal adenopathy.  Skin:  General: Skin is warm and dry.     Capillary Refill: Capillary refill takes 2 to 3 seconds.  Neurological:     General: No focal deficit present.     Mental Status: She is alert.     Cranial Nerves: Cranial nerves are intact.     Sensory: Sensation is intact.     Motor: Motor function is intact.  Psychiatric:        Attention and Perception: Attention normal.        Mood and Affect: Mood and affect normal.        Speech: Speech normal.        Behavior: Behavior normal. Behavior is cooperative.        Thought Content: Thought content normal.        Cognition and Memory: Cognition and memory normal.        Judgment: Judgment normal.    Pelvic: Exam Chaperoned by NP EGBUS: no lesions Vagina: A bit shortened.  Apex now normal and well healed.  No lesions.  Bimanual: no masses or tenderness.  Top of vagina intact.  Rectovaginal: no masses above vagina.     Assessment:  Toni Kelly is a 60 y.o. P2 female diagnosed with persistent HSIL PAP, HR HPV s/p negative LEEP 9/18 and multiple colposcopies. PAP 7/19 HSIL/HPV negative.  She had prior supracervical hysterectomy in remote past for menorrhagia with preservation of ovaries.  Unclear where the HSIL cells were arising and MRI showed normal cervix.  Long residual cervix removed completely 09/21/18 via Cheney incision at [facility].  Upper vaginal tissue looked normal after cervix amputated.  Pathology did not show any evidence of HSIL. PAP 12/01/18 normal and no endocervical cells present.  HPV positive.  Persistent circumferential band in upper vagina, and vagina above this feels normal.  Pap 06/01/2019 NILM, HPV negative. PAP 1/21 ASCUS with endocervical component present.    On 10/05/20 she underwent upper vaginectomy with Dr Sharlett Iles at [facility] for persistent abnormal PAP and endocervical cells on PAP post  trachelectomy and fibrous band in upper vagina.  Pathology on upper vagina was negative for dysplasia or cancer, but with ulceration, fibrosis, and chronic inflammation.   Normal exam today with cuff well healed and normal looking.  Vagina is a bit short post upper vaginectomy.   Small left paraovarian cyst associated with pain and removed with BSO and benign pathology.  Hot flashes- resolved.   Plan:   Problem List Items Addressed This Visit      Other   HSIL on Pap smear of cervix - Primary     If PAP today normal, RTC in 12 months for repeat PAP/HPV with Dr Kathlyn [place].   Mellody Drown, MD  CC:  Glean Hess, [address] [place],  [place] [address] [phone]

## 2020-12-24 ENCOUNTER — Telehealth: Payer: Self-pay

## 2020-12-24 LAB — IGP, APTIMA HPV: HPV Aptima: NEGATIVE

## 2020-12-24 NOTE — Telephone Encounter (Signed)
Called Toni Kelly with her negative pap smear results. She will return to Dr. Georgianne Fick in one year.

## 2021-01-15 ENCOUNTER — Encounter: Payer: Self-pay | Admitting: Internal Medicine

## 2021-01-15 DIAGNOSIS — Z90710 Acquired absence of both cervix and uterus: Secondary | ICD-10-CM | POA: Insufficient documentation

## 2021-01-17 ENCOUNTER — Ambulatory Visit (INDEPENDENT_AMBULATORY_CARE_PROVIDER_SITE_OTHER): Payer: BC Managed Care – PPO | Admitting: Internal Medicine

## 2021-01-17 ENCOUNTER — Other Ambulatory Visit: Payer: Self-pay | Admitting: Internal Medicine

## 2021-01-17 ENCOUNTER — Encounter: Payer: Self-pay | Admitting: Internal Medicine

## 2021-01-17 ENCOUNTER — Other Ambulatory Visit: Payer: Self-pay

## 2021-01-17 ENCOUNTER — Other Ambulatory Visit: Payer: Self-pay | Admitting: Obstetrics and Gynecology

## 2021-01-17 VITALS — BP 118/74 | HR 84 | Temp 98.3°F | Ht 64.0 in | Wt 265.0 lb

## 2021-01-17 DIAGNOSIS — E782 Mixed hyperlipidemia: Secondary | ICD-10-CM | POA: Diagnosis not present

## 2021-01-17 DIAGNOSIS — R600 Localized edema: Secondary | ICD-10-CM | POA: Diagnosis not present

## 2021-01-17 DIAGNOSIS — Z0000 Encounter for general adult medical examination without abnormal findings: Secondary | ICD-10-CM

## 2021-01-17 DIAGNOSIS — R7303 Prediabetes: Secondary | ICD-10-CM | POA: Diagnosis not present

## 2021-01-17 DIAGNOSIS — K219 Gastro-esophageal reflux disease without esophagitis: Secondary | ICD-10-CM

## 2021-01-17 DIAGNOSIS — Z1231 Encounter for screening mammogram for malignant neoplasm of breast: Secondary | ICD-10-CM

## 2021-01-17 DIAGNOSIS — Z6841 Body Mass Index (BMI) 40.0 and over, adult: Secondary | ICD-10-CM

## 2021-01-17 MED ORDER — HYDROCHLOROTHIAZIDE 25 MG PO TABS: 25.0000 mg | ORAL_TABLET | Freq: Every day | ORAL | 3 refills | Status: DC

## 2021-01-17 MED ORDER — SIMVASTATIN 20 MG PO TABS: 20.0000 mg | ORAL_TABLET | Freq: Every day | ORAL | 3 refills | Status: DC

## 2021-01-17 MED ORDER — OMEPRAZOLE 20 MG PO CPDR
20.0000 mg | DELAYED_RELEASE_CAPSULE | Freq: Two times a day (BID) | ORAL | 3 refills | Status: DC
Start: 2021-01-17 — End: 2021-11-26

## 2021-01-17 NOTE — Progress Notes (Signed)
Date:  01/17/2021   Name:  Toni Kelly   DOB:  November 09, 1961   MRN:  [id number]   Chief Complaint: Annual Exam (No Breast exam no pap (GYN))  Toni Kelly is a 60 y.o. female who presents today for her Complete Annual Exam. She feels well. She reports exercising none. She reports she is sleeping well. Breast complaints none.  Mammogram: 05/2020 DEXA: none Pap smear: 11/2020 neg - GYN Colonoscopy: 01/2020 repeat 7 yrs  Immunization History  Administered Date(s) Administered  . Influenza,inj,Quad PF,6+ Mos 08/08/2018, 08/10/2020  . Influenza,inj,quad, With Preservative 07/26/2019  . Influenza-Unspecified 09/09/2015, 09/07/2017  . PFIZER(Purple Top)SARS-COV-2 Vaccination 02/03/2020, 02/28/2020, 08/25/2020  . Pneumococcal Polysaccharide-23 07/26/2019  . Zoster Recombinat (Shingrix) 03/21/2019, 07/29/2019    Hyperlipidemia This is a chronic problem. The problem is controlled. Pertinent negatives include no chest pain or shortness of breath. Current antihyperlipidemic treatment includes statins. The current treatment provides significant improvement of lipids.  Gastroesophageal Reflux She complains of heartburn. She reports no abdominal pain, no chest pain, no coughing or no wheezing. This is a recurrent problem. Pertinent negatives include no fatigue. She has tried a PPI for the symptoms. The treatment provided significant relief.    Lab Results  Component Value Date   CREATININE 0.94 12/15/2019   BUN 19 12/15/2019   NA 143 12/15/2019   K 4.7 12/15/2019   CL 104 12/15/2019   CO2 24 12/15/2019   Lab Results  Component Value Date   CHOL 185 12/15/2019   HDL 55 12/15/2019   LDLCALC 101 (H) 12/15/2019   TRIG 166 (H) 12/15/2019   CHOLHDL 3.4 12/15/2019   Lab Results  Component Value Date   TSH 0.888 03/13/2020   Lab Results  Component Value Date   HGBA1C 5.8 (H) 12/15/2019   Lab Results  Component Value Date   WBC 6.2 12/15/2019   HGB 14.2 12/15/2019   HCT 43.1  12/15/2019   MCV 90 12/15/2019   PLT 247 12/15/2019   Lab Results  Component Value Date   ALT 78 (H) 12/15/2019   AST 86 (H) 12/15/2019   ALKPHOS 79 12/15/2019   BILITOT 0.4 12/15/2019     Review of Systems  Constitutional: Negative for chills, fatigue and fever.  HENT: Negative for congestion, hearing loss, tinnitus, trouble swallowing and voice change.   Eyes: Negative for visual disturbance.  Respiratory: Negative for cough, chest tightness, shortness of breath and wheezing.   Cardiovascular: Negative for chest pain, palpitations and leg swelling.  Gastrointestinal: Positive for heartburn. Negative for abdominal pain, constipation, diarrhea and vomiting.  Endocrine: Negative for polydipsia and polyuria.  Genitourinary: Negative for dysuria, frequency, genital sores, vaginal bleeding and vaginal discharge.  Musculoskeletal: Negative for arthralgias, gait problem and joint swelling.  Skin: Negative for color change and rash.  Neurological: Negative for dizziness, tremors, light-headedness and headaches.  Hematological: Negative for adenopathy. Does not bruise/bleed easily.  Psychiatric/Behavioral: Negative for dysphoric mood and sleep disturbance. The patient is not nervous/anxious.     Patient Active Problem List   Diagnosis Date Noted  . S/P total hysterectomy 01/15/2021  . Vaginal dysplasia 12/03/2020  . Tubular adenoma of colon 02/13/2020  . Morbid obesity with BMI of 40.0-44.9, adult ([place]) 08/10/2019  . Constipation 12/13/2018  . Abnormal EKG 09/14/2018  . DDD (degenerative disc disease), lumbar 08/26/2018  . Gastroesophageal reflux disease 01/14/2018  . HSIL on Pap smear of cervix 05/23/2016  . Localized edema 10/31/2015  . Elbow tendonitis 10/31/2015  . Mixed  hyperlipidemia 09/28/2015  . Anal fistula 07/21/2013    Allergies  Allergen Reactions  . Codeine Itching  . Oxycodone Itching  . Ultram [Tramadol] Itching    Past Surgical History:  Procedure  Laterality Date  . ABDOMINAL HYSTERECTOMY  08/2018   abnormal pap  . CERVICAL BIOPSY  W/ LOOP ELECTRODE EXCISION  05/2017   [facility]  . COLONOSCOPY  2011   @ [facility]  . COLONOSCOPY WITH PROPOFOL N/A 02/10/2020   Procedure: COLONOSCOPY WITH BIOPSY;  Surgeon: Jonathon Bellows, MD;  Location: [place];  Service: Endoscopy;  Laterality: N/A;  priority 4  . hemorrhoids banding  2011  . POLYPECTOMY N/A 02/10/2020   Procedure: POLYPECTOMY;  Surgeon: Jonathon Bellows, MD;  Location: [place];  Service: Endoscopy;  Laterality: N/A;  . TONSILLECTOMY    . VAGINECTOMY, PARTIAL  09/2020   negative for dysplasia or malignancy    Social History   Tobacco Use  . Smoking status: Never Smoker  . Smokeless tobacco: Never Used  Vaping Use  . Vaping Use: Never used  Substance Use Topics  . Alcohol use: Yes    Alcohol/week: 0.0 standard drinks    Comment: rarely  . Drug use: No     Medication list has been reviewed and updated.  Current Meds  Medication Sig  . cyanocobalamin 1000 MCG tablet Take by mouth.  . hydrochlorothiazide (HYDRODIURIL) 25 MG tablet TAKE 1 TABLET BY MOUTH DAILY .  . ibuprofen (ADVIL) 600 MG tablet Take 600 mg by mouth every 6 (six) hours as needed.  . Multiple Vitamins-Minerals (WOMENS 50+ MULTI VITAMIN/MIN PO) Take 1 tablet by mouth.  . Omega-3 Fatty Acids (FISH OIL PO) Take 1 capsule by mouth daily.   Marland Kitchen omeprazole (PRILOSEC) 20 MG capsule Take 1 capsule (20 mg total) by mouth 2 (two) times daily before a meal.  . simvastatin (ZOCOR) 20 MG tablet TAKE 1 TABLET BY MOUTH EVERY DAY    PHQ 2/9 Scores 01/17/2021 04/11/2020 12/15/2019 12/31/2018  PHQ - 2 Score 0 0 0 0  PHQ- 9 Score 0 0 - -    GAD 7 : Generalized Anxiety Score 01/17/2021 04/11/2020  Nervous, Anxious, on Edge 0 0  Control/stop worrying 0 0  Worry too much - different things 0 0  Trouble relaxing 0 0  Restless 0 0  Easily annoyed or irritable 0 0  Afraid - awful might happen 0 0  Total GAD 7 Score 0  0  Anxiety Difficulty - Not difficult at all    BP Readings from Last 3 Encounters:  01/17/21 118/74  12/19/20 (!) 163/84  06/06/20 (!) 123/98    Physical Exam Vitals and nursing note reviewed.  Constitutional:      General: She is not in acute distress.    Appearance: She is well-developed.  HENT:     Head: Normocephalic and atraumatic.     Right Ear: Tympanic membrane and ear canal normal.     Left Ear: Tympanic membrane and ear canal normal.     Nose:     Right Sinus: No maxillary sinus tenderness.     Left Sinus: No maxillary sinus tenderness.  Eyes:     General: No scleral icterus.       Right eye: No discharge.        Left eye: No discharge.     Conjunctiva/sclera: Conjunctivae normal.  Neck:     Thyroid: No thyromegaly.     Vascular: No carotid bruit.  Cardiovascular:  Rate and Rhythm: Normal rate and regular rhythm.     Pulses: Normal pulses.     Heart sounds: Normal heart sounds.  Pulmonary:     Effort: Pulmonary effort is normal. No respiratory distress.     Breath sounds: No wheezing.  Abdominal:     General: Bowel sounds are normal.     Palpations: Abdomen is soft.     Tenderness: There is no abdominal tenderness.  Musculoskeletal:     Cervical back: Normal range of motion. No erythema.     Right lower leg: No edema.     Left lower leg: No edema.  Lymphadenopathy:     Cervical: No cervical adenopathy.  Skin:    General: Skin is warm and dry.     Capillary Refill: Capillary refill takes less than 2 seconds.     Findings: No rash.  Neurological:     General: No focal deficit present.     Mental Status: She is alert and oriented to person, place, and time.     Cranial Nerves: No cranial nerve deficit.     Sensory: No sensory deficit.     Deep Tendon Reflexes: Reflexes are normal and symmetric.  Psychiatric:        Attention and Perception: Attention normal.        Mood and Affect: Mood normal.     Wt Readings from Last 3 Encounters:   01/17/21 265 lb (120.2 kg)  12/19/20 269 lb 8 oz (122.2 kg)  06/06/20 267 lb (121.1 kg)    BP 118/74   Pulse 84   Temp 98.3 F (36.8 C) (Oral)   Ht 5\' 4"  (1.626 m)   Wt 265 lb (120.2 kg)   SpO2 96%   BMI 45.49 kg/m   Assessment and Plan: 1. Annual physical exam Exam is normal except for weight. Encourage regular exercise and appropriate dietary changes.  2. Gastroesophageal reflux disease without esophagitis Symptoms well controlled on daily PPI No red flag signs such as weight loss, n/v, melena Will continue daily PPI. - CBC with Differential/Platelet - omeprazole (PRILOSEC) 20 MG capsule; Take 1 capsule (20 mg total) by mouth 2 (two) times daily before a meal.  Dispense: 180 capsule; Refill: 3  3. Mixed hyperlipidemia Tolerating statin medication without side effects at this time LDL is at goal of < 70 on current dose Continue same therapy without change at this time. - Lipid panel - simvastatin (ZOCOR) 20 MG tablet; Take 1 tablet (20 mg total) by mouth daily.  Dispense: 90 tablet; Refill: 3  4. Localized edema Controlled with daily HCTZ - Comprehensive metabolic panel - TSH - hydrochlorothiazide (HYDRODIURIL) 25 MG tablet; Take 1 tablet (25 mg total) by mouth daily.  Dispense: 90 tablet; Refill: 3  5. Prediabetes Reinforced diet changes - she has made some improvements and lost a few pounds Will monitor A1C and continue to encourage diet change - Hemoglobin A1c  6. BMI 45.0-49.9, adult [facility]) Diet discussed Unable to exercise due to orthopedic issues   Partially dictated using Dragon software. Any errors are unintentional.  Halina Maidens, MD [facility]  01/17/2021

## 2021-01-18 LAB — CBC WITH DIFFERENTIAL/PLATELET
Basophils Absolute: 0.1 10*3/uL (ref 0.0–0.2)
Basos: 1 %
EOS (ABSOLUTE): 0.1 10*3/uL (ref 0.0–0.4)
Eos: 3 %
Hematocrit: 43 % (ref 34.0–46.6)
Hemoglobin: 14.2 g/dL (ref 11.1–15.9)
Immature Grans (Abs): 0 10*3/uL (ref 0.0–0.1)
Immature Granulocytes: 0 %
Lymphocytes Absolute: 1.6 10*3/uL (ref 0.7–3.1)
Lymphs: 32 %
MCH: 29.8 pg (ref 26.6–33.0)
MCHC: 33 g/dL (ref 31.5–35.7)
MCV: 90 fL (ref 79–97)
Monocytes Absolute: 0.5 10*3/uL (ref 0.1–0.9)
Monocytes: 9 %
Neutrophils Absolute: 2.8 10*3/uL (ref 1.4–7.0)
Neutrophils: 55 %
Platelets: 241 10*3/uL (ref 150–450)
RBC: 4.76 x10E6/uL (ref 3.77–5.28)
RDW: 13.1 % (ref 11.7–15.4)
WBC: 5 10*3/uL (ref 3.4–10.8)

## 2021-01-18 LAB — LIPID PANEL
Chol/HDL Ratio: 3.3 ratio (ref 0.0–4.4)
Cholesterol, Total: 195 mg/dL (ref 100–199)
HDL: 60 mg/dL (ref >39)
LDL Chol Calc (NIH): 112 mg/dL — ABNORMAL HIGH (ref 0–99)
Triglycerides: 130 mg/dL (ref 0–149)
VLDL Cholesterol Cal: 23 mg/dL (ref 5–40)

## 2021-01-18 LAB — COMPREHENSIVE METABOLIC PANEL
ALT: 69 IU/L — ABNORMAL HIGH (ref 0–32)
AST: 76 IU/L — ABNORMAL HIGH (ref 0–40)
Albumin/Globulin Ratio: 1.3 (ref 1.2–2.2)
Albumin: 4.1 g/dL (ref 3.8–4.8)
Alkaline Phosphatase: 70 IU/L (ref 44–121)
BUN/Creatinine Ratio: 13 (ref 12–28)
BUN: 14 mg/dL (ref 8–27)
Bilirubin Total: 0.5 mg/dL (ref 0.0–1.2)
CO2: 25 mmol/L (ref 20–29)
Calcium: 9.4 mg/dL (ref 8.7–10.3)
Chloride: 104 mmol/L (ref 96–106)
Creatinine, Ser: 1.05 mg/dL — ABNORMAL HIGH (ref 0.57–1.00)
GFR calc Af Amer: 66 mL/min/{1.73_m2} (ref >59)
GFR calc non Af Amer: 57 mL/min/{1.73_m2} — ABNORMAL LOW (ref >59)
Globulin, Total: 3.2 g/dL (ref 1.5–4.5)
Glucose: 106 mg/dL — ABNORMAL HIGH (ref 65–99)
Potassium: 4.5 mmol/L (ref 3.5–5.2)
Sodium: 144 mmol/L (ref 134–144)
Total Protein: 7.3 g/dL (ref 6.0–8.5)

## 2021-01-18 LAB — HEMOGLOBIN A1C
Est. average glucose Bld gHb Est-mCnc: 120 mg/dL
Hgb A1c MFr Bld: 5.8 % — ABNORMAL HIGH (ref 4.8–5.6)

## 2021-01-18 LAB — TSH: TSH: 1.03 u[IU]/mL (ref 0.450–4.500)

## 2021-01-22 DIAGNOSIS — R7309 Other abnormal glucose: Secondary | ICD-10-CM | POA: Diagnosis not present

## 2021-02-22 DIAGNOSIS — R7309 Other abnormal glucose: Secondary | ICD-10-CM | POA: Diagnosis not present

## 2021-03-24 DIAGNOSIS — R7309 Other abnormal glucose: Secondary | ICD-10-CM | POA: Diagnosis not present

## 2021-04-06 ENCOUNTER — Encounter: Payer: Self-pay | Admitting: Internal Medicine

## 2021-04-08 ENCOUNTER — Encounter: Payer: Self-pay | Admitting: Internal Medicine

## 2021-04-08 ENCOUNTER — Ambulatory Visit (INDEPENDENT_AMBULATORY_CARE_PROVIDER_SITE_OTHER): Payer: BC Managed Care – PPO | Admitting: Internal Medicine

## 2021-04-08 ENCOUNTER — Other Ambulatory Visit: Payer: Self-pay

## 2021-04-08 VITALS — BP 124/78 | HR 84 | Temp 98.5°F | Ht 64.0 in | Wt 262.0 lb

## 2021-04-08 DIAGNOSIS — J0100 Acute maxillary sinusitis, unspecified: Secondary | ICD-10-CM | POA: Diagnosis not present

## 2021-04-08 MED ORDER — AMOXICILLIN-POT CLAVULANATE 875-125 MG PO TABS: 1.0000 | ORAL_TABLET | Freq: Two times a day (BID) | ORAL | 0 refills | Status: AC

## 2021-04-08 NOTE — Progress Notes (Signed)
Date:  04/08/2021   Name:  Toni Kelly   DOB:  07-05-1961   MRN:  [id number]   Chief Complaint: Cough (Cough-green thick mucous, scratchy throat. Headache. Neg Covid test this weekend. Started Thursday of last week. Been taking OTC things but no relief. )  Cough This is a new problem. The current episode started in the past 7 days. The problem has been unchanged. The problem occurs every few minutes. The cough is productive of sputum. Associated symptoms include headaches, postnasal drip and a sore throat. Pertinent negatives include no chest pain, chills, ear congestion, fever, shortness of breath or wheezing. The symptoms are aggravated by lying down. She has tried OTC cough suppressant (cold and sinus medication) for the symptoms.    Lab Results  Component Value Date   CREATININE 1.05 (H) 01/17/2021   BUN 14 01/17/2021   NA 144 01/17/2021   K 4.5 01/17/2021   CL 104 01/17/2021   CO2 25 01/17/2021   Lab Results  Component Value Date   CHOL 195 01/17/2021   HDL 60 01/17/2021   LDLCALC 112 (H) 01/17/2021   TRIG 130 01/17/2021   CHOLHDL 3.3 01/17/2021   Lab Results  Component Value Date   TSH 1.030 01/17/2021   Lab Results  Component Value Date   HGBA1C 5.8 (H) 01/17/2021   Lab Results  Component Value Date   WBC 5.0 01/17/2021   HGB 14.2 01/17/2021   HCT 43.0 01/17/2021   MCV 90 01/17/2021   PLT 241 01/17/2021   Lab Results  Component Value Date   ALT 69 (H) 01/17/2021   AST 76 (H) 01/17/2021   ALKPHOS 70 01/17/2021   BILITOT 0.5 01/17/2021     Review of Systems  Constitutional: Negative for chills, fatigue and fever.  HENT: Positive for postnasal drip and sore throat. Negative for trouble swallowing.   Eyes: Negative for visual disturbance.  Respiratory: Positive for cough. Negative for shortness of breath and wheezing.   Cardiovascular: Negative for chest pain and palpitations.  Neurological: Positive for headaches. Negative for dizziness.     Patient Active Problem List   Diagnosis Date Noted  . S/P total hysterectomy 01/15/2021  . Vaginal dysplasia 12/03/2020  . Tubular adenoma of colon 02/13/2020  . Morbid obesity with BMI of 40.0-44.9, adult ([place]) 08/10/2019  . Constipation 12/13/2018  . Abnormal EKG 09/14/2018  . DDD (degenerative disc disease), lumbar 08/26/2018  . Gastroesophageal reflux disease 01/14/2018  . HSIL on Pap smear of cervix 05/23/2016  . Localized edema 10/31/2015  . Elbow tendonitis 10/31/2015  . Mixed hyperlipidemia 09/28/2015  . Anal fistula 07/21/2013    Allergies  Allergen Reactions  . Codeine Itching  . Oxycodone Itching  . Ultram [Tramadol] Itching    Past Surgical History:  Procedure Laterality Date  . ABDOMINAL HYSTERECTOMY  08/2018   abnormal pap  . CERVICAL BIOPSY  W/ LOOP ELECTRODE EXCISION  05/2017   [facility]  . COLONOSCOPY  2011   @ [facility]  . COLONOSCOPY WITH PROPOFOL N/A 02/10/2020   Procedure: COLONOSCOPY WITH BIOPSY;  Surgeon: Jonathon Bellows, MD;  Location: [facility];  Service: Endoscopy;  Laterality: N/A;  priority 4  . hemorrhoids banding  2011  . POLYPECTOMY N/A 02/10/2020   Procedure: POLYPECTOMY;  Surgeon: Jonathon Bellows, MD;  Location: [facility];  Service: Endoscopy;  Laterality: N/A;  . TONSILLECTOMY    . VAGINECTOMY, PARTIAL  09/2020   negative for dysplasia or malignancy    Social History  Tobacco Use  . Smoking status: Never Smoker  . Smokeless tobacco: Never Used  Vaping Use  . Vaping Use: Never used  Substance Use Topics  . Alcohol use: Yes    Alcohol/week: 0.0 standard drinks    Comment: rarely  . Drug use: No     Medication list has been reviewed and updated.  Current Meds  Medication Sig  . hydrochlorothiazide (HYDRODIURIL) 25 MG tablet Take 1 tablet (25 mg total) by mouth daily.  . Multiple Vitamins-Minerals (WOMENS 50+ MULTI VITAMIN/MIN PO) Take 1 tablet by mouth.  . Omega-3 Fatty Acids (FISH OIL PO) Take 1 capsule by mouth  daily.   Marland Kitchen omeprazole (PRILOSEC) 20 MG capsule Take 1 capsule (20 mg total) by mouth 2 (two) times daily before a meal.  . simvastatin (ZOCOR) 20 MG tablet Take 1 tablet (20 mg total) by mouth daily.    PHQ 2/9 Scores 04/08/2021 01/17/2021 04/11/2020 12/15/2019  PHQ - 2 Score 0 0 0 0  PHQ- 9 Score 0 0 0 -    GAD 7 : Generalized Anxiety Score 04/08/2021 01/17/2021 04/11/2020  Nervous, Anxious, on Edge 0 0 0  Control/stop worrying 0 0 0  Worry too much - different things 0 0 0  Trouble relaxing 0 0 0  Restless 0 0 0  Easily annoyed or irritable 0 0 0  Afraid - awful might happen 0 0 0  Total GAD 7 Score 0 0 0  Anxiety Difficulty Not difficult at all - Not difficult at all    BP Readings from Last 3 Encounters:  04/08/21 124/78  01/17/21 118/74  12/19/20 (!) 163/84    Physical Exam Constitutional:      Appearance: Normal appearance. She is well-developed.  HENT:     Right Ear: Ear canal and external ear normal. Tympanic membrane is not erythematous or retracted.     Left Ear: Ear canal and external ear normal. Tympanic membrane is not erythematous or retracted.     Nose:     Right Sinus: Maxillary sinus tenderness present. No frontal sinus tenderness.     Left Sinus: Maxillary sinus tenderness present. No frontal sinus tenderness.     Mouth/Throat:     Mouth: No oral lesions.     Pharynx: Uvula midline. Posterior oropharyngeal erythema present. No oropharyngeal exudate.  Cardiovascular:     Rate and Rhythm: Normal rate and regular rhythm.     Pulses: Normal pulses.     Heart sounds: Normal heart sounds.  Pulmonary:     Breath sounds: Normal breath sounds. No wheezing or rales.  Lymphadenopathy:     Cervical: No cervical adenopathy.  Neurological:     Mental Status: She is alert and oriented to person, place, and time.     Wt Readings from Last 3 Encounters:  04/08/21 262 lb (118.8 kg)  01/17/21 265 lb (120.2 kg)  12/19/20 269 lb 8 oz (122.2 kg)    BP 124/78   Pulse  84   Temp 98.5 F (36.9 C) (Oral)   Ht 5\' 4"  (1.626 m)   Wt 262 lb (118.8 kg)   SpO2 96%   BMI 44.97 kg/m   Assessment and Plan: 1. Acute non-recurrent maxillary sinusitis Recommend Coricidin HBP for congestion and Delsym as needed for cough Increase fluids, rest Follow up if worsening or no improvement - amoxicillin-clavulanate (AUGMENTIN) 875-125 MG tablet; Take 1 tablet by mouth 2 (two) times daily for 10 days.  Dispense: 20 tablet; Refill: 0   Partially dictated  using Editor, commissioning. Any errors are unintentional.  Halina Maidens, MD [facility]  04/08/2021

## 2021-04-08 NOTE — Patient Instructions (Signed)
Coricidin HBP - take this as directed for congestion  Delsym cough syrup if needed

## 2021-04-15 ENCOUNTER — Encounter: Payer: Self-pay | Admitting: Internal Medicine

## 2021-04-18 ENCOUNTER — Other Ambulatory Visit: Payer: Self-pay | Admitting: Internal Medicine

## 2021-04-18 DIAGNOSIS — J0100 Acute maxillary sinusitis, unspecified: Secondary | ICD-10-CM

## 2021-04-18 MED ORDER — CLINDAMYCIN HCL 300 MG PO CAPS: 300.0000 mg | ORAL_CAPSULE | Freq: Three times a day (TID) | ORAL | 0 refills | Status: AC

## 2021-04-24 DIAGNOSIS — R7309 Other abnormal glucose: Secondary | ICD-10-CM | POA: Diagnosis not present

## 2021-05-24 DIAGNOSIS — R7309 Other abnormal glucose: Secondary | ICD-10-CM | POA: Diagnosis not present

## 2021-06-11 ENCOUNTER — Encounter: Payer: Self-pay | Admitting: Internal Medicine

## 2021-06-11 DIAGNOSIS — G5601 Carpal tunnel syndrome, right upper limb: Secondary | ICD-10-CM | POA: Diagnosis not present

## 2021-06-11 DIAGNOSIS — M79644 Pain in right finger(s): Secondary | ICD-10-CM | POA: Diagnosis not present

## 2021-06-11 DIAGNOSIS — G8929 Other chronic pain: Secondary | ICD-10-CM | POA: Diagnosis not present

## 2021-06-17 ENCOUNTER — Ambulatory Visit
Admission: RE | Admit: 2021-06-17 | Discharge: 2021-06-17 | Disposition: A | Discharge status: Home / Self Care | Payer: BC Managed Care – PPO | Source: Ambulatory Visit | Attending: Internal Medicine | Admitting: Internal Medicine

## 2021-06-17 ENCOUNTER — Other Ambulatory Visit: Payer: Self-pay

## 2021-06-17 DIAGNOSIS — Z1231 Encounter for screening mammogram for malignant neoplasm of breast: Secondary | ICD-10-CM

## 2021-06-24 DIAGNOSIS — R7309 Other abnormal glucose: Secondary | ICD-10-CM | POA: Diagnosis not present

## 2021-07-15 DIAGNOSIS — G5601 Carpal tunnel syndrome, right upper limb: Secondary | ICD-10-CM | POA: Diagnosis not present

## 2021-07-19 DIAGNOSIS — H40003 Preglaucoma, unspecified, bilateral: Secondary | ICD-10-CM | POA: Diagnosis not present

## 2021-07-25 DIAGNOSIS — R7309 Other abnormal glucose: Secondary | ICD-10-CM | POA: Diagnosis not present

## 2021-08-21 DIAGNOSIS — R200 Anesthesia of skin: Secondary | ICD-10-CM | POA: Diagnosis not present

## 2021-08-24 DIAGNOSIS — Z7189 Other specified counseling: Secondary | ICD-10-CM | POA: Diagnosis not present

## 2021-08-24 DIAGNOSIS — R7309 Other abnormal glucose: Secondary | ICD-10-CM | POA: Diagnosis not present

## 2021-09-23 DIAGNOSIS — L400 Psoriasis vulgaris: Secondary | ICD-10-CM | POA: Diagnosis not present

## 2021-09-24 DIAGNOSIS — R7309 Other abnormal glucose: Secondary | ICD-10-CM | POA: Diagnosis not present

## 2021-09-24 DIAGNOSIS — Z7189 Other specified counseling: Secondary | ICD-10-CM | POA: Diagnosis not present

## 2021-10-22 ENCOUNTER — Ambulatory Visit: Payer: Self-pay

## 2021-10-22 NOTE — Telephone Encounter (Signed)
Patient called in to say that her husband was diagnosed with positive Covid on 10/21/21 and she took a test but it was negative but she have a scratchy throat, and achy body. Say she need to know what to do since he received medication Paxlovid. Please call Ph# [phone]   Called pt and LM on VM to call back.

## 2021-10-22 NOTE — Telephone Encounter (Signed)
Reason for Disposition . [1] COVID-19 infection suspected by caller or triager AND [2] mild symptoms (cough, fever, or others) AND [3] negative COVID-19 rapid test  Answer Assessment - Initial Assessment Questions 1. COVID-19 DIAGNOSIS: "Who made your COVID-19 diagnosis?" "Was it confirmed by a positive lab test or self-test?" If not diagnosed by a doctor (or NP/PA), ask "Are there lots of cases (community spread) where you live?" Note: See public health department website, if unsure.     Covid at home test negative husband positive . 2. COVID-19 EXPOSURE: "Was there any known exposure to COVID before the symptoms began?" CDC Definition of close contact: within 6 feet (2 meters) for a total of 15 minutes or more over a 24-hour period.      *No Answer* 3. ONSET: "When did the COVID-19 symptoms start?"      *No Answer* 4. WORST SYMPTOM: "What is your worst symptom?" (e.g., cough, fever, shortness of breath, muscle aches)     *No Answer* 5. COUGH: "Do you have a cough?" If Yes, ask: "How bad is the cough?"       *No Answer* 6. FEVER: "Do you have a fever?" If Yes, ask: "What is your temperature, how was it measured, and when did it start?"     *No Answer* 7. RESPIRATORY STATUS: "Describe your breathing?" (e.g., shortness of breath, wheezing, unable to speak)      *No Answer* 8. BETTER-SAME-WORSE: "Are you getting better, staying the same or getting worse compared to yesterday?"  If getting worse, ask, "In what way?"     *No Answer* 9. HIGH RISK DISEASE: "Do you have any chronic medical problems?" (e.g., asthma, heart or lung disease, weak immune system, obesity, etc.)     No  10. VACCINE: "Have you had the COVID-19 vaccine?" If Yes, ask: "Which one, how many shots, when did you get it?"       X 2 Pfizer  11. BOOSTER: "Have you received your COVID-19 booster?" If Yes, ask: "Which one and when did you get it?"       Up to date on all boosters [place]  12. PREGNANCY: "Is there any chance you are  pregnant?" "When was your last menstrual period?"       *No Answer* 13. OTHER SYMPTOMS: "Do you have any other symptoms?"  (e.g., chills, fatigue, headache, loss of smell or taste, muscle pain, sore throat)       *No Answer* 14. O2 SATURATION MONITOR:  "Do you use an oxygen saturation monitor (pulse oximeter) at home?" If Yes, ask "What is your reading (oxygen level) today?" "What is your usual oxygen saturation reading?" (e.g., 95%)       *No Answer*  Protocols used: Coronavirus (COVID-19) Diagnosed or Suspected-A-AH

## 2021-10-23 ENCOUNTER — Telehealth: Payer: Self-pay | Admitting: Internal Medicine

## 2021-10-23 ENCOUNTER — Telehealth (INDEPENDENT_AMBULATORY_CARE_PROVIDER_SITE_OTHER): Payer: BC Managed Care – PPO | Admitting: Internal Medicine

## 2021-10-23 ENCOUNTER — Other Ambulatory Visit: Payer: Self-pay

## 2021-10-23 ENCOUNTER — Encounter: Payer: Self-pay | Admitting: Internal Medicine

## 2021-10-23 VITALS — Temp 100.1°F | Ht 64.0 in

## 2021-10-23 DIAGNOSIS — U071 COVID-19: Secondary | ICD-10-CM | POA: Diagnosis not present

## 2021-10-23 MED ORDER — MOLNUPIRAVIR EUA 200MG CAPSULE: 4.0000 | ORAL_CAPSULE | Freq: Two times a day (BID) | ORAL | 0 refills | Status: AC

## 2021-10-23 MED ORDER — ONDANSETRON HCL 4 MG PO TABS: 4.0000 mg | ORAL_TABLET | Freq: Three times a day (TID) | ORAL | 0 refills | Status: DC | PRN

## 2021-10-23 NOTE — Progress Notes (Signed)
Date:  10/23/2021   Name:  Toni Kelly   DOB:  1961/04/18   MRN:  [id number]  This encounter was conducted via video encounter due to the need for social distancing in light of the Covid-19 pandemic.  The patient was correctly identified.  I advised that I am conducting the visit from a secure room in my office at [facility] clinic.  The patient is located at home. The limitations of this form of encounter were discussed with the patient and he/she agreed to proceed.  Some vital signs will be absent.  Chief Complaint: Covid Positive (X1 this morning with home test,symptoms started monday headache, runny nose, sore throat, chills, coughing green mucous, vomiting, fever 100.1)  Covid Positive: Patient test positive for Covid this morning with home test. Patients symptoms started 3 days ago. Patients is experiencing headache, runny nose, sore throat, chills, coughing with green mucous, vomiting, and a of fever 100.1.   Lab Results  Component Value Date   NA 144 01/17/2021   K 4.5 01/17/2021   CO2 25 01/17/2021   GLUCOSE 106 (H) 01/17/2021   BUN 14 01/17/2021   CREATININE 1.05 (H) 01/17/2021   CALCIUM 9.4 01/17/2021   GFRNONAA 57 (L) 01/17/2021   Lab Results  Component Value Date   CHOL 195 01/17/2021   HDL 60 01/17/2021   LDLCALC 112 (H) 01/17/2021   TRIG 130 01/17/2021   CHOLHDL 3.3 01/17/2021   Lab Results  Component Value Date   TSH 1.030 01/17/2021   Lab Results  Component Value Date   HGBA1C 5.8 (H) 01/17/2021   Lab Results  Component Value Date   WBC 5.0 01/17/2021   HGB 14.2 01/17/2021   HCT 43.0 01/17/2021   MCV 90 01/17/2021   PLT 241 01/17/2021   Lab Results  Component Value Date   ALT 69 (H) 01/17/2021   AST 76 (H) 01/17/2021   ALKPHOS 70 01/17/2021   BILITOT 0.5 01/17/2021   No results found for: 25OHVITD2, 25OHVITD3, VD25OH   Review of Systems  Constitutional:  Positive for chills, diaphoresis and fever.  HENT:  Positive for congestion  and rhinorrhea.   Respiratory:  Positive for cough (with green sputum). Negative for chest tightness, shortness of breath and wheezing.   Cardiovascular:  Negative for chest pain.  Gastrointestinal:  Positive for vomiting. Negative for diarrhea.   Patient Active Problem List   Diagnosis Date Noted   S/P total hysterectomy 01/15/2021   Vaginal dysplasia 12/03/2020   Tubular adenoma of colon 02/13/2020   Morbid obesity with BMI of 40.0-44.9, adult ([place]) 08/10/2019   Constipation 12/13/2018   Abnormal EKG 09/14/2018   DDD (degenerative disc disease), lumbar 08/26/2018   Gastroesophageal reflux disease 01/14/2018   HSIL on Pap smear of cervix 05/23/2016   Localized edema 10/31/2015   Elbow tendonitis 10/31/2015   Mixed hyperlipidemia 09/28/2015   Anal fistula 07/21/2013    Allergies  Allergen Reactions   Codeine Itching   Oxycodone Itching   Ultram [Tramadol] Itching    Past Surgical History:  Procedure Laterality Date   ABDOMINAL HYSTERECTOMY  08/2018   abnormal pap   CERVICAL BIOPSY  W/ LOOP ELECTRODE EXCISION  05/2017   [facility]   COLONOSCOPY  2011   @ [facility]   COLONOSCOPY WITH PROPOFOL N/A 02/10/2020   Procedure: COLONOSCOPY WITH BIOPSY;  Surgeon: Jonathon Bellows, MD;  Location: [facility];  Service: Endoscopy;  Laterality: N/A;  priority 4   hemorrhoids banding  2011  POLYPECTOMY N/A 02/10/2020   Procedure: POLYPECTOMY;  Surgeon: Jonathon Bellows, MD;  Location: [facility];  Service: Endoscopy;  Laterality: N/A;   TONSILLECTOMY     VAGINECTOMY, PARTIAL  09/2020   negative for dysplasia or malignancy    Social History   Tobacco Use   Smoking status: Never   Smokeless tobacco: Never  Vaping Use   Vaping Use: Never used  Substance Use Topics   Alcohol use: Yes    Alcohol/week: 0.0 standard drinks    Comment: rarely   Drug use: No     Medication list has been reviewed and updated.  Current Meds  Medication Sig   hydrochlorothiazide (HYDRODIURIL) 25  MG tablet Take 1 tablet (25 mg total) by mouth daily.   Multiple Vitamins-Minerals (WOMENS 50+ MULTI VITAMIN/MIN PO) Take 1 tablet by mouth.   Omega-3 Fatty Acids (FISH OIL PO) Take 1 capsule by mouth daily.    omeprazole (PRILOSEC) 20 MG capsule Take 1 capsule (20 mg total) by mouth 2 (two) times daily before a meal.   simvastatin (ZOCOR) 20 MG tablet Take 1 tablet (20 mg total) by mouth daily.    PHQ 2/9 Scores 10/23/2021 04/08/2021 01/17/2021 04/11/2020  PHQ - 2 Score 0 0 0 0  PHQ- 9 Score 0 0 0 0    GAD 7 : Generalized Anxiety Score 10/23/2021 04/08/2021 01/17/2021 04/11/2020  Nervous, Anxious, on Edge 0 0 0 0  Control/stop worrying 0 0 0 0  Worry too much - different things 0 0 0 0  Trouble relaxing 0 0 0 0  Restless 0 0 0 0  Easily annoyed or irritable 0 0 0 0  Afraid - awful might happen 0 0 0 0  Total GAD 7 Score 0 0 0 0  Anxiety Difficulty - Not difficult at all - Not difficult at all    BP Readings from Last 3 Encounters:  04/08/21 124/78  01/17/21 118/74  12/19/20 (!) 163/84    Physical Exam Constitutional:      Appearance: She is ill-appearing.  Pulmonary:     Effort: Pulmonary effort is normal.  Neurological:     General: No focal deficit present.     Mental Status: She is alert.  Psychiatric:        Mood and Affect: Mood normal.        Behavior: Behavior normal.    Wt Readings from Last 3 Encounters:  04/08/21 262 lb (118.8 kg)  01/17/21 265 lb (120.2 kg)  12/19/20 269 lb 8 oz (122.2 kg)    Temp 100.1 F (37.8 C) (Oral)   Ht 5\' 4"  (1.626 m)   BMI 44.97 kg/m   Assessment and Plan: 1. COVID-19 virus infection Continue Tylenol every 6 hours for fever, headache, body aches Use Zofran as needed for nausea Push fluids, rest and quarantine until 10/26/21. Indications for seeking in-person evaluation discussed. If sputum remains purulent, call for antibiotic therapy. - molnupiravir EUA (LAGEVRIO) 200 mg CAPS capsule; Take 4 capsules (800 mg total) by mouth  2 (two) times daily for 5 days.  Dispense: 40 capsule; Refill: 0 - ondansetron (ZOFRAN) 4 MG tablet; Take 1 tablet (4 mg total) by mouth every 8 (eight) hours as needed for nausea or vomiting.  Dispense: 20 tablet; Refill: 0  I spent 7 minutes on this encounter, 100% of the time via video. Partially dictated using Editor, commissioning. Any errors are unintentional.  Halina Maidens, MD [facility]  10/23/2021

## 2021-10-23 NOTE — Telephone Encounter (Signed)
Patient stated she called yesterday, but didn't hear back from Dr. Army Kelly so she calling back to inform her that she has tested positive for Covid. Patient was scheduled a My Chart vv today @ 11:40

## 2021-10-24 DIAGNOSIS — I10 Essential (primary) hypertension: Secondary | ICD-10-CM | POA: Diagnosis not present

## 2021-10-24 DIAGNOSIS — Z7189 Other specified counseling: Secondary | ICD-10-CM | POA: Diagnosis not present

## 2021-10-28 ENCOUNTER — Telehealth: Payer: Self-pay

## 2021-10-28 ENCOUNTER — Other Ambulatory Visit: Payer: Self-pay | Admitting: Internal Medicine

## 2021-10-28 ENCOUNTER — Encounter: Payer: Self-pay | Admitting: Internal Medicine

## 2021-10-28 DIAGNOSIS — J0100 Acute maxillary sinusitis, unspecified: Secondary | ICD-10-CM

## 2021-10-28 MED ORDER — CEFDINIR 300 MG PO CAPS: 300.0000 mg | ORAL_CAPSULE | Freq: Two times a day (BID) | ORAL | 0 refills | Status: AC

## 2021-10-28 NOTE — Telephone Encounter (Signed)
Pt stated she is still coughing up greens mucous. Asked about a antibiotic.  KP

## 2021-10-28 NOTE — Telephone Encounter (Signed)
Sent pt a Therapist, music.  KP

## 2021-10-28 NOTE — Telephone Encounter (Signed)
Pt calling; had mammogram done Jan 2021; her MyChart says it's not due again for 36yrs; is this right?  She usually has them done every year.  [phone]  Adv pt her mammogram is due 06/17/22 and pap smear due 11/2023.

## 2021-11-01 ENCOUNTER — Other Ambulatory Visit: Payer: Self-pay

## 2021-11-01 ENCOUNTER — Encounter: Payer: Self-pay | Admitting: Internal Medicine

## 2021-11-01 ENCOUNTER — Ambulatory Visit (INDEPENDENT_AMBULATORY_CARE_PROVIDER_SITE_OTHER): Payer: BC Managed Care – PPO | Admitting: Internal Medicine

## 2021-11-01 VITALS — BP 128/102 | HR 89 | Temp 98.2°F | Ht 64.0 in | Wt 265.0 lb

## 2021-11-01 DIAGNOSIS — H6981 Other specified disorders of Eustachian tube, right ear: Secondary | ICD-10-CM

## 2021-11-01 NOTE — Patient Instructions (Addendum)
Coricidin HBP - take as directed for ear pain

## 2021-11-01 NOTE — Progress Notes (Signed)
Date:  11/01/2021   Name:  Toni Kelly   DOB:  May 08, 1961   MRN:  [id number]   Chief Complaint: Ear Pain  Otalgia  There is pain in the right ear. This is a new problem. Episode onset: X1 day. The problem occurs every few minutes. The problem has been unchanged. There has been no fever. The pain is at a severity of 6/10. The pain is moderate. Pertinent negatives include no coughing, diarrhea, ear discharge or headaches. She has tried nothing for the symptoms.  She is on Cefdinir for sinusitis followed Covid.  Has not used anything for the ear pain since it just started.  Lab Results  Component Value Date   NA 144 01/17/2021   K 4.5 01/17/2021   CO2 25 01/17/2021   GLUCOSE 106 (H) 01/17/2021   BUN 14 01/17/2021   CREATININE 1.05 (H) 01/17/2021   CALCIUM 9.4 01/17/2021   GFRNONAA 57 (L) 01/17/2021   Lab Results  Component Value Date   CHOL 195 01/17/2021   HDL 60 01/17/2021   LDLCALC 112 (H) 01/17/2021   TRIG 130 01/17/2021   CHOLHDL 3.3 01/17/2021   Lab Results  Component Value Date   TSH 1.030 01/17/2021   Lab Results  Component Value Date   HGBA1C 5.8 (H) 01/17/2021   Lab Results  Component Value Date   WBC 5.0 01/17/2021   HGB 14.2 01/17/2021   HCT 43.0 01/17/2021   MCV 90 01/17/2021   PLT 241 01/17/2021   Lab Results  Component Value Date   ALT 69 (H) 01/17/2021   AST 76 (H) 01/17/2021   ALKPHOS 70 01/17/2021   BILITOT 0.5 01/17/2021   No results found for: 25OHVITD2, 25OHVITD3, VD25OH   Review of Systems  Constitutional:  Negative for chills, fatigue and fever.  HENT:  Positive for congestion (improving), ear pain and sinus pressure. Negative for ear discharge and trouble swallowing.   Respiratory:  Negative for cough, chest tightness and shortness of breath.   Cardiovascular:  Negative for chest pain and palpitations.  Gastrointestinal:  Negative for diarrhea and nausea.  Neurological:  Negative for dizziness and headaches.   Patient Active  Problem List   Diagnosis Date Noted   S/P total hysterectomy 01/15/2021   Vaginal dysplasia 12/03/2020   Tubular adenoma of colon 02/13/2020   Morbid obesity with BMI of 40.0-44.9, adult ([place]) 08/10/2019   Constipation 12/13/2018   Abnormal EKG 09/14/2018   DDD (degenerative disc disease), lumbar 08/26/2018   Gastroesophageal reflux disease 01/14/2018   HSIL on Pap smear of cervix 05/23/2016   Localized edema 10/31/2015   Elbow tendonitis 10/31/2015   Mixed hyperlipidemia 09/28/2015   Anal fistula 07/21/2013    Allergies  Allergen Reactions   Codeine Itching   Oxycodone Itching   Ultram [Tramadol] Itching    Past Surgical History:  Procedure Laterality Date   ABDOMINAL HYSTERECTOMY  08/2018   abnormal pap   CERVICAL BIOPSY  W/ LOOP ELECTRODE EXCISION  05/2017   [facility]   COLONOSCOPY  2011   @ [facility]   COLONOSCOPY WITH PROPOFOL N/A 02/10/2020   Procedure: COLONOSCOPY WITH BIOPSY;  Surgeon: Jonathon Bellows, MD;  Location: [facility];  Service: Endoscopy;  Laterality: N/A;  priority 4   hemorrhoids banding  2011   POLYPECTOMY N/A 02/10/2020   Procedure: POLYPECTOMY;  Surgeon: Jonathon Bellows, MD;  Location: [facility];  Service: Endoscopy;  Laterality: N/A;   TONSILLECTOMY     VAGINECTOMY, PARTIAL  09/2020  negative for dysplasia or malignancy    Social History   Tobacco Use   Smoking status: Never   Smokeless tobacco: Never  Vaping Use   Vaping Use: Never used  Substance Use Topics   Alcohol use: Yes    Alcohol/week: 0.0 standard drinks    Comment: rarely   Drug use: No     Medication list has been reviewed and updated.  Current Meds  Medication Sig   cefdinir (OMNICEF) 300 MG capsule Take 1 capsule (300 mg total) by mouth 2 (two) times daily for 10 days.   hydrochlorothiazide (HYDRODIURIL) 25 MG tablet Take 1 tablet (25 mg total) by mouth daily.   Multiple Vitamins-Minerals (WOMENS 50+ MULTI VITAMIN/MIN PO) Take 1 tablet by mouth.   Omega-3 Fatty  Acids (FISH OIL PO) Take 1 capsule by mouth daily.    omeprazole (PRILOSEC) 20 MG capsule Take 1 capsule (20 mg total) by mouth 2 (two) times daily before a meal.   ondansetron (ZOFRAN) 4 MG tablet Take 1 tablet (4 mg total) by mouth every 8 (eight) hours as needed for nausea or vomiting.   simvastatin (ZOCOR) 20 MG tablet Take 1 tablet (20 mg total) by mouth daily.    PHQ 2/9 Scores 11/01/2021 10/23/2021 04/08/2021 01/17/2021  PHQ - 2 Score 0 0 0 0  PHQ- 9 Score 0 0 0 0    GAD 7 : Generalized Anxiety Score 11/01/2021 10/23/2021 04/08/2021 01/17/2021  Nervous, Anxious, on Edge 0 0 0 0  Control/stop worrying 0 0 0 0  Worry too much - different things 0 0 0 0  Trouble relaxing 0 0 0 0  Restless 0 0 0 0  Easily annoyed or irritable 0 0 0 0  Afraid - awful might happen 0 0 0 0  Total GAD 7 Score 0 0 0 0  Anxiety Difficulty Not difficult at all - Not difficult at all -    BP Readings from Last 3 Encounters:  11/01/21 (!) 128/102  04/08/21 124/78  01/17/21 118/74    Physical Exam Constitutional:      Appearance: Normal appearance.  HENT:     Right Ear: There is impacted cerumen.     Left Ear: There is impacted cerumen.     Nose:     Right Sinus: No maxillary sinus tenderness.     Left Sinus: No maxillary sinus tenderness.  Cardiovascular:     Rate and Rhythm: Normal rate and regular rhythm.  Pulmonary:     Effort: Pulmonary effort is normal.     Breath sounds: No wheezing or rhonchi.  Musculoskeletal:     Cervical back: Normal range of motion.  Lymphadenopathy:     Cervical: No cervical adenopathy.  Neurological:     Mental Status: She is alert.    Wt Readings from Last 3 Encounters:  11/01/21 265 lb (120.2 kg)  04/08/21 262 lb (118.8 kg)  01/17/21 265 lb (120.2 kg)    BP (!) 128/102   Pulse 89   Temp 98.2 F (36.8 C) (Oral)   Ht 5\' 4"  (1.626 m)   Wt 265 lb (120.2 kg)   SpO2 95%   BMI 45.49 kg/m   Assessment and Plan: 1. Eustachian tube dysfunction,  right Secondary to URI/recent Covid. Finish all antibiotics Take Coricidin HBP for congestion Use warm compresses  I deferred ear lavage as unsuccessful lavage may exacerbate her current mild sx Return in the near future if ears feel full for lavage   Partially dictated using  Editor, commissioning. Any errors are unintentional.  Halina Maidens, MD [facility]  11/01/2021

## 2021-11-24 DIAGNOSIS — I10 Essential (primary) hypertension: Secondary | ICD-10-CM | POA: Diagnosis not present

## 2021-11-26 ENCOUNTER — Telehealth: Payer: Self-pay | Admitting: Internal Medicine

## 2021-11-26 ENCOUNTER — Other Ambulatory Visit: Payer: Self-pay

## 2021-11-26 DIAGNOSIS — U071 COVID-19: Secondary | ICD-10-CM

## 2021-11-26 DIAGNOSIS — K219 Gastro-esophageal reflux disease without esophagitis: Secondary | ICD-10-CM

## 2021-11-26 DIAGNOSIS — E782 Mixed hyperlipidemia: Secondary | ICD-10-CM

## 2021-11-26 MED ORDER — ONDANSETRON HCL 4 MG PO TABS: 4.0000 mg | ORAL_TABLET | Freq: Three times a day (TID) | ORAL | 0 refills | Status: DC | PRN

## 2021-11-26 MED ORDER — SIMVASTATIN 20 MG PO TABS: 20.0000 mg | ORAL_TABLET | Freq: Every day | ORAL | 0 refills | Status: DC

## 2021-11-26 MED ORDER — OMEPRAZOLE 20 MG PO CPDR: 20.0000 mg | DELAYED_RELEASE_CAPSULE | Freq: Two times a day (BID) | ORAL | 0 refills | Status: DC

## 2021-11-26 NOTE — Telephone Encounter (Signed)
Patient called in , requests all her meds be sent to [facility] order, [address] [place], [place] [address].

## 2021-11-26 NOTE — Telephone Encounter (Signed)
Refill sent in.  KP 

## 2021-12-25 DIAGNOSIS — I10 Essential (primary) hypertension: Secondary | ICD-10-CM | POA: Diagnosis not present

## 2021-12-27 ENCOUNTER — Other Ambulatory Visit: Payer: Self-pay | Admitting: Internal Medicine

## 2021-12-27 DIAGNOSIS — R600 Localized edema: Secondary | ICD-10-CM

## 2021-12-27 NOTE — Telephone Encounter (Signed)
Requested medication (s) are due for refill today: yes  Requested medication (s) are on the active medication list: yes  Last refill:  01/17/21 #90/3  Future visit scheduled: yes  Notes to clinic:  Unable to refill per protocol due to failed labs, no updated results.      Requested Prescriptions  Pending Prescriptions Disp Refills   hydrochlorothiazide (HYDRODIURIL) 25 MG tablet [Pharmacy Med Name: Hydrochlorothiazide 25mg  Tablet] 90 tablet 3    Sig: Take 1 tablet by mouth daily.     Cardiovascular: Diuretics - Thiazide Failed - 12/27/2021  3:46 PM      Failed - Cr in normal range and within 180 days    Creatinine, Ser  Date Value Ref Range Status  01/17/2021 1.05 (H) 0.57 - 1.00 mg/dL Final    Comment:                   **Effective January 21, 2021 [facility] will begin**                  reporting the 2021 CKD-EPI creatinine equation that                  estimates kidney function without a race variable.           Failed - K in normal range and within 180 days    Potassium  Date Value Ref Range Status  01/17/2021 4.5 3.5 - 5.2 mmol/L Final          Failed - Na in normal range and within 180 days    Sodium  Date Value Ref Range Status  01/17/2021 144 134 - 144 mmol/L Final          Failed - Last BP in normal range    BP Readings from Last 1 Encounters:  11/01/21 (!) 128/102          Passed - Valid encounter within last 6 months    Recent Outpatient Visits           1 month ago Eustachian tube dysfunction, right   [facility] Glean Hess, MD   2 months ago COVID-19 virus infection   [place], [place]   8 months ago Acute non-recurrent maxillary sinusitis   [facility] Glean Hess, MD   11 months ago Annual physical exam   [facility] Glean Hess, MD   1 year ago [facility] Glean Hess, MD       Future Appointments             In 3 weeks Army Melia  Jesse Sans, MD [facility], [facility]

## 2022-01-21 ENCOUNTER — Encounter: Payer: Self-pay | Admitting: Internal Medicine

## 2022-01-21 ENCOUNTER — Other Ambulatory Visit: Payer: Self-pay

## 2022-01-21 ENCOUNTER — Ambulatory Visit (INDEPENDENT_AMBULATORY_CARE_PROVIDER_SITE_OTHER): Payer: BC Managed Care – PPO | Admitting: Internal Medicine

## 2022-01-21 VITALS — BP 124/70 | HR 91 | Ht 64.0 in | Wt 264.0 lb

## 2022-01-21 DIAGNOSIS — Z1231 Encounter for screening mammogram for malignant neoplasm of breast: Secondary | ICD-10-CM

## 2022-01-21 DIAGNOSIS — R600 Localized edema: Secondary | ICD-10-CM | POA: Diagnosis not present

## 2022-01-21 DIAGNOSIS — Z6841 Body Mass Index (BMI) 40.0 and over, adult: Secondary | ICD-10-CM

## 2022-01-21 DIAGNOSIS — Z0000 Encounter for general adult medical examination without abnormal findings: Secondary | ICD-10-CM

## 2022-01-21 DIAGNOSIS — E782 Mixed hyperlipidemia: Secondary | ICD-10-CM

## 2022-01-21 DIAGNOSIS — K219 Gastro-esophageal reflux disease without esophagitis: Secondary | ICD-10-CM

## 2022-01-21 DIAGNOSIS — K5909 Other constipation: Secondary | ICD-10-CM

## 2022-01-21 DIAGNOSIS — N893 Dysplasia of vagina, unspecified: Secondary | ICD-10-CM

## 2022-01-21 LAB — POCT URINALYSIS DIPSTICK
Bilirubin, UA: NEGATIVE
Blood, UA: NEGATIVE
Glucose, UA: NEGATIVE
Ketones, UA: NEGATIVE
Leukocytes, UA: NEGATIVE
Nitrite, UA: NEGATIVE
Protein, UA: POSITIVE — AB
Spec Grav, UA: 1.01 (ref 1.010–1.025)
Urobilinogen, UA: 0.2 E.U./dL
pH, UA: 6.5 (ref 5.0–8.0)

## 2022-01-21 MED ORDER — FAMOTIDINE 40 MG PO TABS: 40.0000 mg | ORAL_TABLET | Freq: Every day | ORAL | 0 refills | Status: DC

## 2022-01-21 NOTE — Progress Notes (Signed)
Date:  01/21/2022   Name:  Toni Kelly   DOB:  08/06/1961   MRN:  [id number]   Chief Complaint: Annual Exam (GYN- Dr. Star Age. ) Toni Kelly is a 61 y.o. female who presents today for her Complete Annual Exam. She feels well. She reports exercising - none. She reports she is sleeping well. Breast complaints none.  Mammogram: 05/2021 DEXA: none Pap smear: 11/2020 neg with co-testing ([facility]) Colonoscopy: 01/2020 repeat 7 yrs  Immunization History  Administered Date(s) Administered   Influenza,inj,Quad PF,6+ Mos 08/08/2018, 08/10/2020   Influenza,inj,quad, With Preservative 07/26/2019   Influenza-Unspecified 09/09/2015, 09/07/2017, 07/25/2021   PFIZER Comirnaty(Gray Top)Covid-19 Tri-Sucrose Vaccine 05/11/2021   PFIZER(Purple Top)SARS-COV-2 Vaccination 02/03/2020, 02/28/2020, 08/25/2020, 08/10/2021   Pneumococcal Polysaccharide-23 07/26/2019   Zoster Recombinat (Shingrix) 03/21/2019, 07/29/2019    Gastroesophageal Reflux She complains of a hoarse voice. She reports no abdominal pain, no chest pain, no coughing, no heartburn or no wheezing. This is a recurrent problem. The problem occurs occasionally. Pertinent negatives include no fatigue. She has tried a PPI for the symptoms.  Hyperlipidemia This is a chronic problem. The problem is controlled. Pertinent negatives include no chest pain or shortness of breath. Current antihyperlipidemic treatment includes statins. The current treatment provides significant improvement of lipids.   Lab Results  Component Value Date   NA 144 01/17/2021   K 4.5 01/17/2021   CO2 25 01/17/2021   GLUCOSE 106 (H) 01/17/2021   BUN 14 01/17/2021   CREATININE 1.05 (H) 01/17/2021   CALCIUM 9.4 01/17/2021   GFRNONAA 57 (L) 01/17/2021   Lab Results  Component Value Date   CHOL 195 01/17/2021   HDL 60 01/17/2021   LDLCALC 112 (H) 01/17/2021   TRIG 130 01/17/2021   CHOLHDL 3.3 01/17/2021   Lab Results  Component Value Date   TSH 1.030  01/17/2021   Lab Results  Component Value Date   HGBA1C 5.8 (H) 01/17/2021   Lab Results  Component Value Date   WBC 5.0 01/17/2021   HGB 14.2 01/17/2021   HCT 43.0 01/17/2021   MCV 90 01/17/2021   PLT 241 01/17/2021   Lab Results  Component Value Date   ALT 69 (H) 01/17/2021   AST 76 (H) 01/17/2021   ALKPHOS 70 01/17/2021   BILITOT 0.5 01/17/2021   No results found for: 25OHVITD2, 25OHVITD3, VD25OH   Review of Systems  Constitutional:  Negative for chills, fatigue and fever.  HENT:  Positive for hoarse voice. Negative for congestion, hearing loss, tinnitus, trouble swallowing and voice change.   Eyes:  Negative for visual disturbance.  Respiratory:  Negative for cough, chest tightness, shortness of breath and wheezing.   Cardiovascular:  Negative for chest pain, palpitations and leg swelling.  Gastrointestinal:  Negative for abdominal pain, constipation, diarrhea, heartburn and vomiting.  Endocrine: Negative for polydipsia and polyuria.  Genitourinary:  Negative for dysuria, frequency, genital sores, vaginal bleeding and vaginal discharge.  Musculoskeletal:  Negative for arthralgias, gait problem and joint swelling.  Skin:  Negative for color change and rash.  Neurological:  Negative for dizziness, tremors, light-headedness and headaches.  Hematological:  Negative for adenopathy. Does not bruise/bleed easily.  Psychiatric/Behavioral:  Negative for dysphoric mood and sleep disturbance. The patient is not nervous/anxious.    Patient Active Problem List   Diagnosis Date Noted   S/P total hysterectomy 01/15/2021   Vaginal dysplasia 12/03/2020   Tubular adenoma of colon 02/13/2020   Morbid obesity with BMI of 40.0-44.9, adult ([place]) 08/10/2019  Constipation 12/13/2018   Abnormal EKG 09/14/2018   DDD (degenerative disc disease), lumbar 08/26/2018   Gastroesophageal reflux disease 01/14/2018   HSIL on Pap smear of cervix 05/23/2016   Localized edema 10/31/2015   Elbow  tendonitis 10/31/2015   Mixed hyperlipidemia 09/28/2015   Anal fistula 07/21/2013    Allergies  Allergen Reactions   Codeine Itching   Oxycodone Itching   Ultram [Tramadol] Itching    Past Surgical History:  Procedure Laterality Date   ABDOMINAL HYSTERECTOMY  08/2018   abnormal pap   CERVICAL BIOPSY  W/ LOOP ELECTRODE EXCISION  05/2017   [facility]   COLONOSCOPY  2011   @ [facility]   COLONOSCOPY WITH PROPOFOL N/A 02/10/2020   Procedure: COLONOSCOPY WITH BIOPSY;  Surgeon: Jonathon Bellows, MD;  Location: [facility];  Service: Endoscopy;  Laterality: N/A;  priority 4   hemorrhoids banding  2011   POLYPECTOMY N/A 02/10/2020   Procedure: POLYPECTOMY;  Surgeon: Jonathon Bellows, MD;  Location: [facility];  Service: Endoscopy;  Laterality: N/A;   TONSILLECTOMY     VAGINECTOMY, PARTIAL  09/2020   negative for dysplasia or malignancy    Social History   Tobacco Use   Smoking status: Never   Smokeless tobacco: Never  Vaping Use   Vaping Use: Never used  Substance Use Topics   Alcohol use: Yes    Alcohol/week: 0.0 standard drinks    Comment: rarely   Drug use: No     Medication list has been reviewed and updated.  Current Meds  Medication Sig   hydrochlorothiazide (HYDRODIURIL) 25 MG tablet Take 1 tablet by mouth daily.   Multiple Vitamins-Minerals (WOMENS 50+ MULTI VITAMIN/MIN PO) Take 1 tablet by mouth.   Omega-3 Fatty Acids (FISH OIL PO) Take 1 capsule by mouth daily.    omeprazole (PRILOSEC) 20 MG capsule Take 1 capsule (20 mg total) by mouth 2 (two) times daily before a meal.   ondansetron (ZOFRAN) 4 MG tablet Take 1 tablet (4 mg total) by mouth every 8 (eight) hours as needed for nausea or vomiting.   simvastatin (ZOCOR) 20 MG tablet Take 1 tablet (20 mg total) by mouth daily.    PHQ 2/9 Scores 01/21/2022 11/01/2021 10/23/2021 04/08/2021  PHQ - 2 Score 0 0 0 0  PHQ- 9 Score 0 0 0 0    GAD 7 : Generalized Anxiety Score 01/21/2022 11/01/2021 10/23/2021 04/08/2021   Nervous, Anxious, on Edge 0 0 0 0  Control/stop worrying 0 0 0 0  Worry too much - different things 0 0 0 0  Trouble relaxing 0 0 0 0  Restless 0 0 0 0  Easily annoyed or irritable 0 0 0 0  Afraid - awful might happen 0 0 0 0  Total GAD 7 Score 0 0 0 0  Anxiety Difficulty Not difficult at all Not difficult at all - Not difficult at all    BP Readings from Last 3 Encounters:  01/21/22 124/70  11/01/21 (!) 128/102  04/08/21 124/78    Physical Exam Vitals and nursing note reviewed.  Constitutional:      General: She is not in acute distress.    Appearance: She is well-developed.  HENT:     Head: Normocephalic and atraumatic.     Right Ear: Tympanic membrane and ear canal normal.     Left Ear: Tympanic membrane and ear canal normal.     Nose:     Right Sinus: No maxillary sinus tenderness.     Left  Sinus: No maxillary sinus tenderness.  Eyes:     General: No scleral icterus.       Right eye: No discharge.        Left eye: No discharge.     Conjunctiva/sclera: Conjunctivae normal.  Neck:     Thyroid: No thyromegaly.     Vascular: No carotid bruit.  Cardiovascular:     Rate and Rhythm: Normal rate and regular rhythm.     Pulses: Normal pulses.     Heart sounds: Normal heart sounds.  Pulmonary:     Effort: Pulmonary effort is normal. No respiratory distress.     Breath sounds: No wheezing.  Abdominal:     General: Bowel sounds are normal.     Palpations: Abdomen is soft.     Tenderness: There is no abdominal tenderness.  Musculoskeletal:     Cervical back: Normal range of motion. No erythema.     Right lower leg: No edema.     Left lower leg: No edema.  Lymphadenopathy:     Cervical: No cervical adenopathy.  Skin:    General: Skin is warm and dry.     Findings: No rash.  Neurological:     Mental Status: She is alert and oriented to person, place, and time.     Cranial Nerves: No cranial nerve deficit.     Sensory: No sensory deficit.     Deep Tendon Reflexes:  Reflexes are normal and symmetric.  Psychiatric:        Attention and Perception: Attention normal.        Mood and Affect: Mood normal.    Wt Readings from Last 3 Encounters:  01/21/22 264 lb (119.7 kg)  11/01/21 265 lb (120.2 kg)  04/08/21 262 lb (118.8 kg)    BP 124/70    Pulse 91    Ht 5\' 4"  (1.626 m)    Wt 264 lb (119.7 kg)    SpO2 95%    BMI 45.32 kg/m   Assessment and Plan: 1. Annual physical exam Exam is normal except for weight. Encourage regular exercise and appropriate dietary changes. Up to date on screenings and immunizations. - Hemoglobin A1c - POCT urinalysis dipstick  2. Encounter for screening mammogram for breast cancer Schedule in July. - MM 3D SCREEN BREAST BILATERAL  3. Gastroesophageal reflux disease without esophagitis Symptoms well controlled on daily PPI with only occasional AM persistence. No red flag signs such as weight loss, n/v, melena Will continue omeprazole bid and add PRN Pepcid - CBC with Differential/Platelet - famotidine (PEPCID) 40 MG tablet; Take 1 tablet (40 mg total) by mouth daily.  Dispense: 90 tablet; Refill: 0  4. Mixed hyperlipidemia Check labs and advise - Lipid panel  5. Localized edema Controlled with HCTZ - Comprehensive metabolic panel - TSH  6. BMI 45.0-49.9, adult (HCC) Continue healthy diet and weight loss efforts  7. Vaginal dysplasia Followed by GYN - Dr. Georgianne Fick; she will need to establish with another GYN for ongoing monitoring   Partially dictated using Dragon software. Any errors are unintentional.  Halina Maidens, MD [facility]  01/21/2022

## 2022-01-22 ENCOUNTER — Telehealth: Payer: Self-pay | Admitting: *Deleted

## 2022-01-22 ENCOUNTER — Other Ambulatory Visit: Payer: Self-pay

## 2022-01-22 ENCOUNTER — Other Ambulatory Visit: Payer: Self-pay | Admitting: Internal Medicine

## 2022-01-22 DIAGNOSIS — E782 Mixed hyperlipidemia: Secondary | ICD-10-CM

## 2022-01-22 DIAGNOSIS — R600 Localized edema: Secondary | ICD-10-CM

## 2022-01-22 LAB — CBC WITH DIFFERENTIAL/PLATELET
Basophils Absolute: 0.1 10*3/uL (ref 0.0–0.2)
Basos: 1 %
EOS (ABSOLUTE): 0.2 10*3/uL (ref 0.0–0.4)
Eos: 3 %
Hematocrit: 45.9 % (ref 34.0–46.6)
Hemoglobin: 15.2 g/dL (ref 11.1–15.9)
Immature Grans (Abs): 0 10*3/uL (ref 0.0–0.1)
Immature Granulocytes: 0 %
Lymphocytes Absolute: 2.3 10*3/uL (ref 0.7–3.1)
Lymphs: 30 %
MCH: 29.2 pg (ref 26.6–33.0)
MCHC: 33.1 g/dL (ref 31.5–35.7)
MCV: 88 fL (ref 79–97)
Monocytes Absolute: 0.6 10*3/uL (ref 0.1–0.9)
Monocytes: 8 %
Neutrophils Absolute: 4.6 10*3/uL (ref 1.4–7.0)
Neutrophils: 58 %
Platelets: 309 10*3/uL (ref 150–450)
RBC: 5.21 x10E6/uL (ref 3.77–5.28)
RDW: 13.1 % (ref 11.7–15.4)
WBC: 7.8 10*3/uL (ref 3.4–10.8)

## 2022-01-22 LAB — COMPREHENSIVE METABOLIC PANEL
ALT: 53 IU/L — ABNORMAL HIGH (ref 0–32)
AST: 54 IU/L — ABNORMAL HIGH (ref 0–40)
Albumin/Globulin Ratio: 1.3 (ref 1.2–2.2)
Albumin: 4.2 g/dL (ref 3.8–4.8)
Alkaline Phosphatase: 80 IU/L (ref 44–121)
BUN/Creatinine Ratio: 12 (ref 12–28)
BUN: 13 mg/dL (ref 8–27)
Bilirubin Total: 0.5 mg/dL (ref 0.0–1.2)
CO2: 22 mmol/L (ref 20–29)
Calcium: 9.8 mg/dL (ref 8.7–10.3)
Chloride: 103 mmol/L (ref 96–106)
Creatinine, Ser: 1.07 mg/dL — ABNORMAL HIGH (ref 0.57–1.00)
Globulin, Total: 3.2 g/dL (ref 1.5–4.5)
Glucose: 120 mg/dL — ABNORMAL HIGH (ref 70–99)
Potassium: 4.6 mmol/L (ref 3.5–5.2)
Sodium: 143 mmol/L (ref 134–144)
Total Protein: 7.4 g/dL (ref 6.0–8.5)
eGFR: 59 mL/min/{1.73_m2} — ABNORMAL LOW (ref >59)

## 2022-01-22 LAB — LIPID PANEL
Chol/HDL Ratio: 3.1 ratio (ref 0.0–4.4)
Cholesterol, Total: 189 mg/dL (ref 100–199)
HDL: 61 mg/dL (ref >39)
LDL Chol Calc (NIH): 102 mg/dL — ABNORMAL HIGH (ref 0–99)
Triglycerides: 149 mg/dL (ref 0–149)
VLDL Cholesterol Cal: 26 mg/dL (ref 5–40)

## 2022-01-22 LAB — TSH: TSH: 1.22 u[IU]/mL (ref 0.450–4.500)

## 2022-01-22 LAB — HEMOGLOBIN A1C
Est. average glucose Bld gHb Est-mCnc: 123 mg/dL
Hgb A1c MFr Bld: 5.9 % — ABNORMAL HIGH (ref 4.8–5.6)

## 2022-01-22 NOTE — Telephone Encounter (Signed)
Medication Refill - Medication: hydrochlorothiazide (HYDRODIURIL) 25 MG tablet  ?simvastatin (ZOCOR) 20 MG tablet  ?Has the patient contacted their pharmacy? Yes.   ?(Agent: If no, request that the patient contact the pharmacy for the refill. If patient does not wish to contact the pharmacy document the reason why and proceed with request.) ?(Agent: If yes, when and what did the pharmacy advise?) ? ?Preferred Pharmacy (with phone number or street name):  ?[place] [address] [place] [address]  ?Phone: [phone] Fax: [phone]  ? ?Has the patient been seen for an appointment in the last year OR does the patient have an upcoming appointment? Yes.   ? ?Agent: Please be advised that RX refills may take up to 3 business days. We ask that you follow-up with your pharmacy. ? ?

## 2022-01-22 NOTE — Telephone Encounter (Signed)
'  Toni Kelly' from [facility] calling for clarification. ? ?Questioning if pt is on both Omeprazole and Famotidine. Noted last OV Pepcid was added PRN.  Note also reads '1 tab.by mouth daily' in both note and orders. Please review. Thank you ? ?[employer] [phone] ?Ph# [phone] ?

## 2022-01-22 NOTE — Telephone Encounter (Signed)
Called let them know pt is taking pepcid daily. ? ?KP ?

## 2022-01-23 MED ORDER — SIMVASTATIN 20 MG PO TABS: 20.0000 mg | ORAL_TABLET | Freq: Every day | ORAL | 0 refills | Status: DC

## 2022-01-23 MED ORDER — HYDROCHLOROTHIAZIDE 25 MG PO TABS: 25.0000 mg | ORAL_TABLET | Freq: Every day | ORAL | 0 refills | Status: DC

## 2022-01-23 NOTE — Telephone Encounter (Signed)
Requested Prescriptions  ?Pending Prescriptions Disp Refills  ?? hydrochlorothiazide (HYDRODIURIL) 25 MG tablet 90 tablet 0  ?  Sig: Take 1 tablet (25 mg total) by mouth daily.  ?  ? Cardiovascular: Diuretics - Thiazide Failed - 01/22/2022  5:56 PM  ?  ?  Failed - Cr in normal range and within 180 days  ?  Creatinine, Ser  ?Date Value Ref Range Status  ?01/21/2022 1.07 (H) 0.57 - 1.00 mg/dL Final  ?   ?  ?  Passed - K in normal range and within 180 days  ?  Potassium  ?Date Value Ref Range Status  ?01/21/2022 4.6 3.5 - 5.2 mmol/L Final  ?   ?  ?  Passed - Na in normal range and within 180 days  ?  Sodium  ?Date Value Ref Range Status  ?01/21/2022 143 134 - 144 mmol/L Final  ?   ?  ?  Passed - Last BP in normal range  ?  BP Readings from Last 1 Encounters:  ?01/21/22 124/70  ?   ?  ?  Passed - Valid encounter within last 6 months  ?  Recent Outpatient Visits   ?      ? 2 days ago Annual physical exam  ? [facility] Glean Hess, MD  ? 2 months ago Eustachian tube dysfunction, right  ? [facility] Glean Hess, MD  ? 3 months ago COVID-19 virus infection  ? [facility] Glean Hess, MD  ? 9 months ago Acute non-recurrent maxillary sinusitis  ? [facility] Glean Hess, MD  ? 1 year ago Annual physical exam  ? [facility] Glean Hess, MD  ?  ?  ?Future Appointments   ?        ? In 1 year Glean Hess, MD [facility], [facility]  ?  ? ?  ?  ?  ?? simvastatin (ZOCOR) 20 MG tablet 90 tablet 0  ?  Sig: Take 1 tablet (20 mg total) by mouth daily.  ?  ? Cardiovascular:  Antilipid - Statins Failed - 01/22/2022  5:56 PM  ?  ?  Failed - Lipid Panel in normal range within the last 12 months  ?  Cholesterol, Total  ?Date Value Ref Range Status  ?01/21/2022 189 100 - 199 mg/dL Final  ? ?LDL Chol Calc (NIH)  ?Date Value Ref Range Status  ?01/21/2022 102 (H) 0 - 99 mg/dL Final  ? ?HDL  ?Date Value Ref Range Status  ?01/21/2022 61 >39 mg/dL Final   ? ?Triglycerides  ?Date Value Ref Range Status  ?01/21/2022 149 0 - 149 mg/dL Final  ? ?  ?  ?  Passed - Patient is not pregnant  ?  ?  Passed - Valid encounter within last 12 months  ?  Recent Outpatient Visits   ?      ? 2 days ago Annual physical exam  ? [facility] Glean Hess, MD  ? 2 months ago Eustachian tube dysfunction, right  ? [facility] Glean Hess, MD  ? 3 months ago COVID-19 virus infection  ? [facility] Glean Hess, MD  ? 9 months ago Acute non-recurrent maxillary sinusitis  ? [facility] Glean Hess, MD  ? 1 year ago Annual physical exam  ? [facility] Glean Hess, MD  ?  ?  ?Future Appointments   ?        ?  In 1 year Army Melia, Jesse Sans, MD [facility], [place]  ?  ? ?  ?  ?  ? ?

## 2022-01-24 DIAGNOSIS — I10 Essential (primary) hypertension: Secondary | ICD-10-CM | POA: Diagnosis not present

## 2022-01-31 ENCOUNTER — Encounter: Payer: Self-pay | Admitting: Podiatry

## 2022-01-31 ENCOUNTER — Other Ambulatory Visit: Payer: Self-pay

## 2022-01-31 ENCOUNTER — Ambulatory Visit (INDEPENDENT_AMBULATORY_CARE_PROVIDER_SITE_OTHER): Payer: BC Managed Care – PPO | Admitting: Podiatry

## 2022-01-31 DIAGNOSIS — L600 Ingrowing nail: Secondary | ICD-10-CM | POA: Diagnosis not present

## 2022-01-31 MED ORDER — GENTAMICIN SULFATE 0.1 % EX CREA: 1.0000 "application " | TOPICAL_CREAM | Freq: Two times a day (BID) | CUTANEOUS | 1 refills | Status: DC

## 2022-01-31 NOTE — Progress Notes (Signed)
? ?  Subjective: ?Patient presents today for evaluation of pain to the medial border right great toe. Patient is concerned for possible ingrown nail.  It is very sensitive to touch.  Patient presents today for further treatment and evaluation. ? ?Past Medical History:  ?Diagnosis Date  ? Anal fistula 07/21/2013  ? Arthritis   ? lower back  ? Bursitis   ? Elbow tendonitis 10/31/2015  ? Esophageal reflux   ? Hemorrhoids 2011  ? HPV (human papilloma virus) infection   ? HSIL on Pap smear of cervix 05/23/2016  ? Hyperlipidemia   ? Hypertension   ? ? ?Objective:  ?General: Well developed, nourished, in no acute distress, alert and oriented x3  ? ?Dermatology: Skin is warm, dry and supple bilateral.  Medial border right great toe appears to be erythematous with evidence of an ingrowing nail. Pain on palpation noted to the border of the nail fold. The remaining nails appear unremarkable at this time. There are no open sores, lesions. ? ?Vascular: Dorsalis Pedis artery and Posterior Tibial artery pedal pulses palpable. No lower extremity edema noted.  ? ?Neruologic: Grossly intact via light touch bilateral. ? ?Musculoskeletal: Muscular strength within normal limits in all groups bilateral. Normal range of motion noted to all pedal and ankle joints.  ? ?Assesement: ?#1 Paronychia with ingrowing nail medial border right great toe ?#2 Pain in toe ? ?Plan of Care:  ?1. Patient evaluated.  ?2. Discussed treatment alternatives and plan of care. Explained nail avulsion procedure and post procedure course to patient. ?3. Patient opted for permanent partial nail avulsion of the ingrown portion of the nail.  ?4. Prior to procedure, local anesthesia infiltration utilized using 3 ml of a 50:50 mixture of 2% plain lidocaine and 0.5% plain marcaine in a normal hallux block fashion and a betadine prep performed.  ?5. Partial permanent nail avulsion with chemical matrixectomy performed using [id number] applications of phenol followed by alcohol  flush.  ?6. Light dressing applied.  Post care instructions provided ?7.  Prescription for gentamicin 2% cream  ?8.  Return to clinic 2 weeks. ? ?Edrick Kins, DPM ?[facility] ? ?Dr. Edrick Kins, DPM  ?  ?[address]                                       ?[place], [place] [address]                ?Office [phone]  ?Fax [phone] ? ? ? ? ?

## 2022-01-31 NOTE — Patient Instructions (Signed)

## 2022-02-14 ENCOUNTER — Ambulatory Visit (INDEPENDENT_AMBULATORY_CARE_PROVIDER_SITE_OTHER): Payer: BC Managed Care – PPO | Admitting: Podiatry

## 2022-02-14 ENCOUNTER — Other Ambulatory Visit: Payer: Self-pay

## 2022-02-14 ENCOUNTER — Encounter: Payer: Self-pay | Admitting: Podiatry

## 2022-02-14 DIAGNOSIS — L600 Ingrowing nail: Secondary | ICD-10-CM

## 2022-02-14 NOTE — Progress Notes (Signed)
? ?  Subjective: ?61 y.o. female presents today status post permanent nail avulsion procedure of the medial border right great toe that was performed on 01/31/2022.  Patient states that she feels well.  No pain.  She is doing well and did the post care instructions as provided.  ? ?Past Medical History:  ?Diagnosis Date  ? Anal fistula 07/21/2013  ? Arthritis   ? lower back  ? Bursitis   ? Elbow tendonitis 10/31/2015  ? Esophageal reflux   ? Hemorrhoids 2011  ? HPV (human papilloma virus) infection   ? HSIL on Pap smear of cervix 05/23/2016  ? Hyperlipidemia   ? Hypertension   ? ? ?Objective: ?Skin is warm, dry and supple. Nail and respective nail fold appears to be healing appropriately. Open wound to the associated nail fold with a granular wound base and moderate amount of fibrotic tissue. Minimal drainage noted. Mild erythema around the periungual region likely due to phenol chemical matricectomy. ? ?Assessment: ?#1 s/p partial permanent nail matrixectomy medial border right great toe ? ? ?Plan of care: ?#1 patient was evaluated  ?#2 light debridement of open wound was performed to the periungual border of the respective toe using a currette. Antibiotic ointment and Band-Aid was applied. ?#3 patient is to return to clinic on a PRN basis. ? ? ?Edrick Kins, DPM ?[facility] ? ?Dr. Edrick Kins, DPM  ?  ?[address]                                      ?[place], [place] [address]                ?Office [phone]  ?Fax [phone] ? ? ? ? ?

## 2022-02-23 DIAGNOSIS — I10 Essential (primary) hypertension: Secondary | ICD-10-CM | POA: Diagnosis not present

## 2022-02-27 ENCOUNTER — Encounter: Payer: Self-pay | Admitting: Internal Medicine

## 2022-02-27 ENCOUNTER — Other Ambulatory Visit: Payer: Self-pay

## 2022-02-27 DIAGNOSIS — K219 Gastro-esophageal reflux disease without esophagitis: Secondary | ICD-10-CM

## 2022-02-27 MED ORDER — OMEPRAZOLE 20 MG PO CPDR: 20.0000 mg | DELAYED_RELEASE_CAPSULE | Freq: Two times a day (BID) | ORAL | 0 refills | Status: DC

## 2022-03-06 ENCOUNTER — Encounter: Payer: Self-pay | Admitting: Internal Medicine

## 2022-03-07 ENCOUNTER — Other Ambulatory Visit: Payer: Self-pay | Admitting: Internal Medicine

## 2022-03-07 DIAGNOSIS — J3089 Other allergic rhinitis: Secondary | ICD-10-CM | POA: Insufficient documentation

## 2022-03-07 MED ORDER — LEVOCETIRIZINE DIHYDROCHLORIDE 5 MG PO TABS: 5.0000 mg | ORAL_TABLET | Freq: Every evening | ORAL | 0 refills | Status: DC

## 2022-03-13 DIAGNOSIS — Z1239 Encounter for other screening for malignant neoplasm of breast: Secondary | ICD-10-CM | POA: Diagnosis not present

## 2022-03-13 DIAGNOSIS — R87613 High grade squamous intraepithelial lesion on cytologic smear of cervix (HGSIL): Secondary | ICD-10-CM | POA: Diagnosis not present

## 2022-03-13 DIAGNOSIS — Z1231 Encounter for screening mammogram for malignant neoplasm of breast: Secondary | ICD-10-CM | POA: Diagnosis not present

## 2022-03-13 DIAGNOSIS — Z01411 Encounter for gynecological examination (general) (routine) with abnormal findings: Secondary | ICD-10-CM | POA: Diagnosis not present

## 2022-03-25 DIAGNOSIS — I10 Essential (primary) hypertension: Secondary | ICD-10-CM | POA: Diagnosis not present

## 2022-04-02 ENCOUNTER — Other Ambulatory Visit: Payer: Self-pay | Admitting: Internal Medicine

## 2022-04-02 DIAGNOSIS — K219 Gastro-esophageal reflux disease without esophagitis: Secondary | ICD-10-CM

## 2022-04-03 NOTE — Telephone Encounter (Signed)
D/C'd 02/27/22, change in therapy. Not on current med profile ?Requested Prescriptions  ?Pending Prescriptions Disp Refills  ?? famotidine (PEPCID) 40 MG tablet [Pharmacy Med Name: Famotidine '40mg'$  Tablet] 90 tablet 0  ?  Sig: Take 1 tablet by mouth daily.  ?  ? Gastroenterology:  H2 Antagonists Passed - 04/02/2022  4:18 PM  ?  ?  Passed - Valid encounter within last 12 months  ?  Recent Outpatient Visits   ?      ? 2 months ago Annual physical exam  ? [facility] Glean Hess, MD  ? 5 months ago Eustachian tube dysfunction, right  ? [facility] Glean Hess, MD  ? 5 months ago COVID-19 virus infection  ? [facility] Glean Hess, MD  ? 12 months ago Acute non-recurrent maxillary sinusitis  ? [facility] Glean Hess, MD  ? 1 year ago Annual physical exam  ? [facility] Glean Hess, MD  ?  ?  ?Future Appointments   ?        ? In 9 months Glean Hess, MD [facility], [place]  ?  ? ?  ?  ?  ? ? ?

## 2022-04-24 DIAGNOSIS — I10 Essential (primary) hypertension: Secondary | ICD-10-CM | POA: Diagnosis not present

## 2022-05-08 ENCOUNTER — Other Ambulatory Visit: Payer: Self-pay | Admitting: Internal Medicine

## 2022-05-08 DIAGNOSIS — K219 Gastro-esophageal reflux disease without esophagitis: Secondary | ICD-10-CM

## 2022-05-08 NOTE — Telephone Encounter (Signed)
Requested Prescriptions  Pending Prescriptions Disp Refills  . omeprazole (PRILOSEC) 20 MG capsule [Pharmacy Med Name: Omeprazole '20mg'$  Delayed-Release Capsule] 180 capsule 1    Sig: Take 1 capsule by mouth twice daily before a meal.     Gastroenterology: Proton Pump Inhibitors Passed - 05/08/2022  4:10 PM      Passed - Valid encounter within last 12 months    Recent Outpatient Visits          3 months ago Annual physical exam   [facility] Glean Hess, MD   6 months ago Eustachian tube dysfunction, right   [facility] Glean Hess, MD   6 months ago COVID-19 virus infection   [facility] Glean Hess, MD   1 year ago Acute non-recurrent maxillary sinusitis   [facility] Glean Hess, MD   1 year ago Annual physical exam   [facility] Glean Hess, MD      Future Appointments            In 8 months Army Melia Jesse Sans, MD [facility], [facility]

## 2022-05-09 ENCOUNTER — Other Ambulatory Visit: Payer: Self-pay | Admitting: Internal Medicine

## 2022-05-09 DIAGNOSIS — R600 Localized edema: Secondary | ICD-10-CM

## 2022-05-09 NOTE — Telephone Encounter (Signed)
Requested Prescriptions  Pending Prescriptions Disp Refills  . hydrochlorothiazide (HYDRODIURIL) 25 MG tablet [Pharmacy Med Name: Hydrochlorothiazide '25mg'$  Tablet] 90 tablet 0    Sig: Take 1 tablet by mouth daily.     Cardiovascular: Diuretics - Thiazide Failed - 05/09/2022  3:41 PM      Failed - Cr in normal range and within 180 days    Creatinine, Ser  Date Value Ref Range Status  01/21/2022 1.07 (H) 0.57 - 1.00 mg/dL Final         Passed - K in normal range and within 180 days    Potassium  Date Value Ref Range Status  01/21/2022 4.6 3.5 - 5.2 mmol/L Final         Passed - Na in normal range and within 180 days    Sodium  Date Value Ref Range Status  01/21/2022 143 134 - 144 mmol/L Final         Passed - Last BP in normal range    BP Readings from Last 1 Encounters:  01/21/22 124/70         Passed - Valid encounter within last 6 months    Recent Outpatient Visits          3 months ago Annual physical exam   [facility] Glean Hess, MD   6 months ago Eustachian tube dysfunction, right   [facility] Glean Hess, MD   6 months ago COVID-19 virus infection   [facility] Glean Hess, MD   1 year ago Acute non-recurrent maxillary sinusitis   [facility] Glean Hess, MD   1 year ago Annual physical exam   [facility] Glean Hess, MD      Future Appointments            In 8 months Army Melia Jesse Sans, MD [facility], [facility]

## 2022-05-15 ENCOUNTER — Other Ambulatory Visit: Payer: Self-pay | Admitting: Internal Medicine

## 2022-05-15 DIAGNOSIS — E782 Mixed hyperlipidemia: Secondary | ICD-10-CM

## 2022-05-15 NOTE — Telephone Encounter (Signed)
Requested Prescriptions  Pending Prescriptions Disp Refills  . simvastatin (ZOCOR) 20 MG tablet [Pharmacy Med Name: Simvastatin '20mg'$  Tablet] 90 tablet 2    Sig: Take 1 tablet by mouth daily.     Cardiovascular:  Antilipid - Statins Failed - 05/15/2022 12:51 PM      Failed - Lipid Panel in normal range within the last 12 months    Cholesterol, Total  Date Value Ref Range Status  01/21/2022 189 100 - 199 mg/dL Final   LDL Chol Calc (NIH)  Date Value Ref Range Status  01/21/2022 102 (H) 0 - 99 mg/dL Final   HDL  Date Value Ref Range Status  01/21/2022 61 >39 mg/dL Final   Triglycerides  Date Value Ref Range Status  01/21/2022 149 0 - 149 mg/dL Final         Passed - Patient is not pregnant      Passed - Valid encounter within last 12 months    Recent Outpatient Visits          3 months ago Annual physical exam   [facility] Glean Hess, MD   6 months ago Eustachian tube dysfunction, right   [facility] Glean Hess, MD   6 months ago COVID-19 virus infection   [facility] Glean Hess, MD   1 year ago Acute non-recurrent maxillary sinusitis   [facility] Glean Hess, MD   1 year ago Annual physical exam   [facility] Glean Hess, MD      Future Appointments            In 8 months Army Melia Jesse Sans, MD [facility], [facility]

## 2022-05-16 ENCOUNTER — Other Ambulatory Visit: Payer: Self-pay | Admitting: Internal Medicine

## 2022-05-16 DIAGNOSIS — J3089 Other allergic rhinitis: Secondary | ICD-10-CM

## 2022-05-24 DIAGNOSIS — I10 Essential (primary) hypertension: Secondary | ICD-10-CM | POA: Diagnosis not present

## 2022-06-18 ENCOUNTER — Ambulatory Visit
Admission: RE | Admit: 2022-06-18 | Discharge: 2022-06-18 | Disposition: A | Discharge status: Home / Self Care | Payer: BC Managed Care – PPO | Source: Ambulatory Visit | Attending: Internal Medicine | Admitting: Internal Medicine

## 2022-06-18 DIAGNOSIS — Z1231 Encounter for screening mammogram for malignant neoplasm of breast: Secondary | ICD-10-CM | POA: Diagnosis not present

## 2022-06-24 DIAGNOSIS — I10 Essential (primary) hypertension: Secondary | ICD-10-CM | POA: Diagnosis not present

## 2022-07-08 ENCOUNTER — Encounter: Payer: Self-pay | Admitting: Internal Medicine

## 2022-07-09 ENCOUNTER — Encounter: Payer: Self-pay | Admitting: Internal Medicine

## 2022-07-09 ENCOUNTER — Ambulatory Visit (INDEPENDENT_AMBULATORY_CARE_PROVIDER_SITE_OTHER): Payer: BC Managed Care – PPO | Admitting: Internal Medicine

## 2022-07-09 VITALS — BP 132/74 | HR 79 | Ht 64.0 in | Wt 265.0 lb

## 2022-07-09 DIAGNOSIS — N3281 Overactive bladder: Secondary | ICD-10-CM | POA: Insufficient documentation

## 2022-07-09 DIAGNOSIS — R350 Frequency of micturition: Secondary | ICD-10-CM

## 2022-07-09 LAB — POCT URINALYSIS DIPSTICK
Bilirubin, UA: NEGATIVE
Blood, UA: NEGATIVE
Glucose, UA: NEGATIVE
Ketones, UA: NEGATIVE
Leukocytes, UA: NEGATIVE
Nitrite, UA: NEGATIVE
Protein, UA: NEGATIVE
Spec Grav, UA: 1.025 (ref 1.010–1.025)
Urobilinogen, UA: 0.2 E.U./dL
pH, UA: 6 (ref 5.0–8.0)

## 2022-07-09 MED ORDER — OXYBUTYNIN CHLORIDE ER 5 MG PO TB24: 5.0000 mg | ORAL_TABLET | Freq: Every day | ORAL | 0 refills | Status: DC

## 2022-07-09 NOTE — Progress Notes (Signed)
Date:  07/09/2022   Name:  Toni Kelly   DOB:  12/05/1960   MRN:  [id number]   Chief Complaint: Urinary Tract Infection (Symptoms started 1-2 months ago. Urinary frequency, urinating every 2 hours at bedtime, sometimes does not empty bladder completely. One night this week she could not make it to the bathroom and had an accident. )  Urinary Tract Infection  Associated symptoms include frequency and urgency. Pertinent negatives include no chills.  Urinary Frequency  This is a chronic problem. The current episode started more than 1 month ago. The problem has been gradually worsening. The patient is experiencing no pain. There has been no fever. Associated symptoms include frequency and urgency. Pertinent negatives include no chills.    Lab Results  Component Value Date   NA 143 01/21/2022   K 4.6 01/21/2022   CO2 22 01/21/2022   GLUCOSE 120 (H) 01/21/2022   BUN 13 01/21/2022   CREATININE 1.07 (H) 01/21/2022   CALCIUM 9.8 01/21/2022   EGFR 59 (L) 01/21/2022   GFRNONAA 57 (L) 01/17/2021   Lab Results  Component Value Date   CHOL 189 01/21/2022   HDL 61 01/21/2022   LDLCALC 102 (H) 01/21/2022   TRIG 149 01/21/2022   CHOLHDL 3.1 01/21/2022   Lab Results  Component Value Date   TSH 1.220 01/21/2022   Lab Results  Component Value Date   HGBA1C 5.9 (H) 01/21/2022   Lab Results  Component Value Date   WBC 7.8 01/21/2022   HGB 15.2 01/21/2022   HCT 45.9 01/21/2022   MCV 88 01/21/2022   PLT 309 01/21/2022   Lab Results  Component Value Date   ALT 53 (H) 01/21/2022   AST 54 (H) 01/21/2022   ALKPHOS 80 01/21/2022   BILITOT 0.5 01/21/2022   No results found for: "25OHVITD2", "25OHVITD3", "VD25OH"   Review of Systems  Constitutional:  Negative for chills, fatigue and fever.  Genitourinary:  Positive for frequency and urgency. Negative for dysuria.    Patient Active Problem List   Diagnosis Date Noted   Environmental and seasonal allergies 03/07/2022   S/P  total hysterectomy 01/15/2021   Vaginal dysplasia 12/03/2020   Tubular adenoma of colon 02/13/2020   Morbid obesity with BMI of 40.0-44.9, adult ([place]) 08/10/2019   Constipation 12/13/2018   Abnormal EKG 09/14/2018   DDD (degenerative disc disease), lumbar 08/26/2018   Gastroesophageal reflux disease 01/14/2018   Localized edema 10/31/2015   Mixed hyperlipidemia 09/28/2015    Allergies  Allergen Reactions   Codeine Itching   Oxycodone Itching   Ultram [Tramadol] Itching    Past Surgical History:  Procedure Laterality Date   ABDOMINAL HYSTERECTOMY  08/2018   abnormal pap   CERVICAL BIOPSY  W/ LOOP ELECTRODE EXCISION  05/2017   [facility]   COLONOSCOPY  2011   @ [facility]   COLONOSCOPY WITH PROPOFOL N/A 02/10/2020   Procedure: COLONOSCOPY WITH BIOPSY;  Surgeon: Jonathon Bellows, MD;  Location: [place];  Service: Endoscopy;  Laterality: N/A;  priority 4   hemorrhoids banding  2011   POLYPECTOMY N/A 02/10/2020   Procedure: POLYPECTOMY;  Surgeon: Jonathon Bellows, MD;  Location: [place];  Service: Endoscopy;  Laterality: N/A;   TONSILLECTOMY     VAGINECTOMY, PARTIAL  09/2020   negative for dysplasia or malignancy    Social History   Tobacco Use   Smoking status: Never   Smokeless tobacco: Never  Vaping Use   Vaping Use: Never used  Substance Use  Topics   Alcohol use: Yes    Alcohol/week: 0.0 standard drinks of alcohol    Comment: rarely   Drug use: No     Medication list has been reviewed and updated.  Current Meds  Medication Sig   hydrochlorothiazide (HYDRODIURIL) 25 MG tablet Take 1 tablet by mouth daily.   levocetirizine (XYZAL) 5 MG tablet Take 1 tablet by mouth every evening.   Multiple Vitamins-Minerals (WOMENS 50+ MULTI VITAMIN/MIN PO) Take 1 tablet by mouth.   Omega-3 Fatty Acids (FISH OIL PO) Take 1 capsule by mouth daily.    omeprazole (PRILOSEC) 20 MG capsule Take 1 capsule by mouth twice daily before a meal.   simvastatin (ZOCOR) 20 MG tablet  Take 1 tablet by mouth daily.       07/09/2022    2:06 PM 01/21/2022    9:12 AM 11/01/2021    3:40 PM 10/23/2021   11:35 AM  GAD 7 : Generalized Anxiety Score  Nervous, Anxious, on Edge 0 0 0 0  Control/stop worrying 0 0 0 0  Worry too much - different things 0 0 0 0  Trouble relaxing 0 0 0 0  Restless 0 0 0 0  Easily annoyed or irritable 0 0 0 0  Afraid - awful might happen 0 0 0 0  Total GAD 7 Score 0 0 0 0  Anxiety Difficulty Not difficult at all Not difficult at all Not difficult at all        07/09/2022    2:06 PM 01/21/2022    9:12 AM 11/01/2021    3:40 PM  Depression screen PHQ 2/9  Decreased Interest 0 0 0  Down, Depressed, Hopeless 0 0 0  PHQ - 2 Score 0 0 0  Altered sleeping 0 0 0  Tired, decreased energy 0 0 0  Change in appetite 0 0 0  Feeling bad or failure about yourself  0 0 0  Trouble concentrating 0 0 0  Moving slowly or fidgety/restless 0 0 0  Suicidal thoughts 0 0 0  PHQ-9 Score 0 0 0  Difficult doing work/chores Not difficult at all Not difficult at all Not difficult at all    BP Readings from Last 3 Encounters:  07/09/22 132/74  01/21/22 124/70  11/01/21 (!) 128/102    Physical Exam Vitals and nursing note reviewed.  Constitutional:      General: She is not in acute distress.    Appearance: She is well-developed.  HENT:     Head: Normocephalic and atraumatic.  Pulmonary:     Effort: Pulmonary effort is normal. No respiratory distress.  Skin:    General: Skin is warm and dry.     Findings: No rash.  Neurological:     Mental Status: She is alert and oriented to person, place, and time.  Psychiatric:        Mood and Affect: Mood normal.        Behavior: Behavior normal.     Wt Readings from Last 3 Encounters:  07/09/22 265 lb (120.2 kg)  01/21/22 264 lb (119.7 kg)  11/01/21 265 lb (120.2 kg)    BP 132/74   Pulse 79   Ht _0  (1.626 m)   Wt 265 lb (120.2 kg)   BMI 45.49 kg/m   Assessment and Plan: 1. Urinary frequency UA  negative for infection. SG is elevated so recommend more water intake - POCT Urinalysis Dipstick  2. OAB (overactive bladder) Begin Ditropan XL every evening/bedtime. Call for refills  if beneficial. - oxybutynin (DITROPAN XL) 5 MG 24 hr tablet; Take 1 tablet (5 mg total) by mouth at bedtime.  Dispense: 30 tablet; Refill: 0   Partially dictated using Editor, commissioning. Any errors are unintentional.  Halina Maidens, MD [facility]  07/09/2022

## 2022-07-21 ENCOUNTER — Other Ambulatory Visit: Payer: Self-pay | Admitting: Internal Medicine

## 2022-07-21 DIAGNOSIS — R600 Localized edema: Secondary | ICD-10-CM

## 2022-07-22 DIAGNOSIS — H40003 Preglaucoma, unspecified, bilateral: Secondary | ICD-10-CM | POA: Diagnosis not present

## 2022-07-22 NOTE — Telephone Encounter (Signed)
Requested Prescriptions  Pending Prescriptions Disp Refills  . hydrochlorothiazide (HYDRODIURIL) 25 MG tablet [Pharmacy Med Name: Hydrochlorothiazide '25mg'$  Tablet] 90 tablet 1    Sig: Take 1 tablet by mouth daily.     Cardiovascular: Diuretics - Thiazide Failed - 07/21/2022  1:15 PM      Failed - Cr in normal range and within 180 days    Creatinine, Ser  Date Value Ref Range Status  01/21/2022 1.07 (H) 0.57 - 1.00 mg/dL Final         Failed - K in normal range and within 180 days    Potassium  Date Value Ref Range Status  01/21/2022 4.6 3.5 - 5.2 mmol/L Final         Failed - Na in normal range and within 180 days    Sodium  Date Value Ref Range Status  01/21/2022 143 134 - 144 mmol/L Final         Failed - Valid encounter within last 6 months    Recent Outpatient Visits          1 week ago Urinary frequency   [place] [facility], Jesse Sans, MD   6 months ago Annual physical exam   [place] [facility] at [facility], Jesse Sans, MD   8 months ago Eustachian tube dysfunction, right   [facility] at [facility], Jesse Sans, MD   9 months ago COVID-19 virus infection   [facility] at [facility], Jesse Sans, MD   1 year ago Acute non-recurrent maxillary sinusitis   [place] [facility] at [facility], Jesse Sans, MD      Future Appointments            In 6 months Army Melia Jesse Sans, MD [facility] and Sports Medicine at [facility], [place] BP in normal range    BP Readings from Last 1 Encounters:  07/09/22 132/74

## 2022-07-25 DIAGNOSIS — I10 Essential (primary) hypertension: Secondary | ICD-10-CM | POA: Diagnosis not present

## 2022-07-29 DIAGNOSIS — H40003 Preglaucoma, unspecified, bilateral: Secondary | ICD-10-CM | POA: Diagnosis not present

## 2022-07-29 DIAGNOSIS — Z0100 Encounter for examination of eyes and vision without abnormal findings: Secondary | ICD-10-CM | POA: Diagnosis not present

## 2022-07-31 ENCOUNTER — Other Ambulatory Visit: Payer: Self-pay | Admitting: Internal Medicine

## 2022-07-31 DIAGNOSIS — N3281 Overactive bladder: Secondary | ICD-10-CM

## 2022-08-01 NOTE — Telephone Encounter (Signed)
Requested Prescriptions  Pending Prescriptions Disp Refills  . oxybutynin (DITROPAN-XL) 5 MG 24 hr tablet [Pharmacy Med Name: OXYBUTYNIN CL ER 5 MG TABLET] 30 tablet 0    Sig: TAKE 1 TABLET BY MOUTH EVERYDAY AT BEDTIME     Urology:  Bladder Agents Passed - 07/31/2022  2:35 PM      Passed - Valid encounter within last 12 months    Recent Outpatient Visits          3 weeks ago Urinary frequency   [place] [facility] at [facility], Jesse Sans, MD   6 months ago Annual physical exam   [place] [facility] at [facility], Jesse Sans, MD   9 months ago Eustachian tube dysfunction, right   [facility] at [facility], Jesse Sans, MD   9 months ago COVID-19 virus infection   [facility] at [facility], Jesse Sans, MD   1 year ago Acute non-recurrent maxillary sinusitis   [place] [facility] at [facility], Jesse Sans, MD      Future Appointments            In 5 months Army Melia, Jesse Sans, MD [place] [facility] at [facility], [facility]

## 2022-08-20 ENCOUNTER — Encounter: Payer: Self-pay | Admitting: Internal Medicine

## 2022-08-21 ENCOUNTER — Telehealth: Payer: Self-pay

## 2022-08-21 NOTE — Telephone Encounter (Signed)
PA completed waiting on insurance approval.  Key: Toni [id number]  KP

## 2022-08-22 NOTE — Telephone Encounter (Signed)
Denied- not a covered benefit per benefit booklet or plan.  KP

## 2022-08-24 DIAGNOSIS — I10 Essential (primary) hypertension: Secondary | ICD-10-CM | POA: Diagnosis not present

## 2022-08-31 ENCOUNTER — Other Ambulatory Visit: Payer: Self-pay | Admitting: Internal Medicine

## 2022-08-31 DIAGNOSIS — N3281 Overactive bladder: Secondary | ICD-10-CM

## 2022-09-01 NOTE — Telephone Encounter (Signed)
Requested medications are due for refill today.  yes  Requested medications are on the active medications list.  yes  Last refill. 08/01/2022 #30 0 rf  Future visit scheduled.   yes  Notes to clinic.  Pharmacy needs DX code. PT would like 90 day supply.    Requested Prescriptions  Pending Prescriptions Disp Refills   oxybutynin (DITROPAN-XL) 5 MG 24 hr tablet [Pharmacy Med Name: OXYBUTYNIN CL ER 5 MG TABLET] 90 tablet 1    Sig: TAKE 1 TABLET BY MOUTH EVERYDAY AT BEDTIME     Urology:  Bladder Agents Passed - 08/31/2022 12:31 PM      Passed - Valid encounter within last 12 months    Recent Outpatient Visits           1 month ago Urinary frequency   [place] [facility] at [facility], Jesse Sans, MD   7 months ago Annual physical exam   [place] [facility] at [facility], Jesse Sans, MD   10 months ago Eustachian tube dysfunction, right   [facility] at [facility], Jesse Sans, MD   10 months ago COVID-19 virus infection   [facility] at [facility], Jesse Sans, MD   1 year ago Acute non-recurrent maxillary sinusitis   [place] [facility] at [facility], Jesse Sans, MD       Future Appointments             In 4 months Army Melia, Jesse Sans, MD [place] [facility] at [place], [facility]

## 2022-09-15 ENCOUNTER — Other Ambulatory Visit: Payer: Self-pay | Admitting: Internal Medicine

## 2022-09-15 DIAGNOSIS — N3281 Overactive bladder: Secondary | ICD-10-CM

## 2022-09-16 ENCOUNTER — Other Ambulatory Visit: Payer: Self-pay | Admitting: Internal Medicine

## 2022-09-16 DIAGNOSIS — K219 Gastro-esophageal reflux disease without esophagitis: Secondary | ICD-10-CM

## 2022-09-16 DIAGNOSIS — J3089 Other allergic rhinitis: Secondary | ICD-10-CM

## 2022-09-16 NOTE — Telephone Encounter (Signed)
Requested medication (s) are due for refill today - no  Requested medication (s) are on the active medication list -yes  Future visit scheduled -yes  Last refill: 09/01/22  Notes to clinic: Pharmacy request 90 days supply- new start medication- requires changes to original Rx  Requested Prescriptions  Pending Prescriptions Disp Refills   oxybutynin (DITROPAN-XL) 5 MG 24 hr tablet [Pharmacy Med Name: OXYBUTYNIN CL ER 5 MG TABLET] 90 tablet 1    Sig: TAKE 1 TABLET BY MOUTH EVERYDAY AT BEDTIME     Urology:  Bladder Agents Passed - 09/15/2022  8:36 PM      Passed - Valid encounter within last 12 months    Recent Outpatient Visits           2 months ago Urinary frequency   [place] [facility] at [facility], Jesse Sans, MD   7 months ago Annual physical exam   [place] [facility] at [facility], Jesse Sans, MD   10 months ago Eustachian tube dysfunction, right   [facility] and [facility] Medicine at [facility], Jesse Sans, MD   10 months ago COVID-19 virus infection   [facility] and [facility] Medicine at [facility], Jesse Sans, MD   1 year ago Acute non-recurrent maxillary sinusitis   [place] [facility] at [facility], Jesse Sans, MD       Future Appointments             In 4 months Army Melia Jesse Sans, MD [facility] Primary Care and Sports Medicine at [facility], [facility]               Requested Prescriptions  Pending Prescriptions Disp Refills   oxybutynin (DITROPAN-XL) 5 MG 24 hr tablet [Pharmacy Med Name: OXYBUTYNIN CL ER 5 MG TABLET] 90 tablet 1    Sig: TAKE 1 TABLET BY MOUTH EVERYDAY AT BEDTIME     Urology:  Bladder Agents Passed - 09/15/2022  8:36 PM      Passed - Valid encounter within last 12 months    Recent Outpatient Visits           2 months ago Urinary frequency   [place] [facility] at [facility], Jesse Sans, MD   7 months ago Annual physical exam   [facility] [facility], Jesse Sans, MD   10 months ago Eustachian tube dysfunction, right   [place] at [facility], Jesse Sans, MD   10 months ago COVID-19 virus infection   [facility] Care and Sports Medicine at [facility], Jesse Sans, MD   1 year ago Acute non-recurrent maxillary sinusitis   [place] [facility] Medicine at [facility], Jesse Sans, MD       Future Appointments             In 4 months Army Melia, Jesse Sans, MD [place] and Sports Medicine at [facility], [facility]

## 2022-09-17 NOTE — Telephone Encounter (Signed)
Requested Prescriptions  Pending Prescriptions Disp Refills  . omeprazole (PRILOSEC) 20 MG capsule [Pharmacy Med Name: Omeprazole 20mg Delayed-Release Capsule] 180 capsule 1    Sig: Take 1 capsule by mouth twice daily before a meal.     Gastroenterology: Proton Pump Inhibitors Passed - 09/16/2022 11:03 AM      Passed - Valid encounter within last 12 months    Recent Outpatient Visits          2 months ago Urinary frequency   [place] [facility] at [facility], Laura H, MD   7 months ago Annual physical exam   [place] [facility] at [facility], Laura H, MD   10 months ago Eustachian tube dysfunction, right   [place] [facility] at [facility], Laura H, MD   10 months ago COVID-19 virus infection   [place] [facility] at [facility], Laura H, MD   1 year ago Acute non-recurrent maxillary sinusitis   [place] [facility] at [facility], Laura H, MD      Future Appointments            In 4 months Berglund, Laura H, MD [place] [facility] at [facility], [facility]           . levocetirizine (XYZAL) 5 MG tablet [Pharmacy Med Name: Levocetirizine 5mg Tablet] 90 tablet 1    Sig: Take 1 tablet by mouth every evening.     Ear, Nose, and Throat:  Antihistamines - levocetirizine dihydrochloride Failed - 09/16/2022 11:03 AM      Failed - Cr in normal range and within 360 days    Creatinine, Ser  Date Value Ref Range Status  01/21/2022 1.07 (H) 0.57 - 1.00 mg/dL Final         Passed - eGFR is 10 or above and within 360 days    GFR calc Af Amer  Date Value Ref Range Status  01/17/2021 66 >59 mL/min/1.73 Final    Comment:    **In accordance with recommendations from the NKF-ASN Task force,**   [facility] is in the process of updating its eGFR calculation to the   2021  CKD-EPI creatinine equation that estimates kidney function   without a race variable.    GFR calc non Af Amer  Date Value Ref Range Status  01/17/2021 57 (L) >59 mL/min/1.73 Final   eGFR  Date Value Ref Range Status  01/21/2022 59 (L) >59 mL/min/1.73 Final         Passed - Valid encounter within last 12 months    Recent Outpatient Visits          2 months ago Urinary frequency   [place] [facility] at [facility], Laura H, MD   7 months ago Annual physical exam   [place] [facility] at [facility], Laura H, MD   10 months ago Eustachian tube dysfunction, right   [place] [facility] at [facility], Laura H, MD   10 months ago COVID-19 virus infection   [place] [facility] at [facility], Laura H, MD   1 year ago Acute non-recurrent maxillary sinusitis   [place] [facility] at [facility], Laura H, MD        Future Appointments            In 4 months Army Melia, Jesse Sans, MD [place] and Sports Medicine at [facility], [facility]

## 2022-09-18 ENCOUNTER — Telehealth: Payer: Self-pay

## 2022-09-18 NOTE — Telephone Encounter (Signed)
Xyzal 5 MG  PA completed waiting on insurance approval.  Key: [id number]  KP

## 2022-09-19 NOTE — Telephone Encounter (Signed)
Denied  KP

## 2022-09-23 ENCOUNTER — Encounter: Payer: Self-pay | Admitting: Internal Medicine

## 2022-09-24 DIAGNOSIS — I10 Essential (primary) hypertension: Secondary | ICD-10-CM | POA: Diagnosis not present

## 2022-10-12 ENCOUNTER — Encounter: Payer: Self-pay | Admitting: Internal Medicine

## 2022-10-13 ENCOUNTER — Encounter: Payer: Self-pay | Admitting: Internal Medicine

## 2022-10-13 ENCOUNTER — Ambulatory Visit: Payer: Self-pay | Admitting: *Deleted

## 2022-10-13 ENCOUNTER — Ambulatory Visit (INDEPENDENT_AMBULATORY_CARE_PROVIDER_SITE_OTHER): Payer: BC Managed Care – PPO | Admitting: Internal Medicine

## 2022-10-13 VITALS — BP 136/78 | HR 100 | Temp 98.9°F | Ht 64.0 in | Wt 263.0 lb

## 2022-10-13 DIAGNOSIS — R051 Acute cough: Secondary | ICD-10-CM | POA: Diagnosis not present

## 2022-10-13 DIAGNOSIS — J029 Acute pharyngitis, unspecified: Secondary | ICD-10-CM | POA: Diagnosis not present

## 2022-10-13 LAB — POC COVID19 BINAXNOW: SARS Coronavirus 2 Ag: NEGATIVE

## 2022-10-13 LAB — POCT INFLUENZA A/B
Influenza A, POC: NEGATIVE
Influenza B, POC: NEGATIVE

## 2022-10-13 MED ORDER — AMOXICILLIN-POT CLAVULANATE 875-125 MG PO TABS: 1.0000 | ORAL_TABLET | Freq: Two times a day (BID) | ORAL | 0 refills | Status: AC

## 2022-10-13 NOTE — Telephone Encounter (Signed)
Closing the triage call.   Pt. Has been in contact with the practice via MyChart and an appt. Set up for 11:20 today.

## 2022-10-13 NOTE — Telephone Encounter (Signed)
Message from Luciana Axe sent at 10/13/2022  8:03 AM EST  Summary: diarrhea Advice   Pt is calling to report that she has a cough, diarrhea since this morning, headache, chills, hoarse voice. Please advise          Call History   Type Contact Phone/Fax User  10/13/2022 08:01 AM EST Phone (Incoming) Mikala, [place] (Self) [phone] Lemmie Evens) Luciana Axe

## 2022-10-13 NOTE — Progress Notes (Signed)
Date:  10/13/2022   Name:  Toni Kelly   DOB:  September 07, 1961   MRN:  [id number]   Chief Complaint: Cough  HPI Cough This is a new problem. The current episode started in the past 7 days. The problem has been waxing and waning. Associated symptoms include chills and headaches. Associated symptoms comments: Diarrhea..     Lab Results  Component Value Date   NA 143 01/21/2022   K 4.6 01/21/2022   CO2 22 01/21/2022   GLUCOSE 120 (H) 01/21/2022   BUN 13 01/21/2022   CREATININE 1.07 (H) 01/21/2022   CALCIUM 9.8 01/21/2022   EGFR 59 (L) 01/21/2022   GFRNONAA 57 (L) 01/17/2021   Lab Results  Component Value Date   CHOL 189 01/21/2022   HDL 61 01/21/2022   LDLCALC 102 (H) 01/21/2022   TRIG 149 01/21/2022   CHOLHDL 3.1 01/21/2022   Lab Results  Component Value Date   TSH 1.220 01/21/2022   Lab Results  Component Value Date   HGBA1C 5.9 (H) 01/21/2022   Lab Results  Component Value Date   WBC 7.8 01/21/2022   HGB 15.2 01/21/2022   HCT 45.9 01/21/2022   MCV 88 01/21/2022   PLT 309 01/21/2022   Lab Results  Component Value Date   ALT 53 (H) 01/21/2022   AST 54 (H) 01/21/2022   ALKPHOS 80 01/21/2022   BILITOT 0.5 01/21/2022   No results found for: "25OHVITD2", "25OHVITD3", "VD25OH"   Review of Systems  Constitutional:  Positive for chills and diaphoresis. Negative for fatigue and fever.  HENT:  Positive for congestion and postnasal drip. Negative for ear pain and sore throat.   Respiratory:  Positive for cough. Negative for chest tightness, shortness of breath and wheezing.   Cardiovascular:  Negative for chest pain.  Gastrointestinal:  Positive for diarrhea. Negative for abdominal distention and abdominal pain.    Patient Active Problem List   Diagnosis Date Noted   OAB (overactive bladder) 07/09/2022   Environmental and seasonal allergies 03/07/2022   S/P total hysterectomy 01/15/2021   Vaginal dysplasia 12/03/2020   Tubular adenoma of colon 02/13/2020    Morbid obesity with BMI of 40.0-44.9, adult ([place]) 08/10/2019   Constipation 12/13/2018   Abnormal EKG 09/14/2018   DDD (degenerative disc disease), lumbar 08/26/2018   Gastroesophageal reflux disease 01/14/2018   Localized edema 10/31/2015   Mixed hyperlipidemia 09/28/2015    Allergies  Allergen Reactions   Codeine Itching   Oxycodone Itching   Ultram [Tramadol] Itching    Past Surgical History:  Procedure Laterality Date   ABDOMINAL HYSTERECTOMY  08/2018   abnormal pap   CERVICAL BIOPSY  W/ LOOP ELECTRODE EXCISION  05/2017   [facility]   COLONOSCOPY  2011   @ [facility]   COLONOSCOPY WITH PROPOFOL N/A 02/10/2020   Procedure: COLONOSCOPY WITH BIOPSY;  Surgeon: Jonathon Bellows, MD;  Location: [facility];  Service: Endoscopy;  Laterality: N/A;  priority 4   hemorrhoids banding  2011   POLYPECTOMY N/A 02/10/2020   Procedure: POLYPECTOMY;  Surgeon: Jonathon Bellows, MD;  Location: [facility];  Service: Endoscopy;  Laterality: N/A;   TONSILLECTOMY     VAGINECTOMY, PARTIAL  09/2020   negative for dysplasia or malignancy    Social History   Tobacco Use   Smoking status: Never   Smokeless tobacco: Never  Vaping Use   Vaping Use: Never used  Substance Use Topics   Alcohol use: Yes    Alcohol/week: 0.0 standard drinks  of alcohol    Comment: rarely   Drug use: No     Medication list has been reviewed and updated.  Current Meds  Medication Sig   hydrochlorothiazide (HYDRODIURIL) 25 MG tablet Take 1 tablet by mouth daily.   levocetirizine (XYZAL) 5 MG tablet Take 1 tablet by mouth every evening.   Multiple Vitamins-Minerals (WOMENS 50+ MULTI VITAMIN/MIN PO) Take 1 tablet by mouth.   Omega-3 Fatty Acids (FISH OIL PO) Take 1 capsule by mouth daily.    omeprazole (PRILOSEC) 20 MG capsule Take 1 capsule by mouth twice daily before a meal.   oxybutynin (DITROPAN-XL) 5 MG 24 hr tablet TAKE 1 TABLET BY MOUTH EVERYDAY AT BEDTIME   simvastatin (ZOCOR) 20 MG tablet Take 1 tablet  by mouth daily.       07/09/2022    2:06 PM 01/21/2022    9:12 AM 11/01/2021    3:40 PM 10/23/2021   11:35 AM  GAD 7 : Generalized Anxiety Score  Nervous, Anxious, on Edge 0 0 0 0  Control/stop worrying 0 0 0 0  Worry too much - different things 0 0 0 0  Trouble relaxing 0 0 0 0  Restless 0 0 0 0  Easily annoyed or irritable 0 0 0 0  Afraid - awful might happen 0 0 0 0  Total GAD 7 Score 0 0 0 0  Anxiety Difficulty Not difficult at all Not difficult at all Not difficult at all        07/09/2022    2:06 PM 01/21/2022    9:12 AM 11/01/2021    3:40 PM  Depression screen PHQ 2/9  Decreased Interest 0 0 0  Down, Depressed, Hopeless 0 0 0  PHQ - 2 Score 0 0 0  Altered sleeping 0 0 0  Tired, decreased energy 0 0 0  Change in appetite 0 0 0  Feeling bad or failure about yourself  0 0 0  Trouble concentrating 0 0 0  Moving slowly or fidgety/restless 0 0 0  Suicidal thoughts 0 0 0  PHQ-9 Score 0 0 0  Difficult doing work/chores Not difficult at all Not difficult at all Not difficult at all    BP Readings from Last 3 Encounters:  10/13/22 (!) 148/100  07/09/22 132/74  01/21/22 124/70    Physical Exam HENT:     Right Ear: Tympanic membrane normal.     Left Ear: Tympanic membrane normal.     Nose:     Right Sinus: No maxillary sinus tenderness or frontal sinus tenderness.     Left Sinus: No maxillary sinus tenderness or frontal sinus tenderness.     Mouth/Throat:     Pharynx: Posterior oropharyngeal erythema present. No oropharyngeal exudate.  Cardiovascular:     Rate and Rhythm: Normal rate and regular rhythm.  Pulmonary:     Effort: Pulmonary effort is normal.     Breath sounds: No wheezing or rhonchi.  Musculoskeletal:     Cervical back: Normal range of motion.  Lymphadenopathy:     Cervical: No cervical adenopathy.  Neurological:     Mental Status: She is alert.     Wt Readings from Last 3 Encounters:  10/13/22 263 lb (119.3 kg)  07/09/22 265 lb (120.2 kg)   01/21/22 264 lb (119.7 kg)    BP (!) 148/100   Pulse 100   Temp 98.9 F (37.2 C) (Oral)   Ht _0  (1.626 m)   Wt 263 lb (119.3 kg)  SpO2 92%   BMI 45.14 kg/m   Assessment and Plan: 1. Acute pharyngitis, unspecified etiology Continue Tylenol for headache or sore throat Push fluids Covid and flu tests are negative. - amoxicillin-clavulanate (AUGMENTIN) 875-125 MG tablet; Take 1 tablet by mouth 2 (two) times daily for 10 days.  Dispense: 20 tablet; Refill: 0  2. Acute cough - POC COVID-19 BinaxNow - POCT Influenza A/B   Partially dictated using Editor, commissioning. Any errors are unintentional.  Halina Maidens, MD [facility]  10/13/2022

## 2022-10-13 NOTE — Telephone Encounter (Signed)
Attempted to return call.   Pt has an appt. Today at 11:20 with Dr. Army Melia.   Left message to call back.

## 2022-10-21 ENCOUNTER — Encounter: Payer: Self-pay | Admitting: Internal Medicine

## 2022-10-21 NOTE — Telephone Encounter (Signed)
Please review.  KP

## 2022-10-24 DIAGNOSIS — I10 Essential (primary) hypertension: Secondary | ICD-10-CM | POA: Diagnosis not present

## 2022-10-25 ENCOUNTER — Encounter: Payer: Self-pay | Admitting: Internal Medicine

## 2022-10-26 ENCOUNTER — Other Ambulatory Visit: Payer: Self-pay | Admitting: Internal Medicine

## 2022-10-26 DIAGNOSIS — J029 Acute pharyngitis, unspecified: Secondary | ICD-10-CM

## 2022-10-26 MED ORDER — AZITHROMYCIN 250 MG PO TABS: ORAL_TABLET | ORAL | 0 refills | Status: AC

## 2022-10-31 ENCOUNTER — Encounter: Payer: Self-pay | Admitting: Internal Medicine

## 2022-11-03 ENCOUNTER — Ambulatory Visit
Admission: RE | Admit: 2022-11-03 | Discharge: 2022-11-03 | Disposition: A | Discharge status: Home / Self Care | Payer: BC Managed Care – PPO | Attending: Internal Medicine | Admitting: Internal Medicine

## 2022-11-03 ENCOUNTER — Ambulatory Visit
Admission: RE | Admit: 2022-11-03 | Discharge: 2022-11-03 | Disposition: A | Discharge status: Home / Self Care | Payer: BC Managed Care – PPO | Source: Ambulatory Visit | Attending: Internal Medicine | Admitting: Internal Medicine

## 2022-11-03 ENCOUNTER — Encounter: Payer: Self-pay | Admitting: Internal Medicine

## 2022-11-03 ENCOUNTER — Ambulatory Visit (INDEPENDENT_AMBULATORY_CARE_PROVIDER_SITE_OTHER): Payer: BC Managed Care – PPO | Admitting: Internal Medicine

## 2022-11-03 VITALS — BP 128/74 | HR 78 | Temp 98.2°F | Ht 64.0 in | Wt 262.8 lb

## 2022-11-03 DIAGNOSIS — R059 Cough, unspecified: Secondary | ICD-10-CM | POA: Diagnosis not present

## 2022-11-03 DIAGNOSIS — J189 Pneumonia, unspecified organism: Secondary | ICD-10-CM

## 2022-11-03 MED ORDER — LEVOFLOXACIN 500 MG PO TABS: 500.0000 mg | ORAL_TABLET | Freq: Every day | ORAL | 0 refills | Status: AC

## 2022-11-03 MED ORDER — PREDNISONE 10 MG PO TABS: 10.0000 mg | ORAL_TABLET | ORAL | 0 refills | Status: AC

## 2022-11-03 MED ORDER — PROMETHAZINE-DM 6.25-15 MG/5ML PO SYRP: 5.0000 mL | ORAL_SOLUTION | Freq: Four times a day (QID) | ORAL | 0 refills | Status: AC | PRN

## 2022-11-03 NOTE — Progress Notes (Signed)
  Date:  11/03/2022   Name:  Toni Kelly   DOB:  05/19/1961   MRN:  [id number]   Chief Complaint: Cough  Cough This is a new problem. Episode onset: 4 weeks. The problem has been unchanged. The cough is Productive of sputum. Associated symptoms include wheezing. Pertinent negatives include no chest pain, ear pain, fever, headaches, myalgias, postnasal drip, sore throat or shortness of breath. The symptoms are aggravated by lying down. Treatments tried: 2 antbiotics - not any better.    Lab Results  Component Value Date   NA 143 01/21/2022   K 4.6 01/21/2022   CO2 22 01/21/2022   GLUCOSE 120 (H) 01/21/2022   BUN 13 01/21/2022   CREATININE 1.07 (H) 01/21/2022   CALCIUM 9.8 01/21/2022   EGFR 59 (L) 01/21/2022   GFRNONAA 57 (L) 01/17/2021   Lab Results  Component Value Date   CHOL 189 01/21/2022   HDL 61 01/21/2022   LDLCALC 102 (H) 01/21/2022   TRIG 149 01/21/2022   CHOLHDL 3.1 01/21/2022   Lab Results  Component Value Date   TSH 1.220 01/21/2022   Lab Results  Component Value Date   HGBA1C 5.9 (H) 01/21/2022   Lab Results  Component Value Date   WBC 7.8 01/21/2022   HGB 15.2 01/21/2022   HCT 45.9 01/21/2022   MCV 88 01/21/2022   PLT 309 01/21/2022   Lab Results  Component Value Date   ALT 53 (H) 01/21/2022   AST 54 (H) 01/21/2022   ALKPHOS 80 01/21/2022   BILITOT 0.5 01/21/2022   No results found for: "25OHVITD2", "25OHVITD3", "VD25OH"   Review of Systems  Constitutional:  Positive for fatigue. Negative for appetite change, diaphoresis and fever.  HENT:  Negative for ear pain, postnasal drip, sinus pressure and sore throat.   Respiratory:  Positive for cough and wheezing. Negative for chest tightness and shortness of breath.   Cardiovascular:  Negative for chest pain and palpitations.  Musculoskeletal:  Negative for myalgias.  Neurological:  Negative for dizziness and headaches.  Psychiatric/Behavioral:  Positive for sleep disturbance. Negative for  dysphoric mood. The patient is not nervous/anxious.     Patient Active Problem List   Diagnosis Date Noted   OAB (overactive bladder) 07/09/2022   Environmental and seasonal allergies 03/07/2022   S/P total hysterectomy 01/15/2021   Vaginal dysplasia 12/03/2020   Tubular adenoma of colon 02/13/2020   Morbid obesity with BMI of 40.0-44.9, adult ([facility]) 08/10/2019   Constipation 12/13/2018   Abnormal EKG 09/14/2018   DDD (degenerative disc disease), lumbar 08/26/2018   Gastroesophageal reflux disease 01/14/2018   Localized edema 10/31/2015   Mixed hyperlipidemia 09/28/2015    Allergies  Allergen Reactions   Codeine Itching   Oxycodone Itching   Ultram [Tramadol] Itching    Past Surgical History:  Procedure Laterality Date   ABDOMINAL HYSTERECTOMY  08/2018   abnormal pap   CERVICAL BIOPSY  W/ LOOP ELECTRODE EXCISION  05/2017   [facility]   COLONOSCOPY  2011   @ [facility]   COLONOSCOPY WITH PROPOFOL N/A 02/10/2020   Procedure: COLONOSCOPY WITH BIOPSY;  Surgeon: Anna, Kiran, MD;  Location: [facility];  Service: Endoscopy;  Laterality: N/A;  priority 4   hemorrhoids banding  2011   POLYPECTOMY N/A 02/10/2020   Procedure: POLYPECTOMY;  Surgeon: Anna, Kiran, MD;  Location: [facility];  Service: Endoscopy;  Laterality: N/A;   TONSILLECTOMY     VAGINECTOMY, PARTIAL  09/2020   negative for dysplasia or malignancy      Social History   Tobacco Use   Smoking status: Never   Smokeless tobacco: Never  Vaping Use   Vaping Use: Never used  Substance Use Topics   Alcohol use: Yes    Alcohol/week: 0.0 standard drinks of alcohol    Comment: rarely   Drug use: No     Medication list has been reviewed and updated.  Current Meds  Medication Sig   hydrochlorothiazide (HYDRODIURIL) 25 MG tablet Take 1 tablet by mouth daily.   levocetirizine (XYZAL) 5 MG tablet Take 1 tablet by mouth every evening.   Multiple Vitamins-Minerals (WOMENS 50+ MULTI VITAMIN/MIN PO) Take 1  tablet by mouth.   Omega-3 Fatty Acids (FISH OIL PO) Take 1 capsule by mouth daily.    omeprazole (PRILOSEC) 20 MG capsule Take 1 capsule by mouth twice daily before a meal.   oxybutynin (DITROPAN-XL) 5 MG 24 hr tablet TAKE 1 TABLET BY MOUTH EVERYDAY AT BEDTIME   simvastatin (ZOCOR) 20 MG tablet Take 1 tablet by mouth daily.       11/03/2022    8:57 AM 07/09/2022    2:06 PM 01/21/2022    9:12 AM 11/01/2021    3:40 PM  GAD 7 : Generalized Anxiety Score  Nervous, Anxious, on Edge 0 0 0 0  Control/stop worrying 0 0 0 0  Worry too much - different things 0 0 0 0  Trouble relaxing 0 0 0 0  Restless 0 0 0 0  Easily annoyed or irritable 0 0 0 0  Afraid - awful might happen 0 0 0 0  Total GAD 7 Score 0 0 0 0  Anxiety Difficulty Not difficult at all Not difficult at all Not difficult at all Not difficult at all       11/03/2022    8:57 AM 07/09/2022    2:06 PM 01/21/2022    9:12 AM  Depression screen PHQ 2/9  Decreased Interest 0 0 0  Down, Depressed, Hopeless 0 0 0  PHQ - 2 Score 0 0 0  Altered sleeping 0 0 0  Tired, decreased energy 0 0 0  Change in appetite 0 0 0  Feeling bad or failure about yourself  0 0 0  Trouble concentrating 0 0 0  Moving slowly or fidgety/restless 0 0 0  Suicidal thoughts 0 0 0  PHQ-9 Score 0 0 0  Difficult doing work/chores Not difficult at all Not difficult at all Not difficult at all    BP Readings from Last 3 Encounters:  11/03/22 128/74  10/13/22 136/78  07/09/22 132/74    Physical Exam Vitals and nursing note reviewed.  Constitutional:      General: She is not in acute distress.    Appearance: She is well-developed. She is ill-appearing.  HENT:     Head: Normocephalic and atraumatic.  Cardiovascular:     Rate and Rhythm: Normal rate and regular rhythm.  Pulmonary:     Effort: Pulmonary effort is normal. No respiratory distress.     Breath sounds: Examination of the right-upper field reveals rhonchi. Examination of the left-upper field  reveals rhonchi. Rhonchi present. No wheezing.  Musculoskeletal:     Cervical back: Normal range of motion.  Lymphadenopathy:     Cervical: No cervical adenopathy.  Skin:    General: Skin is warm and dry.     Findings: No rash.  Neurological:     Mental Status: She is alert and oriented to person, place, and time.  Psychiatric:  Mood and Affect: Mood normal.        Behavior: Behavior normal.     Wt Readings from Last 3 Encounters:  11/03/22 262 lb 12.8 oz (119.2 kg)  10/13/22 263 lb (119.3 kg)  07/09/22 265 lb (120.2 kg)    BP 128/74   Pulse 78   Temp 98.2 F (36.8 C) (Oral)   Ht 5' 4" (1.626 m)   Wt 262 lb 12.8 oz (119.2 kg)   SpO2 93%   BMI 45.11 kg/m   Assessment and Plan: 1. Community acquired pneumonia of right upper lobe of lung Persistent sputum and cough despite Augmentin and Zpak Will get CXR to rule out other pathology Steroid taper, cough syrup and Levaquin - DG Chest 2 View - predniSONE (DELTASONE) 10 MG tablet; Take 1 tablet (10 mg total) by mouth as directed for 6 days. Take 6,5,4,3,2,1 then stop  Dispense: 21 tablet; Refill: 0 - promethazine-dextromethorphan (PROMETHAZINE-DM) 6.25-15 MG/5ML syrup; Take 5 mLs by mouth 4 (four) times daily as needed for up to 9 days for cough.  Dispense: 118 mL; Refill: 0 - levofloxacin (LEVAQUIN) 500 MG tablet; Take 1 tablet (500 mg total) by mouth daily for 7 days.  Dispense: 7 tablet; Refill: 0   Partially dictated using Editor, commissioning. Any errors are unintentional.  Halina Maidens, MD [facility]  11/03/2022

## 2022-11-18 ENCOUNTER — Other Ambulatory Visit: Payer: Self-pay | Admitting: Internal Medicine

## 2022-11-18 DIAGNOSIS — R600 Localized edema: Secondary | ICD-10-CM

## 2022-12-25 ENCOUNTER — Encounter: Payer: Self-pay | Admitting: Internal Medicine

## 2023-01-13 ENCOUNTER — Other Ambulatory Visit: Payer: Self-pay | Admitting: Obstetrics and Gynecology

## 2023-01-13 DIAGNOSIS — Z1231 Encounter for screening mammogram for malignant neoplasm of breast: Secondary | ICD-10-CM

## 2023-01-23 ENCOUNTER — Encounter: Payer: Self-pay | Admitting: Internal Medicine

## 2023-01-23 ENCOUNTER — Ambulatory Visit (INDEPENDENT_AMBULATORY_CARE_PROVIDER_SITE_OTHER): Payer: BC Managed Care – PPO | Admitting: Internal Medicine

## 2023-01-23 VITALS — BP 124/78 | HR 84 | Ht 64.0 in | Wt 267.2 lb

## 2023-01-23 DIAGNOSIS — E782 Mixed hyperlipidemia: Secondary | ICD-10-CM | POA: Diagnosis not present

## 2023-01-23 DIAGNOSIS — K219 Gastro-esophageal reflux disease without esophagitis: Secondary | ICD-10-CM

## 2023-01-23 DIAGNOSIS — Z0000 Encounter for general adult medical examination without abnormal findings: Secondary | ICD-10-CM | POA: Diagnosis not present

## 2023-01-23 DIAGNOSIS — R600 Localized edema: Secondary | ICD-10-CM | POA: Diagnosis not present

## 2023-01-23 DIAGNOSIS — R7303 Prediabetes: Secondary | ICD-10-CM | POA: Diagnosis not present

## 2023-01-23 DIAGNOSIS — Z1231 Encounter for screening mammogram for malignant neoplasm of breast: Secondary | ICD-10-CM

## 2023-01-23 DIAGNOSIS — N3281 Overactive bladder: Secondary | ICD-10-CM

## 2023-01-23 DIAGNOSIS — J3089 Other allergic rhinitis: Secondary | ICD-10-CM

## 2023-01-23 DIAGNOSIS — Z6841 Body Mass Index (BMI) 40.0 and over, adult: Secondary | ICD-10-CM

## 2023-01-23 MED ORDER — LEVOCETIRIZINE DIHYDROCHLORIDE 5 MG PO TABS: 5.0000 mg | ORAL_TABLET | Freq: Every evening | ORAL | 3 refills | Status: DC

## 2023-01-23 MED ORDER — SIMVASTATIN 20 MG PO TABS: 20.0000 mg | ORAL_TABLET | Freq: Every day | ORAL | 3 refills | Status: DC

## 2023-01-23 MED ORDER — HYDROCHLOROTHIAZIDE 25 MG PO TABS: 25.0000 mg | ORAL_TABLET | Freq: Every day | ORAL | 3 refills | Status: DC

## 2023-01-23 MED ORDER — OMEPRAZOLE 20 MG PO CPDR: DELAYED_RELEASE_CAPSULE | ORAL | 3 refills | Status: DC

## 2023-01-23 MED ORDER — OXYBUTYNIN CHLORIDE ER 5 MG PO TB24: ORAL_TABLET | ORAL | 3 refills | Status: DC

## 2023-01-23 NOTE — Assessment & Plan Note (Signed)
Symptoms well controlled on medications

## 2023-01-23 NOTE — Assessment & Plan Note (Signed)
Controlled with hctz

## 2023-01-23 NOTE — Assessment & Plan Note (Signed)
Symptoms well controlled on daily PPI No red flag signs such as weight loss, n/v, melena

## 2023-01-23 NOTE — Assessment & Plan Note (Signed)
Tolerating statin medications without concerns LDL is  Lab Results  Component Value Date   LDLCALC 102 (H) 01/21/2022   with a goal of < 70. Current dose will be adjusted if needed.

## 2023-01-23 NOTE — Progress Notes (Signed)
Date:  01/23/2023   Name:  Toni Kelly   DOB:  August 28, 1961   MRN:  [id number]   Chief Complaint: Annual Exam Toni Kelly is a 62 y.o. female who presents today for her Complete Annual Exam. She feels well. She reports exercising - none. She reports she is sleeping well. Breast complaints - none.  Mammogram: 05/2022 - scheduled DEXA: none Pap smear: 11/2020 neg/neg Colonoscopy: 01/2020 repeat 7 yrs  Health Maintenance Due  Topic Date Due   HIV Screening  Never done   COVID-19 Vaccine (7 - 2023-24 season) 10/28/2022    Immunization History  Administered Date(s) Administered   Influenza,inj,Quad PF,6+ Mos 08/08/2018, 08/10/2020   Influenza,inj,quad, With Preservative 07/26/2019   Influenza-Unspecified 09/07/2017, 07/25/2021, 09/02/2022   PFIZER Comirnaty(Gray Top)Covid-19 Tri-Sucrose Vaccine 05/11/2021   PFIZER(Purple Top)SARS-COV-2 Vaccination 02/03/2020, 02/28/2020, 08/25/2020, 08/10/2021, 09/02/2022   Pneumococcal Polysaccharide-23 07/26/2019   Tdap 08/10/2021   Unspecified SARS-COV-2 Vaccination 08/08/2022   Zoster Recombinat (Shingrix) 03/21/2019, 07/29/2019    Gastroesophageal Reflux She complains of heartburn. She reports no abdominal pain, no chest pain, no coughing or no wheezing. This is a recurrent problem. The problem occurs rarely. Pertinent negatives include no fatigue. She has tried a PPI for the symptoms.  Hyperlipidemia This is a chronic problem. The problem is controlled. Pertinent negatives include no chest pain or shortness of breath. Current antihyperlipidemic treatment includes statins. The current treatment provides significant improvement of lipids.    Lab Results  Component Value Date   NA 143 01/21/2022   K 4.6 01/21/2022   CO2 22 01/21/2022   GLUCOSE 120 (H) 01/21/2022   BUN 13 01/21/2022   CREATININE 1.07 (H) 01/21/2022   CALCIUM 9.8 01/21/2022   EGFR 59 (L) 01/21/2022   GFRNONAA 57 (L) 01/17/2021   Lab Results  Component Value Date    CHOL 189 01/21/2022   HDL 61 01/21/2022   LDLCALC 102 (H) 01/21/2022   TRIG 149 01/21/2022   CHOLHDL 3.1 01/21/2022   Lab Results  Component Value Date   TSH 1.220 01/21/2022   Lab Results  Component Value Date   HGBA1C 5.9 (H) 01/21/2022   Lab Results  Component Value Date   WBC 7.8 01/21/2022   HGB 15.2 01/21/2022   HCT 45.9 01/21/2022   MCV 88 01/21/2022   PLT 309 01/21/2022   Lab Results  Component Value Date   ALT 53 (H) 01/21/2022   AST 54 (H) 01/21/2022   ALKPHOS 80 01/21/2022   BILITOT 0.5 01/21/2022   No results found for: "25OHVITD2", "25OHVITD3", "VD25OH"   Review of Systems  Constitutional:  Negative for chills, fatigue and fever.  HENT:  Negative for congestion, hearing loss, tinnitus, trouble swallowing and voice change.   Eyes:  Negative for visual disturbance.  Respiratory:  Negative for cough, chest tightness, shortness of breath and wheezing.   Cardiovascular:  Negative for chest pain, palpitations and leg swelling.  Gastrointestinal:  Positive for heartburn. Negative for abdominal pain, constipation, diarrhea and vomiting.  Endocrine: Negative for polydipsia and polyuria.  Genitourinary:  Negative for dysuria, frequency, genital sores, vaginal bleeding and vaginal discharge.  Musculoskeletal:  Negative for arthralgias, gait problem and joint swelling.  Skin:  Negative for color change and rash.  Neurological:  Negative for dizziness, tremors, light-headedness and headaches.  Hematological:  Negative for adenopathy. Does not bruise/bleed easily.  Psychiatric/Behavioral:  Negative for dysphoric mood and sleep disturbance. The patient is not nervous/anxious.     Patient Active Problem List  Diagnosis Date Noted   OAB (overactive bladder) 07/09/2022   Environmental and seasonal allergies 03/07/2022   S/P total hysterectomy 01/15/2021   Vaginal dysplasia 12/03/2020   Tubular adenoma of colon 02/13/2020   Morbid obesity with BMI of 40.0-44.9,  adult ([place]) 08/10/2019   Constipation 12/13/2018   Abnormal EKG 09/14/2018   DDD (degenerative disc disease), lumbar 08/26/2018   Gastroesophageal reflux disease 01/14/2018   Localized edema 10/31/2015   Mixed hyperlipidemia 09/28/2015    Allergies  Allergen Reactions   Codeine Itching   Oxycodone Itching   Ultram [Tramadol] Itching    Past Surgical History:  Procedure Laterality Date   ABDOMINAL HYSTERECTOMY  08/2018   abnormal pap   CERVICAL BIOPSY  W/ LOOP ELECTRODE EXCISION  05/2017   [facility]   COLONOSCOPY  2011   @ [facility]   COLONOSCOPY WITH PROPOFOL N/A 02/10/2020   Procedure: COLONOSCOPY WITH BIOPSY;  Surgeon: Jonathon Bellows, MD;  Location: [facility];  Service: Endoscopy;  Laterality: N/A;  priority 4   hemorrhoids banding  2011   POLYPECTOMY N/A 02/10/2020   Procedure: POLYPECTOMY;  Surgeon: Jonathon Bellows, MD;  Location: [facility];  Service: Endoscopy;  Laterality: N/A;   TONSILLECTOMY     VAGINECTOMY, PARTIAL  09/2020   negative for dysplasia or malignancy    Social History   Tobacco Use   Smoking status: Never   Smokeless tobacco: Never  Vaping Use   Vaping Use: Never used  Substance Use Topics   Alcohol use: Yes    Alcohol/week: 0.0 standard drinks of alcohol    Comment: rarely   Drug use: No     Medication list has been reviewed and updated.  Current Meds  Medication Sig   Multiple Vitamins-Minerals (WOMENS 50+ MULTI VITAMIN/MIN PO) Take 1 tablet by mouth.   Omega-3 Fatty Acids (FISH OIL PO) Take 1 capsule by mouth daily.    [DISCONTINUED] hydrochlorothiazide (HYDRODIURIL) 25 MG tablet Take 1 tablet by mouth daily.   [DISCONTINUED] levocetirizine (XYZAL) 5 MG tablet Take 1 tablet by mouth every evening.   [DISCONTINUED] omeprazole (PRILOSEC) 20 MG capsule Take 1 capsule by mouth twice daily before a meal.   [DISCONTINUED] oxybutynin (DITROPAN-XL) 5 MG 24 hr tablet TAKE 1 TABLET BY MOUTH EVERYDAY AT BEDTIME   [DISCONTINUED] simvastatin  (ZOCOR) 20 MG tablet Take 1 tablet by mouth daily.       01/23/2023    9:04 AM 11/03/2022    8:57 AM 07/09/2022    2:06 PM 01/21/2022    9:12 AM  GAD 7 : Generalized Anxiety Score  Nervous, Anxious, on Edge 0 0 0 0  Control/stop worrying 0 0 0 0  Worry too much - different things 0 0 0 0  Trouble relaxing 0 0 0 0  Restless 0 0 0 0  Easily annoyed or irritable 0 0 0 0  Afraid - awful might happen 0 0 0 0  Total GAD 7 Score 0 0 0 0  Anxiety Difficulty Not difficult at all Not difficult at all Not difficult at all Not difficult at all       01/23/2023    9:04 AM 11/03/2022    8:57 AM 07/09/2022    2:06 PM  Depression screen PHQ 2/9  Decreased Interest 0 0 0  Down, Depressed, Hopeless 0 0 0  PHQ - 2 Score 0 0 0  Altered sleeping 0 0 0  Tired, decreased energy 0 0 0  Change in appetite 0 0 0  Feeling bad or failure about yourself  0 0 0  Trouble concentrating 0 0 0  Moving slowly or fidgety/restless 0 0 0  Suicidal thoughts 0 0 0  PHQ-9 Score 0 0 0  Difficult doing work/chores Not difficult at all Not difficult at all Not difficult at all    BP Readings from Last 3 Encounters:  01/23/23 124/78  11/03/22 128/74  10/13/22 136/78    Physical Exam Vitals and nursing note reviewed.  Constitutional:      General: She is not in acute distress.    Appearance: She is well-developed.  HENT:     Head: Normocephalic and atraumatic.     Right Ear: Tympanic membrane and ear canal normal.     Left Ear: Tympanic membrane and ear canal normal.     Nose:     Right Sinus: No maxillary sinus tenderness.     Left Sinus: No maxillary sinus tenderness.  Eyes:     General: No scleral icterus.       Right eye: No discharge.        Left eye: No discharge.     Conjunctiva/sclera: Conjunctivae normal.  Neck:     Thyroid: No thyromegaly.     Vascular: No carotid bruit.  Cardiovascular:     Rate and Rhythm: Normal rate and regular rhythm.     Pulses: Normal pulses.     Heart sounds:  Normal heart sounds.  Pulmonary:     Effort: Pulmonary effort is normal. No respiratory distress.     Breath sounds: No wheezing.  Abdominal:     General: Bowel sounds are normal.     Palpations: Abdomen is soft.     Tenderness: There is no abdominal tenderness.  Musculoskeletal:     Cervical back: Normal range of motion. No erythema.     Right lower leg: No edema.     Left lower leg: No edema.  Lymphadenopathy:     Cervical: No cervical adenopathy.  Skin:    General: Skin is warm and dry.     Findings: No rash.  Neurological:     Mental Status: She is alert and oriented to person, place, and time.     Cranial Nerves: No cranial nerve deficit.     Sensory: No sensory deficit.     Deep Tendon Reflexes: Reflexes are normal and symmetric.  Psychiatric:        Attention and Perception: Attention normal.        Mood and Affect: Mood normal.     Wt Readings from Last 3 Encounters:  01/23/23 267 lb 3.2 oz (121.2 kg)  11/03/22 262 lb 12.8 oz (119.2 kg)  10/13/22 263 lb (119.3 kg)    BP 124/78   Pulse 84   Ht '5\' 4"'$  (1.626 m)   Wt 267 lb 3.2 oz (121.2 kg)   SpO2 95%   BMI 45.86 kg/m   Assessment and Plan: Problem List Items Addressed This Visit       Digestive   Gastroesophageal reflux disease    Symptoms well controlled on daily PPI No red flag signs such as weight loss, n/v, melena       Relevant Medications   omeprazole (PRILOSEC) 20 MG capsule   Other Relevant Orders   CBC with Differential/Platelet     Genitourinary   OAB (overactive bladder)    Symptoms well controlled on medications      Relevant Medications   oxybutynin (DITROPAN-XL) 5 MG 24 hr tablet  Other   Environmental and seasonal allergies   Relevant Medications   levocetirizine (XYZAL) 5 MG tablet   Localized edema    Controlled with hctz      Relevant Medications   hydrochlorothiazide (HYDRODIURIL) 25 MG tablet   Other Relevant Orders   Comprehensive metabolic panel   TSH    Mixed hyperlipidemia    Tolerating statin medications without concerns LDL is  Lab Results  Component Value Date   LDLCALC 102 (H) 01/21/2022  with a goal of < 70. Current dose will be adjusted if needed.       Relevant Medications   hydrochlorothiazide (HYDRODIURIL) 25 MG tablet   simvastatin (ZOCOR) 20 MG tablet   Other Relevant Orders   Lipid panel   Other Visit Diagnoses     Annual physical exam    -  Primary   Stable exam other than weight recent stress - mother with cancer now in NH continue efforts at healthy diet GYN annually   Relevant Orders   CBC with Differential/Platelet   Comprehensive metabolic panel   Hemoglobin A1c   Lipid panel   TSH   Encounter for screening mammogram for breast cancer       scheduled for July   Prediabetes       Relevant Orders   Hemoglobin A1c   BMI 45.0-49.9, adult ([place])   (Chronic)          Partially dictated using Editor, commissioning. Any errors are unintentional.  Halina Maidens, MD [facility]  01/23/2023

## 2023-01-24 ENCOUNTER — Encounter: Payer: Self-pay | Admitting: Internal Medicine

## 2023-01-24 LAB — LIPID PANEL
Chol/HDL Ratio: 2.7 ratio (ref 0.0–4.4)
Cholesterol, Total: 164 mg/dL (ref 100–199)
HDL: 61 mg/dL (ref >39)
LDL Chol Calc (NIH): 87 mg/dL (ref 0–99)
Triglycerides: 88 mg/dL (ref 0–149)
VLDL Cholesterol Cal: 16 mg/dL (ref 5–40)

## 2023-01-24 LAB — COMPREHENSIVE METABOLIC PANEL
ALT: 43 IU/L — ABNORMAL HIGH (ref 0–32)
AST: 51 IU/L — ABNORMAL HIGH (ref 0–40)
Albumin/Globulin Ratio: 1.5 (ref 1.2–2.2)
Albumin: 4 g/dL (ref 3.9–4.9)
Alkaline Phosphatase: 76 IU/L (ref 44–121)
BUN/Creatinine Ratio: 14 (ref 12–28)
BUN: 14 mg/dL (ref 8–27)
Bilirubin Total: 0.5 mg/dL (ref 0.0–1.2)
CO2: 23 mmol/L (ref 20–29)
Calcium: 8.9 mg/dL (ref 8.7–10.3)
Chloride: 106 mmol/L (ref 96–106)
Creatinine, Ser: 0.98 mg/dL (ref 0.57–1.00)
Globulin, Total: 2.6 g/dL (ref 1.5–4.5)
Glucose: 110 mg/dL — ABNORMAL HIGH (ref 70–99)
Potassium: 4.6 mmol/L (ref 3.5–5.2)
Sodium: 146 mmol/L — ABNORMAL HIGH (ref 134–144)
Total Protein: 6.6 g/dL (ref 6.0–8.5)
eGFR: 65 mL/min/{1.73_m2} (ref >59)

## 2023-01-24 LAB — CBC WITH DIFFERENTIAL/PLATELET
Basophils Absolute: 0 10*3/uL (ref 0.0–0.2)
Basos: 1 %
EOS (ABSOLUTE): 0.2 10*3/uL (ref 0.0–0.4)
Eos: 3 %
Hematocrit: 44.5 % (ref 34.0–46.6)
Hemoglobin: 14.3 g/dL (ref 11.1–15.9)
Immature Grans (Abs): 0 10*3/uL (ref 0.0–0.1)
Immature Granulocytes: 0 %
Lymphocytes Absolute: 1.6 10*3/uL (ref 0.7–3.1)
Lymphs: 30 %
MCH: 29.4 pg (ref 26.6–33.0)
MCHC: 32.1 g/dL (ref 31.5–35.7)
MCV: 91 fL (ref 79–97)
Monocytes Absolute: 0.5 10*3/uL (ref 0.1–0.9)
Monocytes: 9 %
Neutrophils Absolute: 3 10*3/uL (ref 1.4–7.0)
Neutrophils: 57 %
Platelets: 209 10*3/uL (ref 150–450)
RBC: 4.87 x10E6/uL (ref 3.77–5.28)
RDW: 13 % (ref 11.7–15.4)
WBC: 5.3 10*3/uL (ref 3.4–10.8)

## 2023-01-24 LAB — HEMOGLOBIN A1C
Est. average glucose Bld gHb Est-mCnc: 123 mg/dL
Hgb A1c MFr Bld: 5.9 % — ABNORMAL HIGH (ref 4.8–5.6)

## 2023-01-24 LAB — TSH: TSH: 0.85 u[IU]/mL (ref 0.450–4.500)

## 2023-02-08 ENCOUNTER — Encounter: Payer: Self-pay | Admitting: Internal Medicine

## 2023-02-09 ENCOUNTER — Ambulatory Visit: Payer: BC Managed Care – PPO | Admitting: Internal Medicine

## 2023-02-10 DIAGNOSIS — L400 Psoriasis vulgaris: Secondary | ICD-10-CM | POA: Diagnosis not present

## 2023-02-10 DIAGNOSIS — L300 Nummular dermatitis: Secondary | ICD-10-CM | POA: Diagnosis not present

## 2023-02-10 DIAGNOSIS — L821 Other seborrheic keratosis: Secondary | ICD-10-CM | POA: Diagnosis not present

## 2023-03-03 ENCOUNTER — Encounter: Payer: Self-pay | Admitting: Internal Medicine

## 2023-03-17 DIAGNOSIS — Z01419 Encounter for gynecological examination (general) (routine) without abnormal findings: Secondary | ICD-10-CM | POA: Diagnosis not present

## 2023-03-17 DIAGNOSIS — Z1231 Encounter for screening mammogram for malignant neoplasm of breast: Secondary | ICD-10-CM | POA: Diagnosis not present

## 2023-06-23 ENCOUNTER — Ambulatory Visit
Admission: RE | Admit: 2023-06-23 | Discharge: 2023-06-23 | Disposition: A | Discharge status: Home / Self Care | Payer: BC Managed Care – PPO | Source: Ambulatory Visit | Attending: Obstetrics and Gynecology | Admitting: Obstetrics and Gynecology

## 2023-06-23 DIAGNOSIS — Z1231 Encounter for screening mammogram for malignant neoplasm of breast: Secondary | ICD-10-CM | POA: Diagnosis not present

## 2023-06-24 ENCOUNTER — Encounter: Payer: Self-pay | Admitting: Internal Medicine

## 2023-06-24 NOTE — Telephone Encounter (Signed)
Please review.  KP

## 2023-07-21 ENCOUNTER — Other Ambulatory Visit
Admission: RE | Admit: 2023-07-21 | Discharge: 2023-07-21 | Disposition: A | Discharge status: Home / Self Care | Payer: BC Managed Care – PPO | Attending: Urology | Admitting: Urology

## 2023-07-21 ENCOUNTER — Encounter: Payer: Self-pay | Admitting: Urology

## 2023-07-21 ENCOUNTER — Ambulatory Visit: Payer: BC Managed Care – PPO | Admitting: Urology

## 2023-07-21 ENCOUNTER — Encounter: Payer: BC Managed Care – PPO | Admitting: Urology

## 2023-07-21 ENCOUNTER — Other Ambulatory Visit: Payer: Self-pay | Admitting: *Deleted

## 2023-07-21 VITALS — BP 193/88 | HR 73 | Ht 64.0 in | Wt 263.0 lb

## 2023-07-21 DIAGNOSIS — N3281 Overactive bladder: Secondary | ICD-10-CM

## 2023-07-21 LAB — URINALYSIS, COMPLETE (UACMP) WITH MICROSCOPIC
Bilirubin Urine: NEGATIVE
Glucose, UA: NEGATIVE mg/dL
Hgb urine dipstick: NEGATIVE
Ketones, ur: NEGATIVE mg/dL
Leukocytes,Ua: NEGATIVE
Nitrite: NEGATIVE
Protein, ur: NEGATIVE mg/dL
RBC / HPF: NONE SEEN RBC/hpf (ref 0–5)
Specific Gravity, Urine: 1.02 (ref 1.005–1.030)
pH: 5.5 (ref 5.0–8.0)

## 2023-07-21 LAB — BLADDER SCAN AMB NON-IMAGING

## 2023-07-21 MED ORDER — OXYBUTYNIN CHLORIDE ER 10 MG PO TB24: 10.0000 mg | ORAL_TABLET | Freq: Every day | ORAL | 11 refills | Status: DC

## 2023-07-21 NOTE — Patient Instructions (Addendum)
Minimize fluids 3 to 4 hours prior to bedtime, and will take the oxybutynin 4 to 6 weeks to see improvement  Overactive Bladder, Adult  Overactive bladder is a condition in which a person has a sudden and frequent need to urinate. A person might also leak urine if he or she cannot get to the bathroom fast enough (urinary incontinence). Sometimes, symptoms can interfere with work or social activities. What are the causes? Overactive bladder is associated with poor nerve signals between your bladder and your brain. Your bladder may get the signal to empty before it is full. You may also have very sensitive muscles that make your bladder squeeze too soon. This condition may also be caused by other factors, such as: Medical conditions: Urinary tract infection. Infection of nearby tissues. Prostate enlargement. Bladder stones, inflammation, or tumors. Diabetes. Muscle or nerve weakness, especially from these conditions: A spinal cord injury. Stroke. Multiple sclerosis. Parkinson's disease. Other causes: Surgery on the uterus or urethra. Drinking too much caffeine or alcohol. Certain medicines, especially those that eliminate extra fluid in the body (diuretics). Constipation. What increases the risk? You may be at greater risk for overactive bladder if you: Are an older adult. Smoke. Are going through menopause. Have prostate problems. Have a neurological disease, such as stroke, dementia, Parkinson's disease, or multiple sclerosis (MS). Eat or drink alcohol, spicy food, caffeine, and other things that irritate the bladder. Are overweight or obese. What are the signs or symptoms? Symptoms of this condition include a sudden, strong urge to urinate. Other symptoms include: Leaking urine. Urinating 8 or more times a day. Waking up to urinate 2 or more times overnight. How is this diagnosed? This condition may be diagnosed based on: Your symptoms and medical history. A physical  exam. Blood or urine tests to check for possible causes, such as infection. You may also need to see a health care provider who specializes in urinary tract problems. This is called a urologist. How is this treated? Treatment for overactive bladder depends on the cause of your condition and whether it is mild or severe. Treatment may include: Bladder training, such as: Learning to control the urge to urinate by following a schedule to urinate at regular intervals. Doing Kegel exercises to strengthen the pelvic floor muscles that support your bladder. Special devices, such as: Biofeedback. This uses sensors to help you become aware of your body's signals. Electrical stimulation. This uses electrodes placed inside the body (implanted) or outside the body. These electrodes send gentle pulses of electricity to strengthen the nerves or muscles that control the bladder. Women may use a plastic device, called a pessary, that fits into the vagina and supports the bladder. Medicines, such as: Antibiotics to treat bladder infection. Antispasmodics to stop the bladder from releasing urine at the wrong time. Tricyclic antidepressants to relax bladder muscles. Injections of botulinum toxin type A directly into the bladder tissue to relax bladder muscles. Surgery, such as: A device may be implanted to help manage the nerve signals that control urination. An electrode may be implanted to stimulate electrical signals in the bladder. A procedure may be done to change the shape of the bladder. This is done only in very severe cases. Follow these instructions at home: Eating and drinking  Make diet or lifestyle changes recommended by your health care provider. These may include: Drinking fluids throughout the day and not only with meals. Cutting down on caffeine or alcohol. Avoid diet drinks and artificial sweeteners Eating a healthy and  balanced diet to prevent constipation. This may include: Choosing  foods that are high in fiber, such as beans, whole grains, and fresh fruits and vegetables. Limiting foods that are high in fat and processed sugars, such as fried and sweet foods. Lifestyle  Lose weight if needed. Do not use any products that contain nicotine or tobacco. These include cigarettes, chewing tobacco, and vaping devices, such as e-cigarettes. If you need help quitting, ask your health care provider. General instructions Take over-the-counter and prescription medicines only as told by your health care provider. If you were prescribed an antibiotic medicine, take it as told by your health care provider. Do not stop taking the antibiotic even if you start to feel better. Use any implants or pessary as told by your health care provider. If needed, wear pads to absorb urine leakage. Keep a log to track how much and when you drink, and when you need to urinate. This will help your health care provider monitor your condition. Keep all follow-up visits. This is important. Contact a health care provider if: You have a fever or chills. Your symptoms do not get better with treatment. Your pain and discomfort get worse. You have more frequent urges to urinate. Get help right away if: You are not able to control your bladder. Summary Overactive bladder refers to a condition in which a person has a sudden and frequent need to urinate. Several conditions may lead to an overactive bladder. Treatment for overactive bladder depends on the cause and severity of your condition. Making lifestyle changes, doing Kegel exercises, keeping a log, and taking medicines can help with this condition. This information is not intended to replace advice given to you by your health care provider. Make sure you discuss any questions you have with your health care provider. Document Revised: 07/30/2020 Document Reviewed: 07/30/2020 Elsevier Patient Education  2024 [employer].

## 2023-07-21 NOTE — Progress Notes (Signed)
07/21/23 3:13 PM   Toni Kelly 1961-04-07 [id number]  CC: Overactive bladder, nocturia  HPI: 62 year old female with morbid obesity and BMI of 45 who reports at least a year of overactive urinary symptoms with urgency, frequency, urge incontinence, nocturia every 2 hours overnight.  She denies any dysuria, gross hematuria, recurrent UTIs.  She was prescribed 5 mg oxybutynin by PCP but only took a few doses of this.  She drinks diet flavored water during the day.  Urinalysis today is benign, PVR is normal at 0ml.  She denies any significant stress incontinence.   PMH: Past Medical History:  Diagnosis Date   Anal fistula 07/21/2013   Arthritis    lower back   Bursitis    Elbow tendonitis 10/31/2015   Esophageal reflux    Hemorrhoids 2011   HPV (human papilloma virus) infection    HSIL on Pap smear of cervix 05/23/2016   Hyperlipidemia    Hypertension     Surgical History: Past Surgical History:  Procedure Laterality Date   ABDOMINAL HYSTERECTOMY  08/2018   abnormal pap   CERVICAL BIOPSY  W/ LOOP ELECTRODE EXCISION  05/2017   [facility]   COLONOSCOPY  2011   @ [facility]   COLONOSCOPY WITH PROPOFOL N/A 02/10/2020   Procedure: COLONOSCOPY WITH BIOPSY;  Surgeon: Wyline Mood, MD;  [facility];  Service: Endoscopy;  Laterality: N/A;  priority 4   hemorrhoids banding  2011   POLYPECTOMY N/A 02/10/2020   Procedure: POLYPECTOMY;  Surgeon: Wyline Mood, MD;  Location: [facility];  Service: Endoscopy;  Laterality: N/A;   TONSILLECTOMY     VAGINECTOMY, PARTIAL  09/2020   negative for dysplasia or malignancy    Family History: Family History  Problem Relation Age of Onset   Heart failure Mother    Breast cancer Maternal Aunt 41   Lung cancer Maternal Aunt    Lung cancer Maternal Uncle    Lung cancer Maternal Uncle    Breast cancer Cousin 40       2 mat cousins    Social History:  reports that she has never smoked. She has never been exposed to tobacco  smoke. She has never used smokeless tobacco. She reports current alcohol use. She reports that she does not use drugs.  Physical Exam: BP (!) 193/88   Pulse 73   Ht 5\' 4"  (1.626 m)   Wt 263 lb (119.3 kg)   BMI 45.14 kg/m    Constitutional:  Alert and oriented, No acute distress. Cardiovascular: No clubbing, cyanosis, or edema. Respiratory: Normal respiratory effort, no increased work of breathing. GI: Abdomen is soft, nontender, nondistended, no abdominal masses  Assessment & Plan:    We discussed that overactive bladder (OAB) is not a disease, but is a symptom complex that is generally not life-threatening.  Symptoms typically include urinary urgency, frequency, and urge incontinence.  There are numerous treatment options, however there are risks and benefits with both medical and surgical management.  First-line treatment is behavioral therapies including bladder training, pelvic floor muscle training, and fluid management.  Second line treatments include oral antimuscarinics(Ditropan er, Trospium) and beta-3 agonist (Mybetriq). There is typically a period of medication trial (4-8 weeks) to find the optimal therapy and dosing. If symptoms are bothersome despite the above management, third line options include intra-detrusor botox, peripheral tibial nerve stimulation (PTNS), and interstim (SNS). These are more invasive treatments with higher side effect profile, but may improve quality of life for patients with severe OAB  symptoms.   Trial of oxybutynin 10 mg XL daily, avoid diet artificially sweetened drinks, minimize fluids prior to bedtime RTC 6 weeks PA symptom check, consider beta 3 agonist if no improvement on the oxybutynin and/or trial of topical estrogen cream   Legrand Rams, MD 07/21/2023  [facility] [address] [place], [place] [address] [phone]

## 2023-08-03 ENCOUNTER — Encounter: Payer: Self-pay | Admitting: Internal Medicine

## 2023-08-03 ENCOUNTER — Telehealth (INDEPENDENT_AMBULATORY_CARE_PROVIDER_SITE_OTHER): Payer: BC Managed Care – PPO | Admitting: Internal Medicine

## 2023-08-03 VITALS — Temp 98.7°F | Ht 64.0 in | Wt 263.0 lb

## 2023-08-03 DIAGNOSIS — J40 Bronchitis, not specified as acute or chronic: Secondary | ICD-10-CM

## 2023-08-03 MED ORDER — AMOXICILLIN-POT CLAVULANATE 875-125 MG PO TABS
1.0000 | ORAL_TABLET | Freq: Two times a day (BID) | ORAL | 0 refills | Status: AC
Start: 2023-08-03 — End: 2023-08-13

## 2023-08-03 NOTE — Patient Instructions (Addendum)
Mucinex - plain or DM - take as directed.  Take Tylenol every 6 hours if needed for fever, body aches, headache.

## 2023-08-03 NOTE — Progress Notes (Signed)
Date:  08/03/2023   Name:  Toni Kelly   DOB:  01/08/1961   MRN:  [id number]  This encounter was conducted via video encounter. This platform was deemed appropriate for the issues to be addressed.  The patient was correctly identified.  I advised that I am conducting the visit from a secure room in my office at [facility] clinic.  The patient is located home. The limitations of this form of encounter were discussed with the patient and he/she agreed to proceed.  Some vital signs will be absent.  Chief Complaint: Cough (Headache, Temperature 100.5 lastnight. Cough- green production. Patient just came back from [facility] Friday. Patient does not have any covid tests to test herself at home.)  Cough This is a new problem. The current episode started yesterday. The cough is Productive of sputum. Associated symptoms include chest pain, a fever, nasal congestion and postnasal drip.    Lab Results  Component Value Date   NA 146 (H) 01/23/2023   K 4.6 01/23/2023   CO2 23 01/23/2023   GLUCOSE 110 (H) 01/23/2023   BUN 14 01/23/2023   CREATININE 0.98 01/23/2023   CALCIUM 8.9 01/23/2023   EGFR 65 01/23/2023   GFRNONAA 57 (L) 01/17/2021   Lab Results  Component Value Date   CHOL 164 01/23/2023   HDL 61 01/23/2023   LDLCALC 87 01/23/2023   TRIG 88 01/23/2023   CHOLHDL 2.7 01/23/2023   Lab Results  Component Value Date   TSH 0.850 01/23/2023   Lab Results  Component Value Date   HGBA1C 5.9 (H) 01/23/2023   Lab Results  Component Value Date   WBC 5.3 01/23/2023   HGB 14.3 01/23/2023   HCT 44.5 01/23/2023   MCV 91 01/23/2023   PLT 209 01/23/2023   Lab Results  Component Value Date   ALT 43 (H) 01/23/2023   AST 51 (H) 01/23/2023   ALKPHOS 76 01/23/2023   BILITOT 0.5 01/23/2023   No results found for: "25OHVITD2", "25OHVITD3", "VD25OH"   Review of Systems  Constitutional:  Positive for fever.  HENT:  Positive for postnasal drip.   Respiratory:  Positive for cough.    Cardiovascular:  Positive for chest pain.    Patient Active Problem List   Diagnosis Date Noted   OAB (overactive bladder) 07/09/2022   Environmental and seasonal allergies 03/07/2022   S/P total hysterectomy 01/15/2021   Vaginal dysplasia 12/03/2020   Tubular adenoma of colon 02/13/2020   Morbid obesity with BMI of 40.0-44.9, adult (HCC) 08/10/2019   Constipation 12/13/2018   Abnormal EKG 09/14/2018   DDD (degenerative disc disease), lumbar 08/26/2018   Gastroesophageal reflux disease 01/14/2018   Localized edema 10/31/2015   Mixed hyperlipidemia 09/28/2015    Allergies  Allergen Reactions   Codeine Itching   Oxycodone Itching   Ultram [Tramadol] Itching    Past Surgical History:  Procedure Laterality Date   ABDOMINAL HYSTERECTOMY  08/2018   abnormal pap   CERVICAL BIOPSY  W/ LOOP ELECTRODE EXCISION  05/2017   [facility]   COLONOSCOPY  2011   @ [facility]   COLONOSCOPY WITH PROPOFOL N/A 02/10/2020   Procedure: COLONOSCOPY WITH BIOPSY;  Surgeon: Wyline Mood, MD;  Location: [facility];  Service: Endoscopy;  Laterality: N/A;  priority 4   hemorrhoids banding  2011   POLYPECTOMY N/A 02/10/2020   Procedure: POLYPECTOMY;  Surgeon: Wyline Mood, MD;  Location: [facility];  Service: Endoscopy;  Laterality: N/A;   TONSILLECTOMY  VAGINECTOMY, PARTIAL  09/2020   negative for dysplasia or malignancy    Social History   Tobacco Use   Smoking status: Never    Passive exposure: Never   Smokeless tobacco: Never  Vaping Use   Vaping status: Never Used  Substance Use Topics   Alcohol use: Yes    Alcohol/week: 0.0 standard drinks of alcohol    Comment: rarely   Drug use: No     Medication list has been reviewed and updated.  Current Meds  Medication Sig   amoxicillin-clavulanate (AUGMENTIN) 875-125 MG tablet Take 1 tablet by mouth 2 (two) times daily for 10 days.   hydrochlorothiazide (HYDRODIURIL) 25 MG tablet Take 1 tablet (25 mg total) by mouth daily.    levocetirizine (XYZAL) 5 MG tablet Take 1 tablet (5 mg total) by mouth every evening.   Multiple Vitamins-Minerals (WOMENS 50+ MULTI VITAMIN/MIN PO) Take 1 tablet by mouth.   Omega-3 Fatty Acids (FISH OIL PO) Take 1 capsule by mouth daily.    omeprazole (PRILOSEC) 20 MG capsule Take 1 capsule by mouth twice daily before a meal.   oxybutynin (DITROPAN-XL) 10 MG 24 hr tablet Take 1 tablet (10 mg total) by mouth daily.   simvastatin (ZOCOR) 20 MG tablet Take 1 tablet (20 mg total) by mouth daily.       08/03/2023    9:04 AM 01/23/2023    9:04 AM 11/03/2022    8:57 AM 07/09/2022    2:06 PM  GAD 7 : Generalized Anxiety Score  Nervous, Anxious, on Edge 0 0 0 0  Control/stop worrying 0 0 0 0  Worry too much - different things 0 0 0 0  Trouble relaxing 0 0 0 0  Restless 0 0 0 0  Easily annoyed or irritable 0 0 0 0  Afraid - awful might happen 0 0 0 0  Total GAD 7 Score 0 0 0 0  Anxiety Difficulty Not difficult at all Not difficult at all Not difficult at all Not difficult at all       08/03/2023    9:04 AM 01/23/2023    9:04 AM 11/03/2022    8:57 AM  Depression screen PHQ 2/9  Decreased Interest 0 0 0  Down, Depressed, Hopeless 0 0 0  PHQ - 2 Score 0 0 0  Altered sleeping 0 0 0  Tired, decreased energy 0 0 0  Change in appetite 0 0 0  Feeling bad or failure about yourself  0 0 0  Trouble concentrating 0 0 0  Moving slowly or fidgety/restless 0 0 0  Suicidal thoughts 0 0 0  PHQ-9 Score 0 0 0  Difficult doing work/chores Not difficult at all Not difficult at all Not difficult at all    BP Readings from Last 3 Encounters:  07/21/23 (!) 193/88  01/23/23 124/78  11/03/22 128/74    Physical Exam Constitutional:      Appearance: Normal appearance.  Pulmonary:     Effort: Pulmonary effort is normal.     Comments: No cough or dyspnea noted during the call Neurological:     Mental Status: She is alert.  Psychiatric:        Attention and Perception: Attention normal.        Mood and  Affect: Mood normal.        Speech: Speech normal.        Cognition and Memory: Cognition normal.     Wt Readings from Last 3 Encounters:  08/03/23 263 lb (  119.3 kg)  07/21/23 263 lb (119.3 kg)  01/23/23 267 lb 3.2 oz (121.2 kg)    Temp 98.7 F (37.1 C) (Oral)   Ht 5\' 4"  (1.626 m)   Wt 263 lb (119.3 kg)   BMI 45.14 kg/m   Assessment and Plan:  Problem List Items Addressed This Visit   None Visit Diagnoses     Bronchitis    -  Primary   Take Mucinex and Tylenol as needed push fluids follow up if needed   Relevant Medications   amoxicillin-clavulanate (AUGMENTIN) 875-125 MG tablet       No follow-ups on file.  I spent 8 minutes on the encounter, 100% by video.  Reubin Milan, MD [facility] [place]

## 2023-08-07 ENCOUNTER — Ambulatory Visit: Payer: Self-pay

## 2023-08-07 NOTE — Telephone Encounter (Signed)
Chief Complaint: COVID positive Symptoms: None at this time, had virtual visit on Monday and prescribed medication Frequency: N/A Pertinent Negatives: Patient denies symptoms Disposition: [] ED /[] Urgent Care (no appt availability in office) / [] Appointment(In office/virtual)/ []  [place] [facility]/ [x] Home Care/ [] Refused Recommended Disposition /[] [facility]/ []  Follow-up with PCP Additional Notes: N/A    Summary: COVID Advice   Pt is calling to report that she took a in home covid test today and tested postive for COVID. Was seen of video visit on 08/03/23. Reporting no taste today      Reason for Disposition  [1] COVID-19 diagnosed by positive lab test (e.g., PCR, rapid self-test kit) AND [2] NO symptoms (e.g., cough, fever, others)  Answer Assessment - Initial Assessment Questions 1. COVID-19 DIAGNOSIS: "How do you know that you have COVID?" (e.g., positive lab test or self-test, diagnosed by doctor or NP/PA, symptoms after exposure).     Home test today 2. COVID-19 EXPOSURE: "Was there any known exposure to COVID before the symptoms began?" CDC Definition of close contact: within 6 feet (2 meters) for a total of 15 minutes or more over a 24-hour period.      In [place] last week, so unknown of a known exposure 3. ONSET: "When did the COVID-19 symptoms start?"      Saturday 4. WORST SYMPTOM: "What is your worst symptom?" (e.g., cough, fever, shortness of breath, muscle aches)     Nothing really, had a virtual visit with provider on Monday 5. COUGH: "Do you have a cough?" If Yes, ask: "How bad is the cough?"       No 6. FEVER: "Do you have a fever?" If Yes, ask: "What is your temperature, how was it measured, and when did it start?"     No 7. RESPIRATORY STATUS: "Describe your breathing?" (e.g., normal; shortness of breath, wheezing, unable to speak)      No 8. BETTER-SAME-WORSE: "Are you getting better, staying the same or getting worse compared to yesterday?"   If getting worse, ask, "In what way?"     Better 9. OTHER SYMPTOMS: "Do you have any other symptoms?"  (e.g., chills, fatigue, headache, loss of smell or taste, muscle pain, sore throat)     Loss of taste  Protocols used: Coronavirus (COVID-19) Diagnosed or Suspected-A-AH

## 2023-08-28 NOTE — Progress Notes (Unsigned)
08/31/2023 3:09 PM   Toni Kelly 03/26/1961 [id number]  Referring provider: Reubin Milan, MD [address] [place],  [place] [address]  Urological history: 1. OAB -contributing factors of age, GSM, Obesity, HTN, lumbar DDD, Edema, hysterectomy, alcohol consumption and artificial sweeteners  2. Urge incontinence -contributing factors of age, GSM, Arthritis, Obesity, HTN, lumbar DDD, Edema, hysterectomy, alcohol consumption and artificial sweeteners  3. Nocturia -contributing factors of age, sleep apnea ***, HTN, Edema and Arthritis  No chief complaint on file.  HPI: Toni Kelly is a 62 y.o. female who presents today for follow up after a trial of oxybutynin XL 10 mg daily for OAB, Urge incontinence and nocturia.   Previous records reviewed.   She was seen by Dr. Richardo Hanks on 07/21/2023 for the complaints of OAB, urge incontience and nocturia x 1 year.   She was advised about artificial sweeteners contributing to her bladder symptoms and started on oxybutynin XL 10 mg daily.    PVR demonstrates adequate emptying.      PMH: Past Medical History:  Diagnosis Date   Anal fistula 07/21/2013   Arthritis    lower back   Bursitis    Elbow tendonitis 10/31/2015   Esophageal reflux    Hemorrhoids 2011   HPV (human papilloma virus) infection    HSIL on Pap smear of cervix 05/23/2016   Hyperlipidemia    Hypertension     Surgical History: Past Surgical History:  Procedure Laterality Date   ABDOMINAL HYSTERECTOMY  08/2018   abnormal pap   CERVICAL BIOPSY  W/ LOOP ELECTRODE EXCISION  05/2017   [facility]   COLONOSCOPY  2011   @ [facility]   COLONOSCOPY WITH PROPOFOL N/A 02/10/2020   Procedure: COLONOSCOPY WITH BIOPSY;  Surgeon: Wyline Mood, MD;  Location: [facility];  Service: Endoscopy;  Laterality: N/A;  priority 4   hemorrhoids banding  2011   POLYPECTOMY N/A 02/10/2020   Procedure: POLYPECTOMY;  Surgeon: Wyline Mood, MD;  Location: [facility];  Service: Endoscopy;  Laterality: N/A;   TONSILLECTOMY     VAGINECTOMY, PARTIAL  09/2020   negative for dysplasia or malignancy    Home Medications:  Allergies as of 08/31/2023       Reactions   Codeine Itching   Oxycodone Itching   Ultram [tramadol] Itching        Medication List        Accurate as of August 28, 2023  3:09 PM. If you have any questions, ask your nurse or doctor.          FISH OIL PO Take 1 capsule by mouth daily.   hydrochlorothiazide 25 MG tablet Commonly known as: HYDRODIURIL Take 1 tablet (25 mg total) by mouth daily.   levocetirizine 5 MG tablet Commonly known as: XYZAL Take 1 tablet (5 mg total) by mouth every evening.   omeprazole 20 MG capsule Commonly known as: PRILOSEC Take 1 capsule by mouth twice daily before a meal.   oxybutynin 10 MG 24 hr tablet Commonly known as: DITROPAN-XL Take 1 tablet (10 mg total) by mouth daily.   simvastatin 20 MG tablet Commonly known as: ZOCOR Take 1 tablet (20 mg total) by mouth daily.   WOMENS 50+ MULTI VITAMIN/MIN PO Take 1 tablet by mouth.        Allergies:  Allergies  Allergen Reactions   Codeine Itching   Oxycodone Itching   Ultram [Tramadol] Itching    Family History: Family History  Problem  Relation Age of Onset   Heart failure Mother    Breast cancer Maternal Aunt 31   Lung cancer Maternal Aunt    Lung cancer Maternal Uncle    Lung cancer Maternal Uncle    Breast cancer Cousin 40       2 mat cousins    Social History:  reports that she has never smoked. She has never been exposed to tobacco smoke. She has never used smokeless tobacco. She reports current alcohol use. She reports that she does not use drugs.  ROS: Pertinent ROS in HPI  Physical Exam: There were no vitals taken for this visit.  Constitutional:  Well nourished. Alert and oriented, No acute distress. HEENT: [place] AT, moist mucus membranes.  Trachea midline, no masses. Cardiovascular: No clubbing,  cyanosis, or edema. Respiratory: Normal respiratory effort, no increased work of breathing. GU: No CVA tenderness.  No bladder fullness or masses.  Recession of labia minora, dry, pale vulvar vaginal mucosa and loss of mucosal ridges and folds.  Normal urethral meatus, no lesions, no prolapse, no discharge.   No urethral masses, tenderness and/or tenderness. No bladder fullness, tenderness or masses. *** vagina mucosa, *** estrogen effect, no discharge, no lesions, *** pelvic support, *** cystocele and *** rectocele noted.  No cervical motion tenderness.  Uterus is freely mobile and non-fixed.  No adnexal/parametria masses or tenderness noted.  Anus and perineum are without rashes or lesions.   ***  Neurologic: Grossly intact, no focal deficits, moving all 4 extremities. Psychiatric: Normal mood and affect.    Laboratory Data: Lab Results  Component Value Date   WBC 5.3 01/23/2023   HGB 14.3 01/23/2023   HCT 44.5 01/23/2023   MCV 91 01/23/2023   PLT 209 01/23/2023   Lab Results  Component Value Date   CREATININE 0.98 01/23/2023   Lab Results  Component Value Date   HGBA1C 5.9 (H) 01/23/2023    Lab Results  Component Value Date   TSH 0.850 01/23/2023      Component Value Date/Time   CHOL 164 01/23/2023 0948   HDL 61 01/23/2023 0948   CHOLHDL 2.7 01/23/2023 0948   LDLCALC 87 01/23/2023 0948   Lab Results  Component Value Date   AST 51 (H) 01/23/2023   Lab Results  Component Value Date   ALT 43 (H) 01/23/2023    Urinalysis    Component Value Date/Time   COLORURINE YELLOW 07/21/2023 1437   APPEARANCEUR CLEAR 07/21/2023 1437   LABSPEC 1.020 07/21/2023 1437   PHURINE 5.5 07/21/2023 1437   GLUCOSEU NEGATIVE 07/21/2023 1437   HGBUR NEGATIVE 07/21/2023 1437   BILIRUBINUR NEGATIVE 07/21/2023 1437   BILIRUBINUR neg 07/09/2022 1410   KETONESUR NEGATIVE 07/21/2023 1437   PROTEINUR NEGATIVE 07/21/2023 1437   UROBILINOGEN 0.2 07/09/2022 1410   NITRITE NEGATIVE 07/21/2023  1437   LEUKOCYTESUR NEGATIVE 07/21/2023 1437  I have reviewed the labs.   Pertinent Imaging: ***  Assessment & Plan:  ***  1. OAB ***  2. Urge incontinence ***  3. Nocturia  - I explained to the patient that nocturia is often multi-factorial and difficult to treat.  Sleeping disorders, heart conditions, peripheral vascular disease, diabetes, an enlarged prostate for men, an urethral stricture causing bladder outlet obstruction and/or certain medications can contribute to nocturia.  - I have suggested that the patient avoid caffeine after noon and alcohol in the evening.  He or she may also benefit from fluid restrictions after 6:00 in the evening and voiding just prior to  bedtime.  - I have explained that research studies have showed that over 84% of patients with sleep apnea reported frequent nighttime urination.   With sleep apnea, oxygen decreases, carbon dioxide increases, the blood become more acidic, the heart rate drops and blood vessels in the lung constrict.  The body is then alerted that something is very wrong. The sleeper must wake enough to reopen the airway. By this time, the heart is racing and experiences a false signal of fluid overload. The heart excretes a hormone-like protein that tells the body to get rid of sodium and water, resulting in nocturia.  -  I also informed the patient that a recent study noted that decreasing sodium intake to 2.3 grams daily, if they don't have issues with hyponatremia, can also reduce the number of nightly voids  - There is also an increased incidence in sleep apnea with menopause, symptoms include night sweats, daytime sleepiness, depressed mood, and cognitive complaints like poor concentration or problems with short-term memory ***  - The patient may benefit from a discussion with his or her primary care physician to see if he or she has risk factors for sleep apnea or other sleep disturbances and obtaining a sleep study.  Restricting  fluids in the evening (especially caffeinated beverages). Taking diuretic medication in the morning or at least six hours before bedtime. Taking afternoon naps. Naps allow your bloodstream to absorb liquid, meaning you'll need to use the bathroom after a nap. This could reduce your trips to the bathroom at night. Elevating your legs while you're sitting at home. This helps with fluid distribution. Pelvic floor physical therapy to strengthen your pelvic floor muscles. Wearing compression stockings, which also helps with fluid distribution.   No follow-ups on file.  These notes generated with voice recognition software. I apologize for typographical errors.  Cloretta Ned  [facility] [address]  [address] [place], [place] [address] [phone]

## 2023-08-31 ENCOUNTER — Ambulatory Visit (INDEPENDENT_AMBULATORY_CARE_PROVIDER_SITE_OTHER): Payer: BC Managed Care – PPO | Admitting: Urology

## 2023-08-31 ENCOUNTER — Encounter: Payer: Self-pay | Admitting: Urology

## 2023-08-31 VITALS — BP 164/79 | HR 73 | Ht 64.0 in | Wt 263.0 lb

## 2023-08-31 DIAGNOSIS — R351 Nocturia: Secondary | ICD-10-CM

## 2023-08-31 DIAGNOSIS — N3941 Urge incontinence: Secondary | ICD-10-CM

## 2023-08-31 DIAGNOSIS — N3281 Overactive bladder: Secondary | ICD-10-CM | POA: Diagnosis not present

## 2023-08-31 LAB — BLADDER SCAN AMB NON-IMAGING

## 2023-08-31 MED ORDER — OXYBUTYNIN CHLORIDE ER 10 MG PO TB24
10.0000 mg | ORAL_TABLET | Freq: Every day | ORAL | 3 refills | Status: DC
Start: 2023-08-31 — End: 2023-12-09

## 2023-09-03 ENCOUNTER — Encounter: Payer: Self-pay | Admitting: Internal Medicine

## 2023-09-04 ENCOUNTER — Ambulatory Visit: Payer: BC Managed Care – PPO | Admitting: Internal Medicine

## 2023-09-22 ENCOUNTER — Telehealth: Payer: Self-pay

## 2023-09-22 NOTE — Telephone Encounter (Signed)
PA completed waiting on insurance approval.  Key: [id number]  KP

## 2023-09-29 NOTE — Telephone Encounter (Signed)
Denied   Pt needs to get OTC.  KP

## 2023-10-12 DIAGNOSIS — H43813 Vitreous degeneration, bilateral: Secondary | ICD-10-CM | POA: Diagnosis not present

## 2023-10-12 DIAGNOSIS — H40003 Preglaucoma, unspecified, bilateral: Secondary | ICD-10-CM | POA: Diagnosis not present

## 2023-10-12 DIAGNOSIS — H2513 Age-related nuclear cataract, bilateral: Secondary | ICD-10-CM | POA: Diagnosis not present

## 2023-10-12 DIAGNOSIS — Z0100 Encounter for examination of eyes and vision without abnormal findings: Secondary | ICD-10-CM | POA: Diagnosis not present

## 2023-11-03 ENCOUNTER — Ambulatory Visit (INDEPENDENT_AMBULATORY_CARE_PROVIDER_SITE_OTHER): Payer: BC Managed Care – PPO | Admitting: Internal Medicine

## 2023-11-03 ENCOUNTER — Encounter: Payer: Self-pay | Admitting: Internal Medicine

## 2023-11-03 VITALS — BP 128/74 | HR 70 | Temp 97.9°F | Ht 64.0 in | Wt 265.0 lb

## 2023-11-03 DIAGNOSIS — J0140 Acute pansinusitis, unspecified: Secondary | ICD-10-CM | POA: Diagnosis not present

## 2023-11-03 MED ORDER — FLUTICASONE PROPIONATE 50 MCG/ACT NA SUSP: 2.0000 | Freq: Every day | NASAL | 1 refills | Status: DC

## 2023-11-03 MED ORDER — AZITHROMYCIN 250 MG PO TABS
ORAL_TABLET | ORAL | 0 refills | Status: AC
Start: 2023-11-03 — End: 2023-11-08

## 2023-11-03 NOTE — Progress Notes (Signed)
Date:  11/03/2023   Name:  Toni Kelly   DOB:  [birth year]   MRN:  [id number]   Chief Complaint: Cough (Started 3-4 weeks ago. Cough, sore throat, sinus pressure, and headache. Patient taking cefindir from cousin but its not helping. )  Cough This is a chronic problem. The current episode started 1 to 4 weeks ago. The problem has been unchanged. The problem occurs every few minutes. Cough characteristics: coughs up green phlegm. Associated symptoms include headaches, postnasal drip and a sore throat. Pertinent negatives include no chest pain, chills, ear congestion, ear pain, fever, heartburn, shortness of breath, sweats or wheezing. Nothing aggravates the symptoms. Treatments tried: course of Cefdinir with no benefit. There is no history of asthma or COPD.  Sore Throat  This is a new problem. The current episode started in the past 7 days. The problem has been gradually worsening. There has been no fever. Associated symptoms include coughing and headaches. Pertinent negatives include no ear pain, shortness of breath or trouble swallowing.    Review of Systems  Constitutional:  Negative for chills, fatigue and fever.  HENT:  Positive for postnasal drip, sinus pressure and sore throat. Negative for ear pain and trouble swallowing.   Respiratory:  Positive for cough. Negative for shortness of breath and wheezing.   Cardiovascular:  Negative for chest pain.  Gastrointestinal:  Negative for heartburn.  Neurological:  Positive for headaches.  Psychiatric/Behavioral:  Negative for sleep disturbance. The patient is not nervous/anxious.      Lab Results  Component Value Date   NA 146 (H) 01/23/2023   K 4.6 01/23/2023   CO2 23 01/23/2023   GLUCOSE 110 (H) 01/23/2023   BUN 14 01/23/2023   CREATININE 0.98 01/23/2023   CALCIUM 8.9 01/23/2023   EGFR 65 01/23/2023   GFRNONAA 57 (L) 01/17/2021   Lab Results  Component Value Date   CHOL 164 01/23/2023   HDL 61 01/23/2023   LDLCALC 87  01/23/2023   TRIG 88 01/23/2023   CHOLHDL 2.7 01/23/2023   Lab Results  Component Value Date   TSH 0.850 01/23/2023   Lab Results  Component Value Date   HGBA1C 5.9 (H) 01/23/2023   Lab Results  Component Value Date   WBC 5.3 01/23/2023   HGB 14.3 01/23/2023   HCT 44.5 01/23/2023   MCV 91 01/23/2023   PLT 209 01/23/2023   Lab Results  Component Value Date   ALT 43 (H) 01/23/2023   AST 51 (H) 01/23/2023   ALKPHOS 76 01/23/2023   BILITOT 0.5 01/23/2023   No results found for: "25OHVITD2", "25OHVITD3", "VD25OH"   Patient Active Problem List   Diagnosis Date Noted   OAB (overactive bladder) 07/09/2022   Environmental and seasonal allergies 03/07/2022   S/P total hysterectomy 01/15/2021   Vaginal dysplasia 12/03/2020   Tubular adenoma of colon 02/13/2020   Morbid obesity with BMI of 40.0-44.9, adult (HCC) 08/10/2019   Constipation 12/13/2018   Abnormal EKG 09/14/2018   DDD (degenerative disc disease), lumbar 08/26/2018   Gastroesophageal reflux disease 01/14/2018   Localized edema 10/31/2015   Mixed hyperlipidemia 09/28/2015    Allergies  Allergen Reactions   Codeine Itching   Oxycodone Itching   Ultram [Tramadol] Itching    Past Surgical History:  Procedure Laterality Date   ABDOMINAL HYSTERECTOMY  08/2018   abnormal pap   CERVICAL BIOPSY  W/ LOOP ELECTRODE EXCISION  05/2017   [facility]   COLONOSCOPY  2011   @ [facility]  COLONOSCOPY WITH PROPOFOL N/A 02/10/2020   Procedure: COLONOSCOPY WITH BIOPSY;  Surgeon: Wyline Mood, MD;  Location: [facility];  Service: Endoscopy;  Laterality: N/A;  priority 4   hemorrhoids banding  2011   POLYPECTOMY N/A 02/10/2020   Procedure: POLYPECTOMY;  Surgeon: Wyline Mood, MD;  Location: [facility];  Service: Endoscopy;  Laterality: N/A;   TONSILLECTOMY     VAGINECTOMY, PARTIAL  09/2020   negative for dysplasia or malignancy    Social History   Tobacco Use   Smoking status: Never    Passive exposure: Never    Smokeless tobacco: Never  Vaping Use   Vaping status: Never Used  Substance Use Topics   Alcohol use: Yes    Alcohol/week: 0.0 standard drinks of alcohol    Comment: rarely   Drug use: No     Medication list has been reviewed and updated.  Current Meds  Medication Sig   azithromycin (ZITHROMAX Z-PAK) 250 MG tablet UAD   fluticasone (FLONASE) 50 MCG/ACT nasal spray Place 2 sprays into both nostrils daily.   hydrochlorothiazide (HYDRODIURIL) 25 MG tablet Take 1 tablet (25 mg total) by mouth daily.   levocetirizine (XYZAL) 5 MG tablet Take 1 tablet (5 mg total) by mouth every evening.   Multiple Vitamins-Minerals (WOMENS 50+ MULTI VITAMIN/MIN PO) Take 1 tablet by mouth.   Omega-3 Fatty Acids (FISH OIL PO) Take 1 capsule by mouth daily.    omeprazole (PRILOSEC) 20 MG capsule Take 1 capsule by mouth twice daily before a meal.   oxybutynin (DITROPAN-XL) 10 MG 24 hr tablet Take 1 tablet (10 mg total) by mouth daily.   simvastatin (ZOCOR) 20 MG tablet Take 1 tablet (20 mg total) by mouth daily.       11/03/2023   11:17 AM 08/03/2023    9:04 AM 01/23/2023    9:04 AM 11/03/2022    8:57 AM  GAD 7 : Generalized Anxiety Score  Nervous, Anxious, on Edge 0 0 0 0  Control/stop worrying 0 0 0 0  Worry too much - different things 0 0 0 0  Trouble relaxing 0 0 0 0  Restless 0 0 0 0  Easily annoyed or irritable 0 0 0 0  Afraid - awful might happen 0 0 0 0  Total GAD 7 Score 0 0 0 0  Anxiety Difficulty Not difficult at all Not difficult at all Not difficult at all Not difficult at all       11/03/2023   11:17 AM 08/03/2023    9:04 AM 01/23/2023    9:04 AM  Depression screen PHQ 2/9  Decreased Interest 0 0 0  Down, Depressed, Hopeless 0 0 0  PHQ - 2 Score 0 0 0  Altered sleeping 0 0 0  Tired, decreased energy 0 0 0  Change in appetite 0 0 0  Feeling bad or failure about yourself  0 0 0  Trouble concentrating 0 0 0  Moving slowly or fidgety/restless 0 0 0  Suicidal thoughts 0 0 0   PHQ-9 Score 0 0 0  Difficult doing work/chores Not difficult at all Not difficult at all Not difficult at all    BP Readings from Last 3 Encounters:  11/03/23 128/74  08/31/23 (!) 164/79  07/21/23 (!) 193/88    Physical Exam Constitutional:      Appearance: She is well-developed.  HENT:     Right Ear: Ear canal and external ear normal. Tympanic membrane is not erythematous or retracted.  Left Ear: Ear canal and external ear normal. Tympanic membrane is not erythematous or retracted.     Nose:     Right Sinus: Maxillary sinus tenderness and frontal sinus tenderness present.     Left Sinus: Maxillary sinus tenderness and frontal sinus tenderness present.     Mouth/Throat:     Mouth: No oral lesions.     Pharynx: Uvula midline. Posterior oropharyngeal erythema present. No oropharyngeal exudate.  Cardiovascular:     Rate and Rhythm: Normal rate and regular rhythm.     Heart sounds: Normal heart sounds.  Pulmonary:     Effort: Pulmonary effort is normal.     Breath sounds: Normal breath sounds. No decreased breath sounds, wheezing, rhonchi or rales.  Musculoskeletal:     Cervical back: Normal range of motion.  Lymphadenopathy:     Cervical: No cervical adenopathy.  Neurological:     Mental Status: She is alert and oriented to person, place, and time.     Wt Readings from Last 3 Encounters:  11/03/23 265 lb (120.2 kg)  08/31/23 263 lb (119.3 kg)  08/03/23 263 lb (119.3 kg)    BP 128/74   Pulse 70   Temp 97.9 F (36.6 C) (Oral)   Ht 5\' 4"  (1.626 m)   Wt 265 lb (120.2 kg)   SpO2 98%   BMI 45.49 kg/m   Assessment and Plan:  Problem List Items Addressed This Visit   None Visit Diagnoses     Subacute pansinusitis    -  Primary   unresponsive to cephalosporin will add Flonase nasal spray and Zpak follow up if no improvement   Relevant Medications   azithromycin (ZITHROMAX Z-PAK) 250 MG tablet   fluticasone (FLONASE) 50 MCG/ACT nasal spray       No  follow-ups on file.    Reubin Milan, MD [facility] [place]

## 2023-11-11 ENCOUNTER — Ambulatory Visit: Payer: Self-pay | Admitting: *Deleted

## 2023-11-11 ENCOUNTER — Other Ambulatory Visit: Payer: Self-pay | Admitting: Internal Medicine

## 2023-11-11 ENCOUNTER — Encounter: Payer: Self-pay | Admitting: Internal Medicine

## 2023-11-11 DIAGNOSIS — J0140 Acute pansinusitis, unspecified: Secondary | ICD-10-CM

## 2023-11-11 MED ORDER — AMOXICILLIN-POT CLAVULANATE 875-125 MG PO TABS
1.0000 | ORAL_TABLET | Freq: Two times a day (BID) | ORAL | 0 refills | Status: AC
Start: 2023-11-11 — End: 2023-11-18

## 2023-11-11 NOTE — Telephone Encounter (Signed)
2nd attempt to return her call.   Left a voicemail to call back. 

## 2023-11-11 NOTE — Telephone Encounter (Signed)
Attempted to return her call.   Left a voicemail to call back. 

## 2023-11-11 NOTE — Telephone Encounter (Signed)
3rd attempt to return her call.   Left a message to call back.   Per policy after 3 attempts I have forwarded this to Dr. Karn Cassis office.

## 2023-11-11 NOTE — Telephone Encounter (Signed)
Message from Phill Myron sent at 11/11/2023 11:32 AM EST  Summary: coughing up green phlegm   Still coughing up green phlegm ,,, just finished a round of antibiotics. Would like to know if more could be called in.          Call History  Contact Date/Time Type Contact Phone/Fax By  11/11/2023 11:26 AM EST Phone (Incoming) Daliana, Varble (Self) [phone] Rexene Edison) Cory Munch

## 2023-11-11 NOTE — Telephone Encounter (Signed)
  Chief Complaint: Seen 11/03/23 and started on antibiotic. Still coughing up green mucus and green drainage from nose. Asking for additional antibiotic. Symptoms: Above Frequency: 2 weeks ago Pertinent Negatives: Patient denies fever Disposition: [] ED /[] Urgent Care (no appt availability in office) / [] Appointment(In office/virtual)/ []  [facility]/ [] Home Care/ [] Refused Recommended Disposition /[] [facility]/ [x]  Follow-up with PCP Additional Notes: Please advise pt.  Reason for Disposition . Cough has been present for > 3 weeks  Answer Assessment - Initial Assessment Questions 1. ONSET: "When did the cough begin?"      2 WEEKS AGO 2. SEVERITY: "How bad is the cough today?"      mILD 3. SPUTUM: "Describe the color of your sputum" (none, dry cough; clear, white, yellow, green)     Green 4. HEMOPTYSIS: "Are you coughing up any blood?" If so ask: "How much?" (flecks, streaks, tablespoons, etc.)     No 5. DIFFICULTY BREATHING: "Are you having difficulty breathing?" If Yes, ask: "How bad is it?" (e.g., mild, moderate, severe)    - MILD: No SOB at rest, mild SOB with walking, speaks normally in sentences, can lie down, no retractions, pulse < 100.    - MODERATE: SOB at rest, SOB with minimal exertion and prefers to sit, cannot lie down flat, speaks in phrases, mild retractions, audible wheezing, pulse 100-120.    - SEVERE: Very SOB at rest, speaks in single words, struggling to breathe, sitting hunched forward, retractions, pulse > 120      None 6. FEVER: "Do you have a fever?" If Yes, ask: "What is your temperature, how was it measured, and when did it start?"     None 7. CARDIAC HISTORY: "Do you have any history of heart disease?" (e.g., heart attack, congestive heart failure)      No 8. LUNG HISTORY: "Do you have any history of lung disease?"  (e.g., pulmonary embolus, asthma, emphysema)     No 9. PE RISK FACTORS: "Do you have a history of blood clots?" (or:  recent major surgery, recent prolonged travel, bedridden)     No 10. OTHER SYMPTOMS: "Do you have any other symptoms?" (e.g., runny nose, wheezing, chest pain)       Hoarse, Headache 11. PREGNANCY: "Is there any chance you are pregnant?" "When was your last menstrual period?"       No 12. TRAVEL: "Have you traveled out of the country in the last month?" (e.g., travel history, exposures)       No  Protocols used: Cough - Acute Productive-A-AH

## 2023-11-11 NOTE — Telephone Encounter (Signed)
Please review. Appt?  KP 

## 2023-11-23 ENCOUNTER — Other Ambulatory Visit: Payer: Self-pay | Admitting: Internal Medicine

## 2023-11-23 DIAGNOSIS — K219 Gastro-esophageal reflux disease without esophagitis: Secondary | ICD-10-CM

## 2023-11-27 ENCOUNTER — Encounter: Payer: Self-pay | Admitting: Internal Medicine

## 2023-11-27 ENCOUNTER — Other Ambulatory Visit: Payer: Self-pay | Admitting: Internal Medicine

## 2023-11-27 DIAGNOSIS — K219 Gastro-esophageal reflux disease without esophagitis: Secondary | ICD-10-CM

## 2023-11-27 MED ORDER — OMEPRAZOLE 20 MG PO CPDR
DELAYED_RELEASE_CAPSULE | ORAL | 3 refills | Status: DC
Start: 2023-11-27 — End: 2024-10-12

## 2023-11-27 NOTE — Telephone Encounter (Signed)
 Requested Prescriptions  Refused Prescriptions Disp Refills   omeprazole  (PRILOSEC) 20 MG capsule [Pharmacy Med Name: Omeprazole  20mg  Delayed-Release Capsule] 180 capsule 2    Sig: Take 1 capsule by mouth twice daily before a meal.     Gastroenterology: Proton Pump Inhibitors Passed - 11/27/2023 11:55 AM      Passed - Valid encounter within last 12 months    Recent Outpatient Visits           3 weeks ago Subacute pansinusitis   [place] [facility] at [facility], Leita DEL, MD   3 months ago Bronchitis   [place] [facility] at [facility], Leita DEL, MD   10 months ago Annual physical exam   [facility] at [facility], Leita DEL, MD   1 year ago Community acquired pneumonia of right upper lobe of lung   [place] [facility] at [facility], Leita DEL, MD   1 year ago Acute pharyngitis, unspecified etiology   [place] [facility] at [facility], Leita DEL, MD       Future Appointments             In 1 month Adie, Leita DEL, MD [facility] Primary Care & Sports Medicine at [facility], [facility]   In 3 months McGowan, [name] [facility] [place]

## 2023-12-09 ENCOUNTER — Encounter: Payer: Self-pay | Admitting: *Deleted

## 2023-12-09 ENCOUNTER — Other Ambulatory Visit: Payer: Self-pay | Admitting: Internal Medicine

## 2023-12-09 DIAGNOSIS — N3941 Urge incontinence: Secondary | ICD-10-CM

## 2023-12-09 DIAGNOSIS — R600 Localized edema: Secondary | ICD-10-CM

## 2023-12-09 DIAGNOSIS — R351 Nocturia: Secondary | ICD-10-CM

## 2023-12-09 DIAGNOSIS — N3281 Overactive bladder: Secondary | ICD-10-CM

## 2023-12-09 MED ORDER — OXYBUTYNIN CHLORIDE ER 10 MG PO TB24
10.0000 mg | ORAL_TABLET | Freq: Every day | ORAL | 3 refills | Status: DC
Start: 2023-12-09 — End: 2024-02-29

## 2023-12-10 NOTE — Telephone Encounter (Signed)
Requested Prescriptions  Pending Prescriptions Disp Refills   hydrochlorothiazide (HYDRODIURIL) 25 MG tablet [Pharmacy Med Name: Hydrochlorothiazide 25mg  Tablet] 90 tablet 0    Sig: Take 1 tablet by mouth daily.     Cardiovascular: Diuretics - Thiazide Failed - 12/10/2023 10:11 AM      Failed - Cr in normal range and within 180 days    Creatinine, Ser  Date Value Ref Range Status  01/23/2023 0.98 0.57 - 1.00 mg/dL Final         Failed - K in normal range and within 180 days    Potassium  Date Value Ref Range Status  01/23/2023 4.6 3.5 - 5.2 mmol/L Final         Failed - Na in normal range and within 180 days    Sodium  Date Value Ref Range Status  01/23/2023 146 (H) 134 - 144 mmol/L Final         Passed - Last BP in normal range    BP Readings from Last 1 Encounters:  11/03/23 128/74         Passed - Valid encounter within last 6 months    Recent Outpatient Visits           1 month ago Subacute pansinusitis   [place] [facility] at [facility], Nyoka Cowden, MD   4 months ago Bronchitis   [facility] at [facility], Nyoka Cowden, MD   10 months ago Annual physical exam   [facility] Primary Care & Sports Medicine at [facility], Nyoka Cowden, MD   1 year ago Community acquired pneumonia of right upper lobe of lung   [place] [facility] at [facility], Nyoka Cowden, MD   1 year ago Acute pharyngitis, unspecified etiology   [place] [facility] at [facility], Nyoka Cowden, MD       Future Appointments             In 1 month Judithann Graves, Nyoka Cowden, MD [facility] Primary Care & Sports Medicine at [facility], [facility]   In 2 months McGowan, Elana Alm [facility] [place]

## 2023-12-29 ENCOUNTER — Other Ambulatory Visit: Payer: Self-pay | Admitting: Obstetrics and Gynecology

## 2023-12-29 DIAGNOSIS — Z1231 Encounter for screening mammogram for malignant neoplasm of breast: Secondary | ICD-10-CM

## 2024-01-03 ENCOUNTER — Other Ambulatory Visit: Payer: Self-pay | Admitting: Internal Medicine

## 2024-01-03 DIAGNOSIS — J3089 Other allergic rhinitis: Secondary | ICD-10-CM

## 2024-01-03 DIAGNOSIS — N3281 Overactive bladder: Secondary | ICD-10-CM

## 2024-01-03 DIAGNOSIS — E782 Mixed hyperlipidemia: Secondary | ICD-10-CM

## 2024-01-04 NOTE — Telephone Encounter (Signed)
 Requested Prescriptions  Pending Prescriptions Disp Refills   levocetirizine (XYZAL ) 5 MG tablet [Pharmacy Med Name: Levocetirizine 5mg  Tablet] 90 tablet 0    Sig: Take 1 tablet by mouth every evening.     Ear, Nose, and Throat:  Antihistamines - levocetirizine dihydrochloride  Passed - 01/04/2024  5:50 PM      Passed - Cr in normal range and within 360 days    Creatinine, Ser  Date Value Ref Range Status  01/23/2023 0.98 0.57 - 1.00 mg/dL Final         Passed - eGFR is 10 or above and within 360 days    GFR calc Af Amer  Date Value Ref Range Status  01/17/2021 66 >59 mL/min/1.73 Final    Comment:    **In accordance with recommendations from the NKF-ASN Task force,**   [facility] is in the process of updating its eGFR calculation to the   2021 CKD-EPI creatinine equation that estimates kidney function   without a race variable.    GFR calc non Af Amer  Date Value Ref Range Status  01/17/2021 57 (L) >59 mL/min/1.73 Final   eGFR  Date Value Ref Range Status  01/23/2023 65 >59 mL/min/1.73 Final         Passed - Valid encounter within last 12 months    Recent Outpatient Visits           2 months ago Subacute pansinusitis   [place] [facility] at [facility], Chales [place], MD   5 months ago Bronchitis   [place] [facility] at [facility], Chales Colorado, MD   11 months ago Annual physical exam   [facility] at [facility], Chales [place], MD   1 year ago Community acquired pneumonia of right upper lobe of lung   [place] [facility] at [facility], Chales Colorado, MD   1 year ago Acute pharyngitis, unspecified etiology   [place] [facility] at [facility], Chales [place], MD       Future Appointments             In 3 weeks Gala Jubilee Chales [place], MD [facility] Primary Care [facility] Sports Medicine at [facility], [facility]   In  1 month McGowan, Nyra Bellis [facility] [place]             simvastatin  (ZOCOR ) 20 MG tablet [Pharmacy Med Name: Simvastatin  20mg  Tablet] 90 tablet 0    Sig: Take 1 tablet by mouth daily.     Cardiovascular:  Antilipid - Statins Failed - 01/04/2024  5:50 PM      Failed - Lipid Panel in normal range within the last 12 months    Cholesterol, Total  Date Value Ref Range Status  01/23/2023 164 100 - 199 mg/dL Final   LDL Chol Calc (NIH)  Date Value Ref Range Status  01/23/2023 87 0 - 99 mg/dL Final   HDL  Date Value Ref Range Status  01/23/2023 61 >39 mg/dL Final   Triglycerides  Date Value Ref Range Status  01/23/2023 88 0 - 149 mg/dL Final         Passed - Patient is not pregnant      Passed - Valid encounter within last 12 months    Recent Outpatient Visits           2 months ago Subacute pansinusitis  [facility] Medicine at [facility], Chales Colorado, MD   5 months ago Bronchitis   [facility] Care & Sports Medicine at [facility], Chales [place], MD   11 months ago Annual physical exam   [facility] at [facility], Chales [place], MD   1 year ago Community acquired pneumonia of right upper lobe of lung   [place] [facility] at [facility], Chales [place], MD   1 year ago Acute pharyngitis, unspecified etiology   [place] [facility] at [facility], Chales [place], MD       Future Appointments             In 3 weeks Gala Jubilee [place], MD [facility] Primary Care & Sports Medicine at [facility], [facility]   In 1 month McGowan, Danne Dustman, PA-C [facility] [place]            Refused Prescriptions Disp Refills   oxybutynin  (DITROPAN -XL) 5 MG 24 hr tablet [Pharmacy Med Name: Oxybutynin  Chloride 5mg  Extended-Release Tablet] 90 tablet 2    Sig: Take 1 tablet by mouth daily at bedtime.     Urology:   Bladder Agents Passed - 01/04/2024  5:50 PM      Passed - Valid encounter within last 12 months    Recent Outpatient Visits           2 months ago Subacute pansinusitis   [place] [facility] at [facility], Chales Colorado, MD   5 months ago Bronchitis   [facility] Care [facility] Sports Medicine at [facility], Chales [place], MD   11 months ago Annual physical exam   [facility] Medicine at [facility], Chales [place], MD   1 year ago Community acquired pneumonia of right upper lobe of lung   [facility] Sports Medicine at [facility], Chales [place], MD   1 year ago Acute pharyngitis, unspecified etiology   [place] [facility] at [facility], Chales [place], MD       Future Appointments             In 3 weeks Gala Jubilee [place], MD [facility] Primary Care & Sports Medicine at [facility], [facility]   In 1 month McGowan, Nyra Bellis [facility] [place]

## 2024-01-11 ENCOUNTER — Encounter: Payer: Self-pay | Admitting: Internal Medicine

## 2024-01-11 ENCOUNTER — Encounter: Payer: Self-pay | Admitting: Family Medicine

## 2024-01-11 ENCOUNTER — Telehealth: Payer: Self-pay | Admitting: Internal Medicine

## 2024-01-11 ENCOUNTER — Ambulatory Visit (INDEPENDENT_AMBULATORY_CARE_PROVIDER_SITE_OTHER): Payer: BC Managed Care – PPO | Admitting: Family Medicine

## 2024-01-11 VITALS — BP 126/84 | HR 81 | Temp 98.1°F | Ht 64.0 in | Wt 262.0 lb

## 2024-01-11 DIAGNOSIS — H60391 Other infective otitis externa, right ear: Secondary | ICD-10-CM

## 2024-01-11 DIAGNOSIS — H6090 Unspecified otitis externa, unspecified ear: Secondary | ICD-10-CM | POA: Insufficient documentation

## 2024-01-11 MED ORDER — CIPROFLOXACIN-DEXAMETHASONE 0.3-0.1 % OT SUSP
4.0000 [drp] | Freq: Two times a day (BID) | OTIC | 0 refills | Status: DC
Start: 2024-01-11 — End: 2024-01-21

## 2024-01-11 NOTE — Patient Instructions (Signed)
-   Use ear drops for full course - Review information attached - Contact for questions and follow-up as-needed

## 2024-01-11 NOTE — Telephone Encounter (Signed)
Pt received text message from pharmacy stating that the ciprofloxacin-dexamethasone (CIPRODEX) OTIC suspension  would be $196. Pt requesting cheaper alternative to be sent in instead.

## 2024-01-11 NOTE — Progress Notes (Signed)
Primary Care / Sports Medicine Office Visit  Patient Information:  Patient ID: Toni Kelly, female DOB: 11-Apr-1961 Age: 63 y.o. MRN: [id number]   Toni Kelly is a pleasant 62 y.o. female presenting with the following:  Chief Complaint  Patient presents with   Otalgia    X1 day, right ear, 6 pain scale, has not tried any medication, no drainage     Vitals:   01/11/24 1502  BP: 126/84  Pulse: 81  Temp: 98.1 F (36.7 C)  SpO2: 95%   Vitals:   01/11/24 1502  Weight: 262 lb (118.8 kg)  Height: 5\' 4"  (1.626 m)   Body mass index is 44.97 kg/m.  No results found.   Independent interpretation of notes and tests performed by another provider:   None  Procedures performed:   Indication: Cerumen impaction of the ear(s)  Medical necessity statement: On physical examination, cerumen impairs clinically significant portions of the external auditory canal, and tympanic membrane. Noted obstructive, copious cerumen that cannot be removed without magnification and instrumentations requiring physician skills Consent: Discussed benefits and risks of procedure and verbal consent obtained Procedure: Patient was prepped for the procedure. Utilized an otoscope to assess and take note of the ear canal, the tympanic membrane, and the presence, amount, and placement of the cerumen. Gentle water irrigation and soft plastic curette was utilized to remove cerumen.  Post procedure examination: shows cerumen was completely removed. Patient tolerated procedure well. The patient is made aware that they may experience temporary vertigo, temporary hearing loss, and temporary discomfort. If these symptom last for more than 24 hours to call the clinic or proceed to the ED.   Pertinent History, Exam, Impression, and Recommendations:   Problem List Items Addressed This Visit     Otitis externa - Primary   History of Present Illness Toni Kelly is a 63 year old female who presents with  right ear pain.  She experiences a dull pain in her right ear that began upon waking. The pain occurs every few minutes and is not deep. There is no change in pain with different activities or motions. No associated symptoms such as headaches, fevers, congestion, throat pain, or pain around the teeth, jaw, neck, or shoulders.  She uses headphones on her right ear regularly at work, where she talks on the phone all day. She has not been sick recently. She recalls going out to eat over the weekend for Valentine's Day. She has previously undergone ear irrigation.  Physical Exam HEENT: Left ear canal partially obstructed. Ear canals appear normal bilaterally. Throat normal. Following irrigation, noted TM intact, benign, canal mildly boggy erythematous   -Perform bilateral ear irrigation to clear cerumen and allow for better visualization of ear canal and eardrum.  Assessment and Plan Otitis Externa Ear canal infection likely due to retained Q-tip cotton in the ear canal. The ear canal is erythematous. No signs of TM involvement -Prescribe antibiotic ear drops. Use for the full course. -Advise against using Q-tips daily. Suggest reducing frequency to once a week or as needed. -Advise to ensure no cotton or other material is left in the ear after cleaning. -If symptoms persist or change, patient to send a message.  Ear Care Regular use of Q-tips leading to potential ear canal irritation and infection. -Advise to be mindful of Q-tip use and ensure no material is left in the ear. -Suggest possibly changing the brand of Q-tips or reducing the frequency of use. -Provide information on  ear care via MyChart.        Orders & Medications Medications:  Meds ordered this encounter  Medications   ciprofloxacin-dexamethasone (CIPRODEX) OTIC suspension    Sig: Place 4 drops into the right ear 2 (two) times daily for 10 days.    Dispense:  4 mL    Refill:  0   No orders of the defined types were  placed in this encounter.    No follow-ups on file.     Jerrol Banana, MD, [facility]   Primary Care Sports Medicine Primary Care and Sports Medicine at [facility]

## 2024-01-11 NOTE — Assessment & Plan Note (Signed)
History of Present Illness Toni Kelly is a 63 year old female who presents with right ear pain.  She experiences a dull pain in her right ear that began upon waking. The pain occurs every few minutes and is not deep. There is no change in pain with different activities or motions. No associated symptoms such as headaches, fevers, congestion, throat pain, or pain around the teeth, jaw, neck, or shoulders.  She uses headphones on her right ear regularly at work, where she talks on the phone all day. She has not been sick recently. She recalls going out to eat over the weekend for Valentine's Day. She has previously undergone ear irrigation.  Physical Exam HEENT: Left ear canal partially obstructed. Ear canals appear normal bilaterally. Throat normal. Following irrigation, noted TM intact, benign, canal mildly boggy erythematous   -Perform bilateral ear irrigation to clear cerumen and allow for better visualization of ear canal and eardrum.  Assessment and Plan Otitis Externa Ear canal infection likely due to retained Q-tip cotton in the ear canal. The ear canal is erythematous. No signs of TM involvement -Prescribe antibiotic ear drops. Use for the full course. -Advise against using Q-tips daily. Suggest reducing frequency to once a week or as needed. -Advise to ensure no cotton or other material is left in the ear after cleaning. -If symptoms persist or change, patient to send a message.  Ear Care Regular use of Q-tips leading to potential ear canal irritation and infection. -Advise to be mindful of Q-tip use and ensure no material is left in the ear. -Suggest possibly changing the brand of Q-tips or reducing the frequency of use. -Provide information on ear care via MyChart.

## 2024-01-12 ENCOUNTER — Other Ambulatory Visit: Payer: Self-pay | Admitting: Family Medicine

## 2024-01-12 ENCOUNTER — Ambulatory Visit: Payer: BC Managed Care – PPO | Admitting: Family Medicine

## 2024-01-12 MED ORDER — NEOMYCIN-POLYMYXIN-HC 1 % OT SOLN: 4.0000 [drp] | Freq: Three times a day (TID) | OTIC | 0 refills | Status: AC

## 2024-01-12 NOTE — Telephone Encounter (Signed)
Called pt she stated she got the first medication with using [facility].   KP

## 2024-01-25 ENCOUNTER — Other Ambulatory Visit
Admission: RE | Admit: 2024-01-25 | Discharge: 2024-01-25 | Disposition: A | Discharge status: Home / Self Care | Attending: Internal Medicine | Admitting: Internal Medicine

## 2024-01-25 ENCOUNTER — Encounter: Payer: Self-pay | Admitting: Internal Medicine

## 2024-01-25 ENCOUNTER — Ambulatory Visit (INDEPENDENT_AMBULATORY_CARE_PROVIDER_SITE_OTHER): Payer: BC Managed Care – PPO | Admitting: Internal Medicine

## 2024-01-25 VITALS — BP 126/82 | HR 76 | Ht 64.0 in | Wt 259.2 lb

## 2024-01-25 DIAGNOSIS — Z1231 Encounter for screening mammogram for malignant neoplasm of breast: Secondary | ICD-10-CM

## 2024-01-25 DIAGNOSIS — R600 Localized edema: Secondary | ICD-10-CM | POA: Insufficient documentation

## 2024-01-25 DIAGNOSIS — Z0000 Encounter for general adult medical examination without abnormal findings: Secondary | ICD-10-CM | POA: Insufficient documentation

## 2024-01-25 DIAGNOSIS — Z23 Encounter for immunization: Secondary | ICD-10-CM | POA: Diagnosis not present

## 2024-01-25 DIAGNOSIS — R7303 Prediabetes: Secondary | ICD-10-CM | POA: Diagnosis not present

## 2024-01-25 DIAGNOSIS — K219 Gastro-esophageal reflux disease without esophagitis: Secondary | ICD-10-CM | POA: Diagnosis not present

## 2024-01-25 DIAGNOSIS — N3281 Overactive bladder: Secondary | ICD-10-CM

## 2024-01-25 DIAGNOSIS — E782 Mixed hyperlipidemia: Secondary | ICD-10-CM | POA: Diagnosis not present

## 2024-01-25 DIAGNOSIS — Z6841 Body Mass Index (BMI) 40.0 and over, adult: Secondary | ICD-10-CM

## 2024-01-25 LAB — CBC WITH DIFFERENTIAL/PLATELET
Abs Immature Granulocytes: 0 10*3/uL (ref 0.00–0.07)
Basophils Absolute: 0 10*3/uL (ref 0.0–0.1)
Basophils Relative: 1 %
Eosinophils Absolute: 0.1 10*3/uL (ref 0.0–0.5)
Eosinophils Relative: 2 %
HCT: 44.7 % (ref 36.0–46.0)
Hemoglobin: 14.6 g/dL (ref 12.0–15.0)
Immature Granulocytes: 0 %
Lymphocytes Relative: 26 %
Lymphs Abs: 1.8 10*3/uL (ref 0.7–4.0)
MCH: 29.6 pg (ref 26.0–34.0)
MCHC: 32.7 g/dL (ref 30.0–36.0)
MCV: 90.7 fL (ref 80.0–100.0)
Monocytes Absolute: 0.5 10*3/uL (ref 0.1–1.0)
Monocytes Relative: 8 %
Neutro Abs: 4.2 10*3/uL (ref 1.7–7.7)
Neutrophils Relative %: 63 %
Platelets: 214 10*3/uL (ref 150–400)
RBC: 4.93 MIL/uL (ref 3.87–5.11)
RDW: 13 % (ref 11.5–15.5)
WBC: 6.6 10*3/uL (ref 4.0–10.5)
nRBC: 0 % (ref 0.0–0.2)

## 2024-01-25 LAB — COMPREHENSIVE METABOLIC PANEL
ALT: 33 U/L (ref 0–44)
AST: 36 U/L (ref 15–41)
Albumin: 3.7 g/dL (ref 3.5–5.0)
Alkaline Phosphatase: 71 U/L (ref 38–126)
Anion gap: 8 (ref 5–15)
BUN: 18 mg/dL (ref 8–23)
CO2: 27 mmol/L (ref 22–32)
Calcium: 9 mg/dL (ref 8.9–10.3)
Chloride: 104 mmol/L (ref 98–111)
Creatinine, Ser: 0.94 mg/dL (ref 0.44–1.00)
GFR, Estimated: >60 mL/min (ref >60)
Glucose, Bld: 111 mg/dL — ABNORMAL HIGH (ref 70–99)
Potassium: 3.9 mmol/L (ref 3.5–5.1)
Sodium: 139 mmol/L (ref 135–145)
Total Bilirubin: 0.7 mg/dL (ref 0.0–1.2)
Total Protein: 7.6 g/dL (ref 6.5–8.1)

## 2024-01-25 LAB — LIPID PANEL
Cholesterol: 186 mg/dL (ref 0–200)
HDL: 64 mg/dL (ref >40)
LDL Cholesterol: 102 mg/dL — ABNORMAL HIGH (ref 0–99)
Total CHOL/HDL Ratio: 2.9 ratio
Triglycerides: 98 mg/dL (ref <150)
VLDL: 20 mg/dL (ref 0–40)

## 2024-01-25 LAB — TSH: TSH: 1.958 u[IU]/mL (ref 0.350–4.500)

## 2024-01-25 LAB — HEMOGLOBIN A1C
Hgb A1c MFr Bld: 5.5 % (ref 4.8–5.6)
Mean Plasma Glucose: 111.15 mg/dL

## 2024-01-25 NOTE — Assessment & Plan Note (Signed)
 Reflux symptoms are minimal on current therapy - omeprazole. No red flag signs such as weight loss, n/v, melena

## 2024-01-25 NOTE — Progress Notes (Signed)
 Date:  01/25/2024   Name:  Toni Kelly   DOB:  04-21-1961   MRN:  [id number]   Chief Complaint: Annual Exam (No concerns per patient) and Diarrhea (Last night and this morning, patient said she feels fine, thinks it's something she ate) Toni Kelly is a 63 y.o. female who presents today for her Complete Annual Exam. She feels well. She reports exercising - none . She reports she is sleeping well. Breast complaints  - none.  Health Maintenance  Topic Date Due   HIV Screening  Never done   Mammogram  06/22/2024   Colon Cancer Screening  02/10/2027   DTaP/Tdap/Td vaccine (2 - Td or Tdap) 08/11/2031   Pneumococcal Vaccination  Completed   Flu Shot  Completed   COVID-19 Vaccine  Completed   Hepatitis C Screening  Completed   Zoster (Shingles) Vaccine  Completed   HPV Vaccine  Aged Out     Gastroesophageal Reflux She complains of heartburn. She reports no abdominal pain, no chest pain, no coughing, no nausea or no wheezing. This is a recurrent problem. The problem occurs rarely. Pertinent negatives include no fatigue. She has tried a PPI for the symptoms.  Hyperlipidemia This is a chronic problem. The problem is controlled. Pertinent negatives include no chest pain, myalgias or shortness of breath. Current antihyperlipidemic treatment includes statins.    Review of Systems  Constitutional:  Negative for fatigue and unexpected weight change.  HENT:  Negative for trouble swallowing.   Eyes:  Negative for visual disturbance.  Respiratory:  Negative for cough, chest tightness, shortness of breath and wheezing.   Cardiovascular:  Negative for chest pain, palpitations and leg swelling.  Gastrointestinal:  Positive for diarrhea and heartburn. Negative for abdominal pain, constipation, nausea and vomiting.  Genitourinary:  Positive for frequency and urgency.  Musculoskeletal:  Negative for arthralgias and myalgias.  Neurological:  Negative for dizziness, weakness, light-headedness  and headaches.  Psychiatric/Behavioral:  Negative for dysphoric mood and sleep disturbance. The patient is not nervous/anxious.      Lab Results  Component Value Date   NA 146 (H) 01/23/2023   K 4.6 01/23/2023   CO2 23 01/23/2023   GLUCOSE 110 (H) 01/23/2023   BUN 14 01/23/2023   CREATININE 0.98 01/23/2023   CALCIUM 8.9 01/23/2023   EGFR 65 01/23/2023   GFRNONAA 57 (L) 01/17/2021   Lab Results  Component Value Date   CHOL 164 01/23/2023   HDL 61 01/23/2023   LDLCALC 87 01/23/2023   TRIG 88 01/23/2023   CHOLHDL 2.7 01/23/2023   Lab Results  Component Value Date   TSH 0.850 01/23/2023   Lab Results  Component Value Date   HGBA1C 5.9 (H) 01/23/2023   Lab Results  Component Value Date   WBC 5.3 01/23/2023   HGB 14.3 01/23/2023   HCT 44.5 01/23/2023   MCV 91 01/23/2023   PLT 209 01/23/2023   Lab Results  Component Value Date   ALT 43 (H) 01/23/2023   AST 51 (H) 01/23/2023   ALKPHOS 76 01/23/2023   BILITOT 0.5 01/23/2023   No results found for: "25OHVITD2", "25OHVITD3", "VD25OH"   Patient Active Problem List   Diagnosis Date Noted   Otitis externa 01/11/2024   OAB (overactive bladder) 07/09/2022   Environmental and seasonal allergies 03/07/2022   S/P total hysterectomy 01/15/2021   Vaginal dysplasia 12/03/2020   Tubular adenoma of colon 02/13/2020   Morbid obesity with BMI of 40.0-44.9, adult (HCC) 08/10/2019   Constipation  12/13/2018   Abnormal EKG 09/14/2018   DDD (degenerative disc disease), lumbar 08/26/2018   Gastroesophageal reflux disease 01/14/2018   Localized edema 10/31/2015   Mixed hyperlipidemia 09/28/2015    Allergies  Allergen Reactions   Codeine Itching   Oxycodone Itching   Ultram [Tramadol] Itching    Past Surgical History:  Procedure Laterality Date   ABDOMINAL HYSTERECTOMY  08/2018   abnormal pap   CERVICAL BIOPSY  W/ LOOP ELECTRODE EXCISION  05/2017   [facility]   COLONOSCOPY  2011   @ [facility]   COLONOSCOPY WITH PROPOFOL N/A  02/10/2020   Procedure: COLONOSCOPY WITH BIOPSY;  Surgeon: Wyline Mood, MD;  Location: [facility];  Service: Endoscopy;  Laterality: N/A;  priority 4   hemorrhoids banding  2011   POLYPECTOMY N/A 02/10/2020   Procedure: POLYPECTOMY;  Surgeon: Wyline Mood, MD;  Location[facility];  Service: Endoscopy;  Laterality: N/A;   TONSILLECTOMY     VAGINECTOMY, PARTIAL  09/2020   negative for dysplasia or malignancy    Social History   Tobacco Use   Smoking status: Never    Passive exposure: Never   Smokeless tobacco: Never  Vaping Use   Vaping status: Never Used  Substance Use Topics   Alcohol use: Yes    Alcohol/week: 0.0 standard drinks of alcohol    Comment: rarely   Drug use: No     Medication list has been reviewed and updated.  Current Meds  Medication Sig   fluticasone (FLONASE) 50 MCG/ACT nasal spray Place 2 sprays into both nostrils daily.   hydrochlorothiazide (HYDRODIURIL) 25 MG tablet Take 1 tablet by mouth daily.   levocetirizine (XYZAL) 5 MG tablet Take 1 tablet by mouth every evening.   Multiple Vitamins-Minerals (WOMENS 50+ MULTI VITAMIN/MIN PO) Take 1 tablet by mouth.   Omega-3 Fatty Acids (FISH OIL PO) Take 1 capsule by mouth daily.    omeprazole (PRILOSEC) 20 MG capsule Take 1 capsule by mouth twice daily before a meal.   oxybutynin (DITROPAN-XL) 10 MG 24 hr tablet Take 1 tablet (10 mg total) by mouth daily.   simvastatin (ZOCOR) 20 MG tablet Take 1 tablet by mouth daily.       01/25/2024    8:37 AM 11/03/2023   11:17 AM 08/03/2023    9:04 AM 01/23/2023    9:04 AM  GAD 7 : Generalized Anxiety Score  Nervous, Anxious, on Edge 0 0 0 0  Control/stop worrying 0 0 0 0  Worry too much - different things 0 0 0 0  Trouble relaxing 0 0 0 0  Restless 0 0 0 0  Easily annoyed or irritable 0 0 0 0  Afraid - awful might happen 0 0 0 0  Total GAD 7 Score 0 0 0 0  Anxiety Difficulty Not difficult at all Not difficult at all Not difficult at all Not  difficult at all       01/25/2024    8:37 AM 11/03/2023   11:17 AM 08/03/2023    9:04 AM  Depression screen PHQ 2/9  Decreased Interest 0 0 0  Down, Depressed, Hopeless 0 0 0  PHQ - 2 Score 0 0 0  Altered sleeping 0 0 0  Tired, decreased energy 0 0 0  Change in appetite 0 0 0  Feeling bad or failure about yourself  0 0 0  Trouble concentrating 0 0 0  Moving slowly or fidgety/restless 0 0 0  Suicidal thoughts 0 0 0  PHQ-9 Score  0 0 0  Difficult doing work/chores Not difficult at all Not difficult at all Not difficult at all    BP Readings from Last 3 Encounters:  01/25/24 126/82  01/11/24 126/84  11/03/23 128/74    Physical Exam Vitals and nursing note reviewed.  Constitutional:      General: She is not in acute distress.    Appearance: She is well-developed.  HENT:     Head: Normocephalic and atraumatic.     Right Ear: Tympanic membrane and ear canal normal.     Left Ear: Tympanic membrane and ear canal normal.     Nose:     Right Sinus: No maxillary sinus tenderness.     Left Sinus: No maxillary sinus tenderness.  Eyes:     General: No scleral icterus.       Right eye: No discharge.        Left eye: No discharge.     Conjunctiva/sclera: Conjunctivae normal.  Neck:     Thyroid: No thyromegaly.     Vascular: No carotid bruit.  Cardiovascular:     Rate and Rhythm: Normal rate and regular rhythm.     Pulses: Normal pulses.     Heart sounds: Normal heart sounds.  Pulmonary:     Effort: Pulmonary effort is normal. No respiratory distress.     Breath sounds: No wheezing.  Abdominal:     General: Abdomen is protuberant. Bowel sounds are normal.     Palpations: Abdomen is soft.     Tenderness: There is no abdominal tenderness. There is no guarding or rebound.  Musculoskeletal:     Cervical back: Normal range of motion. No erythema.     Right lower leg: No edema.     Left lower leg: No edema.  Lymphadenopathy:     Cervical: No cervical adenopathy.  Skin:     General: Skin is warm and dry.     Capillary Refill: Capillary refill takes less than 2 seconds.     Findings: No rash.  Neurological:     Mental Status: She is alert and oriented to person, place, and time.     Cranial Nerves: No cranial nerve deficit.     Sensory: No sensory deficit.     Gait: Gait normal.     Deep Tendon Reflexes: Reflexes are normal and symmetric.  Psychiatric:        Attention and Perception: Attention normal.        Mood and Affect: Mood normal.     Wt Readings from Last 3 Encounters:  01/25/24 259 lb 4 oz (117.6 kg)  01/11/24 262 lb (118.8 kg)  11/03/23 265 lb (120.2 kg)    BP 126/82   Pulse 76   Ht 5\' 4"  (1.626 m)   Wt 259 lb 4 oz (117.6 kg)   SpO2 94%   BMI 44.50 kg/m   Assessment and Plan:  Problem List Items Addressed This Visit       Unprioritized   Mixed hyperlipidemia   LDL is  Lab Results  Component Value Date   LDLCALC 87 01/23/2023   Current regimen is simvastatin.  Tolerating medications well without issues.       Relevant Orders   Comprehensive metabolic panel   Lipid panel   Localized edema   Symptoms and BP controlled with daily hydrochlorothiazide.      Relevant Orders   CBC with Differential/Platelet   Comprehensive metabolic panel   TSH   Gastroesophageal reflux disease   Reflux  symptoms are minimal on current therapy - omeprazole. No red flag signs such as weight loss, n/v, melena       Relevant Orders   CBC with Differential/Platelet   OAB (overactive bladder)   Doing well with Ditropan.      Other Visit Diagnoses       Annual physical exam    -  Primary   Relevant Orders   CBC with Differential/Platelet   Comprehensive metabolic panel   Hemoglobin A1c   Lipid panel   TSH     Encounter for screening mammogram for breast cancer         Prediabetes       Relevant Orders   Hemoglobin A1c     Immunization due       Relevant Orders   Pneumococcal conjugate vaccine 20-valent (Completed)     BMI  40.0-44.9, adult (HCC)   (Chronic)     Encourage more water flavored with sugar free lemonade rather than sugary soda and juice       No follow-ups on file.    Reubin Milan, MD [facility] Primary Care and Sports Medicine [place]

## 2024-01-25 NOTE — Assessment & Plan Note (Signed)
 LDL is  Lab Results  Component Value Date   LDLCALC 87 01/23/2023   Current regimen is simvastatin.  Tolerating medications well without issues.

## 2024-01-25 NOTE — Assessment & Plan Note (Signed)
 Symptoms and BP controlled with daily hydrochlorothiazide.

## 2024-01-25 NOTE — Assessment & Plan Note (Signed)
Doing well with Ditropan

## 2024-02-17 ENCOUNTER — Encounter: Payer: Self-pay | Admitting: Physician Assistant

## 2024-02-17 ENCOUNTER — Ambulatory Visit: Admitting: Internal Medicine

## 2024-02-17 ENCOUNTER — Encounter: Payer: Self-pay | Admitting: Internal Medicine

## 2024-02-17 ENCOUNTER — Ambulatory Visit (INDEPENDENT_AMBULATORY_CARE_PROVIDER_SITE_OTHER): Admitting: Physician Assistant

## 2024-02-17 VITALS — BP 126/88 | HR 81 | Temp 98.3°F | Ht 64.0 in | Wt 262.0 lb

## 2024-02-17 DIAGNOSIS — J069 Acute upper respiratory infection, unspecified: Secondary | ICD-10-CM | POA: Diagnosis not present

## 2024-02-17 NOTE — Progress Notes (Signed)
 Date:  02/17/2024   Name:  Toni Kelly   DOB:  11-May-1961   MRN:  [id number]   Chief Complaint: Cough  Cough This is a new problem. Episode onset: X2 days. The problem has been gradually worsening. Episode frequency: every 30 mins. The cough is Productive of sputum (green). Associated symptoms include headaches and rhinorrhea. Associated symptoms comments: Sneezing. Nothing aggravates the symptoms. Treatments tried: tylenol. The treatment provided no relief. Her past medical history is significant for bronchitis and environmental allergies.   Toni Kelly is a pleasant 63 year old female with a history of environmental allergies who typically sees my colleague Dr. Bari Edward, MD here for evaluation of acute URI symptoms for the last 36-48 hours including sore throat, headache, rhinorrhea, and postnasal drip with phlegm production. No fever.   She has not used her allergy medicine (fluticasone and Xyzal) since the last time she was sick.    Medication list has been reviewed and updated.  Current Meds  Medication Sig   fluticasone (FLONASE) 50 MCG/ACT nasal spray Place 2 sprays into both nostrils daily.   hydrochlorothiazide (HYDRODIURIL) 25 MG tablet Take 1 tablet by mouth daily.   levocetirizine (XYZAL) 5 MG tablet Take 1 tablet by mouth every evening.   Multiple Vitamins-Minerals (WOMENS 50+ MULTI VITAMIN/MIN PO) Take 1 tablet by mouth.   Omega-3 Fatty Acids (FISH OIL PO) Take 1 capsule by mouth daily.    omeprazole (PRILOSEC) 20 MG capsule Take 1 capsule by mouth twice daily before a meal.   oxybutynin (DITROPAN-XL) 10 MG 24 hr tablet Take 1 tablet (10 mg total) by mouth daily.   simvastatin (ZOCOR) 20 MG tablet Take 1 tablet by mouth daily.     Review of Systems  HENT:  Positive for rhinorrhea.   Respiratory:  Positive for cough.   Allergic/Immunologic: Positive for environmental allergies.  Neurological:  Positive for headaches.    Patient Active Problem List    Diagnosis Date Noted   Otitis externa 01/11/2024   OAB (overactive bladder) 07/09/2022   Environmental and seasonal allergies 03/07/2022   S/P total hysterectomy 01/15/2021   Vaginal dysplasia 12/03/2020   Tubular adenoma of colon 02/13/2020   Morbid obesity with BMI of 40.0-44.9, adult (HCC) 08/10/2019   Constipation 12/13/2018   Abnormal EKG 09/14/2018   DDD (degenerative disc disease), lumbar 08/26/2018   Gastroesophageal reflux disease 01/14/2018   Localized edema 10/31/2015   Mixed hyperlipidemia 09/28/2015    Allergies  Allergen Reactions   Codeine Itching   Oxycodone Itching   Ultram [Tramadol] Itching    Immunization History  Administered Date(s) Administered   Influenza, Seasonal, Injecte, Preservative Fre 08/08/2023   Influenza,inj,Quad PF,6+ Mos 08/08/2018, 08/10/2020   Influenza,inj,quad, With Preservative 07/26/2019   Influenza-Unspecified 09/07/2017, 07/25/2021, 09/02/2022   Moderna Covid-19 Fall Seasonal Vaccine 97yrs & older 08/30/2023   PFIZER Comirnaty(Gray Top)Covid-19 Tri-Sucrose Vaccine 05/11/2021   PFIZER(Purple Top)SARS-COV-2 Vaccination 02/03/2020, 02/28/2020, 08/25/2020, 08/10/2021, 09/02/2022   PNEUMOCOCCAL CONJUGATE-20 01/25/2024   Pneumococcal Polysaccharide-23 07/26/2019   Tdap 08/10/2021   Unspecified SARS-COV-2 Vaccination 08/08/2022   Zoster Recombinant(Shingrix) 03/21/2019, 07/29/2019    Past Surgical History:  Procedure Laterality Date   ABDOMINAL HYSTERECTOMY  08/2018   abnormal pap   CERVICAL BIOPSY  W/ LOOP ELECTRODE EXCISION  05/2017   [facility]   COLONOSCOPY  2011   @ [facility]   COLONOSCOPY WITH PROPOFOL N/A 02/10/2020   Procedure: COLONOSCOPY WITH BIOPSY;  Surgeon: Wyline Mood, MD;  Location: [facility];  Service: Endoscopy;  Laterality: N/A;  priority 4   hemorrhoids banding  2011   POLYPECTOMY N/A 02/10/2020   Procedure: POLYPECTOMY;  Surgeon: Wyline Mood, MD;  Location: [facility];  Service: Endoscopy;   Laterality: N/A;   TONSILLECTOMY     VAGINECTOMY, PARTIAL  09/2020   negative for dysplasia or malignancy    Social History   Tobacco Use   Smoking status: Never    Passive exposure: Never   Smokeless tobacco: Never  Vaping Use   Vaping status: Never Used  Substance Use Topics   Alcohol use: Yes    Alcohol/week: 0.0 standard drinks of alcohol    Comment: rarely   Drug use: No    Family History  Problem Relation Age of Onset   Heart failure Mother    Breast cancer Maternal Aunt 98   Lung cancer Maternal Aunt    Lung cancer Maternal Uncle    Lung cancer Maternal Uncle    Breast cancer Cousin 40       2 mat cousins        02/17/2024    4:01 PM 01/25/2024    8:37 AM 11/03/2023   11:17 AM 08/03/2023    9:04 AM  GAD 7 : Generalized Anxiety Score  Nervous, Anxious, on Edge 0 0 0 0  Control/stop worrying 0 0 0 0  Worry too much - different things 0 0 0 0  Trouble relaxing 0 0 0 0  Restless 0 0 0 0  Easily annoyed or irritable 0 0 0 0  Afraid - awful might happen 0 0 0 0  Total GAD 7 Score 0 0 0 0  Anxiety Difficulty Not difficult at all Not difficult at all Not difficult at all Not difficult at all       02/17/2024    4:01 PM 01/25/2024    8:37 AM 11/03/2023   11:17 AM  Depression screen PHQ 2/9  Decreased Interest 0 0 0  Down, Depressed, Hopeless 0 0 0  PHQ - 2 Score 0 0 0  Altered sleeping  0 0  Tired, decreased energy  0 0  Change in appetite  0 0  Feeling bad or failure about yourself   0 0  Trouble concentrating  0 0  Moving slowly or fidgety/restless  0 0  Suicidal thoughts  0 0  PHQ-9 Score  0 0  Difficult doing work/chores  Not difficult at all Not difficult at all    BP Readings from Last 3 Encounters:  02/17/24 126/88  01/25/24 126/82  01/11/24 126/84    Wt Readings from Last 3 Encounters:  02/17/24 262 lb (118.8 kg)  01/25/24 259 lb 4 oz (117.6 kg)  01/11/24 262 lb (118.8 kg)    BP 126/88   Pulse 81   Temp 98.3 F (36.8 C)   Ht 5\' 4"   (1.626 m)   Wt 262 lb (118.8 kg)   SpO2 99%   BMI 44.97 kg/m   Physical Exam Vitals and nursing note reviewed.  Constitutional:      General: She is not in acute distress.    Appearance: Normal appearance.  HENT:     Right Ear: Tympanic membrane is erythematous (mild).     Left Ear: Tympanic membrane is erythematous (mild).     Nose: Nose normal.     Comments: Sinuses nontender    Mouth/Throat:     Mouth: Mucous membranes are moist.     Pharynx: Posterior oropharyngeal erythema present. No oropharyngeal exudate.  Eyes:  Conjunctiva/sclera: Conjunctivae normal.     Pupils: Pupils are equal, round, and reactive to light.  Cardiovascular:     Rate and Rhythm: Normal rate and regular rhythm.     Heart sounds: No murmur heard.    No friction rub. No gallop.  Pulmonary:     Effort: Pulmonary effort is normal.     Breath sounds: Normal breath sounds. No wheezing, rhonchi or rales.  Abdominal:     General: There is no distension.  Musculoskeletal:        General: Normal range of motion.  Lymphadenopathy:     Cervical: Cervical adenopathy present.     Left cervical: Posterior cervical adenopathy present.  Skin:    General: Skin is warm and dry.  Neurological:     Mental Status: She is alert and oriented to person, place, and time.     Gait: Gait is intact.  Psychiatric:        Mood and Affect: Mood and affect normal.     Recent Labs     Component Value Date/Time   NA 139 01/25/2024 0940   NA 146 (H) 01/23/2023 0948   K 3.9 01/25/2024 0940   CL 104 01/25/2024 0940   CO2 27 01/25/2024 0940   GLUCOSE 111 (H) 01/25/2024 0940   BUN 18 01/25/2024 0940   BUN 14 01/23/2023 0948   CREATININE 0.94 01/25/2024 0940   CALCIUM 9.0 01/25/2024 0940   PROT 7.6 01/25/2024 0940   PROT 6.6 01/23/2023 0948   ALBUMIN 3.7 01/25/2024 0940   ALBUMIN 4.0 01/23/2023 0948   AST 36 01/25/2024 0940   ALT 33 01/25/2024 0940   ALKPHOS 71 01/25/2024 0940   BILITOT 0.7 01/25/2024 0940    BILITOT 0.5 01/23/2023 0948   GFRNONAA >60 01/25/2024 0940   GFRAA 66 01/17/2021 0936    Lab Results  Component Value Date   WBC 6.6 01/25/2024   HGB 14.6 01/25/2024   HCT 44.7 01/25/2024   MCV 90.7 01/25/2024   PLT 214 01/25/2024   Lab Results  Component Value Date   HGBA1C 5.5 01/25/2024   Lab Results  Component Value Date   CHOL 186 01/25/2024   HDL 64 01/25/2024   LDLCALC 102 (H) 01/25/2024   TRIG 98 01/25/2024   CHOLHDL 2.9 01/25/2024   Lab Results  Component Value Date   TSH 1.958 01/25/2024     Assessment and Plan:  1. Acute URI (Primary) URI vs allergic rhinitis, or both. Patient reassured. Restart fluticasone, Xyzal. Add OTC Mucinex-DM. Reach out Friday if not improving at all.     Return if symptoms worsen or fail to improve.    Alvester Morin, PA-C, DMSc, Nutritionist [facility] [phone]

## 2024-02-19 ENCOUNTER — Telehealth: Payer: Self-pay

## 2024-02-19 ENCOUNTER — Other Ambulatory Visit: Payer: Self-pay | Admitting: Physician Assistant

## 2024-02-19 MED ORDER — AZITHROMYCIN 250 MG PO TABS: ORAL_TABLET | ORAL | 0 refills | Status: AC

## 2024-02-19 NOTE — Telephone Encounter (Signed)
 Done, patient informed.

## 2024-02-19 NOTE — Telephone Encounter (Signed)
 Please review

## 2024-02-19 NOTE — Telephone Encounter (Signed)
 Already done, see other message

## 2024-02-19 NOTE — Telephone Encounter (Signed)
 Copied from CRM [id number]. Topic: Clinical - Medical Advice >> Feb 19, 2024  8:39 AM Toni Kelly wrote: Reason for CRM: Patient stated that DR Mordecai Maes wanted her to call if she was not feeling better by Friday to get an antibiotic called in//Patient is not feeling better//Please call in prescription to  [id number], [place] - [address] [place] [address] Phone: [phone] Fax: [phone] Hours: Not open 24 hours

## 2024-02-24 ENCOUNTER — Other Ambulatory Visit: Payer: Self-pay | Admitting: Internal Medicine

## 2024-02-24 DIAGNOSIS — R600 Localized edema: Secondary | ICD-10-CM

## 2024-02-25 NOTE — Telephone Encounter (Signed)
 Requested Prescriptions  Pending Prescriptions Disp Refills   hydrochlorothiazide (HYDRODIURIL) 25 MG tablet [Pharmacy Med Name: Hydrochlorothiazide 25mg  Tablet] 90 tablet 1    Sig: Take 1 tablet by mouth daily.     Cardiovascular: Diuretics - Thiazide Passed - 02/25/2024  5:09 PM      Passed - Cr in normal range and within 180 days    Creatinine, Ser  Date Value Ref Range Status  01/25/2024 0.94 0.44 - 1.00 mg/dL Final         Passed - K in normal range and within 180 days    Potassium  Date Value Ref Range Status  01/25/2024 3.9 3.5 - 5.1 mmol/L Final         Passed - Na in normal range and within 180 days    Sodium  Date Value Ref Range Status  01/25/2024 139 135 - 145 mmol/L Final  01/23/2023 146 (H) 134 - 144 mmol/L Final         Passed - Last BP in normal range    BP Readings from Last 1 Encounters:  02/17/24 126/88         Passed - Valid encounter within last 6 months    Recent Outpatient Visits           1 week ago Acute URI   [place] [facility] Mordecai Maes, Melton Alar, PA   1 month ago Annual physical exam   [facility] at [facility], Nyoka Cowden, MD   1 month ago Other infective acute otitis externa of right ear   [place] [facility], Ocie Bob, MD       Future Appointments             In 4 days McGowan, Elana Alm [facility] [place]

## 2024-02-26 NOTE — Progress Notes (Signed)
 02/29/2024 9:50 AM   Toni Kelly [birth year] [id number]  Referring provider: Reubin Milan, MD [address] [place],  [place] [address]  Urological history: 1. OAB -contributing factors of age, GSM, Obesity, HTN, lumbar DDD, Edema, hysterectomy, alcohol consumption and artificial sweeteners - Oxybutynin XL 10 mg daily  2. Urge incontinence -contributing factors of age, GSM, Arthritis, Obesity, HTN, lumbar DDD, edema, hysterectomy, alcohol consumption and artificial sweeteners - Oxybutynin XL 10 mg daily  3. Nocturia -contributing factors of age, ? sleep apnea , HTN, edema and arthritis - Oxybutynin XL 10 mg daily  Chief Complaint  Patient presents with   Over Active Bladder   HPI: Toni Kelly is a 63 y.o. female who presents today for 6 month follow up.  Previous records reviewed.   They are having 1 to 7 daytime voids,  they are having nocturia 1-2 and urgency is strong.   They are having urge incontinence.   They are leaking 1-2 weekly.  They are using nothing for leakage.  They are not limiting fluids.  They engaging in toilet mapping.   She is not experiencing any untoward side effects of the oxybutynin.  Patient denies any modifying or aggravating factors.  Patient denies any recent UTI's, gross hematuria, dysuria or suprapubic/flank pain.  Patient denies any fevers, chills, nausea or vomiting.    She states that her nocturia has gone down from every 2 hours to once or twice at night.  PVR 0 mL   PMH: Past Medical History:  Diagnosis Date   Anal fistula 07/21/2013   Arthritis    lower back   Bursitis    Elbow tendonitis 10/31/2015   Esophageal reflux    Hemorrhoids 2011   HPV (human papilloma virus) infection    HSIL on Pap smear of cervix 05/23/2016   Hyperlipidemia    Hypertension     Surgical History: Past Surgical History:  Procedure Laterality Date   ABDOMINAL HYSTERECTOMY  08/2018   abnormal pap   CERVICAL BIOPSY  W/ LOOP  ELECTRODE EXCISION  05/2017   [facility]   COLONOSCOPY  2011   @ [facility]   COLONOSCOPY WITH PROPOFOL N/A 02/10/2020   Procedure: COLONOSCOPY WITH BIOPSY;  Surgeon: Wyline Mood, MD;  Location: [facility];  Service: Endoscopy;  Laterality: N/A;  priority 4   hemorrhoids banding  2011   POLYPECTOMY N/A 02/10/2020   Procedure: POLYPECTOMY;  Surgeon: Wyline Mood, MD;  Location: [facility];  Service: Endoscopy;  Laterality: N/A;   TONSILLECTOMY     VAGINECTOMY, PARTIAL  09/2020   negative for dysplasia or malignancy    Home Medications:  Allergies as of 02/29/2024       Reactions   Codeine Itching   Oxycodone Itching   Ultram [tramadol] Itching        Medication List        Accurate as of February 29, 2024  9:50 AM. If you have any questions, ask your nurse or doctor.          FISH OIL PO Take 1 capsule by mouth daily.   fluticasone 50 MCG/ACT nasal spray Commonly known as: FLONASE Place 2 sprays into both nostrils daily.   hydrochlorothiazide 25 MG tablet Commonly known as: HYDRODIURIL Take 1 tablet by mouth daily.   levocetirizine 5 MG tablet Commonly known as: XYZAL Take 1 tablet by mouth every evening.   omeprazole 20 MG capsule Commonly known as: PRILOSEC Take 1 capsule by mouth twice  daily before a meal.   oxybutynin 10 MG 24 hr tablet Commonly known as: DITROPAN-XL Take 1 tablet (10 mg total) by mouth daily.   simvastatin 20 MG tablet Commonly known as: ZOCOR Take 1 tablet by mouth daily.   WOMENS 50+ MULTI VITAMIN/MIN PO Take 1 tablet by mouth.        Allergies:  Allergies  Allergen Reactions   Codeine Itching   Oxycodone Itching   Ultram [Tramadol] Itching    Family History: Family History  Problem Relation Age of Onset   Heart failure Mother    Breast cancer Maternal Aunt 72   Lung cancer Maternal Aunt    Lung cancer Maternal Uncle    Lung cancer Maternal Uncle    Breast cancer Cousin 40       2 mat cousins    Social  History:  reports that she has never smoked. She has never been exposed to tobacco smoke. She has never used smokeless tobacco. She reports current alcohol use. She reports that she does not use drugs.  ROS: Pertinent ROS in HPI  Physical Exam: BP (!) 169/84 (BP Location: Left Arm, Patient Position: Sitting, Cuff Size: Large)   Pulse 65   Ht 5\' 4"  (1.626 m)   Wt 256 lb (116.1 kg)   SpO2 95%   BMI 43.94 kg/m   Constitutional:  Well nourished. Alert and oriented, No acute distress. HEENT:  AT, moist mucus membranes.  Trachea midline Cardiovascular: No clubbing, cyanosis, or edema. Respiratory: Normal respiratory effort, no increased work of breathing. Neurologic: Grossly intact, no focal deficits, moving all 4 extremities. Psychiatric: Normal mood and affect.   Laboratory Data: Lab Results  Component Value Date   WBC 6.6 01/25/2024   HGB 14.6 01/25/2024   HCT 44.7 01/25/2024   MCV 90.7 01/25/2024   PLT 214 01/25/2024   Lab Results  Component Value Date   CREATININE 0.94 01/25/2024   Lab Results  Component Value Date   HGBA1C 5.5 01/25/2024   Lab Results  Component Value Date   TSH 1.958 01/25/2024      Component Value Date/Time   CHOL 186 01/25/2024 0940   CHOL 164 01/23/2023 0948   HDL 64 01/25/2024 0940   HDL 61 01/23/2023 0948   CHOLHDL 2.9 01/25/2024 0940   VLDL 20 01/25/2024 0940   LDLCALC 102 (H) 01/25/2024 0940   LDLCALC 87 01/23/2023 0948   Lab Results  Component Value Date   AST 36 01/25/2024   Lab Results  Component Value Date   ALT 33 01/25/2024  I have reviewed the labs.   Pertinent Imaging:  02/29/24 09:28  Scan Result 0mL    Assessment & Plan:    1. OAB -At goal with the Ditropan XL 10 mg daily -Continue Ditropan XL 10 mg daily  2. Urge incontinence -Subsided with Ditropan XL 10 mg daily -Continue to Ditropan  XL 10 mg daily  3. Nocturia -Decreased from every 2 hours at night, to 1-2 times at night -Continue Ditropan XL 10  mg daily  Return in about 1 year (around 02/28/2025) for PVR and OAB questionnaire.  These notes generated with voice recognition software. I apologize for typographical errors.  Cloretta Ned  [facility] [address]  [address] [place], [place] [address] [phone]

## 2024-02-29 ENCOUNTER — Encounter: Payer: Self-pay | Admitting: Urology

## 2024-02-29 ENCOUNTER — Ambulatory Visit (INDEPENDENT_AMBULATORY_CARE_PROVIDER_SITE_OTHER): Payer: Self-pay | Admitting: Urology

## 2024-02-29 VITALS — BP 169/84 | HR 65 | Ht 64.0 in | Wt 256.0 lb

## 2024-02-29 DIAGNOSIS — N3941 Urge incontinence: Secondary | ICD-10-CM | POA: Diagnosis not present

## 2024-02-29 DIAGNOSIS — R351 Nocturia: Secondary | ICD-10-CM

## 2024-02-29 DIAGNOSIS — N3281 Overactive bladder: Secondary | ICD-10-CM

## 2024-02-29 LAB — BLADDER SCAN AMB NON-IMAGING

## 2024-02-29 MED ORDER — OXYBUTYNIN CHLORIDE ER 10 MG PO TB24: 10.0000 mg | ORAL_TABLET | Freq: Every day | ORAL | 3 refills | Status: DC

## 2024-02-29 NOTE — Patient Instructions (Signed)

## 2024-03-22 DIAGNOSIS — Z1331 Encounter for screening for depression: Secondary | ICD-10-CM | POA: Diagnosis not present

## 2024-03-22 DIAGNOSIS — Z01419 Encounter for gynecological examination (general) (routine) without abnormal findings: Secondary | ICD-10-CM | POA: Diagnosis not present

## 2024-03-22 DIAGNOSIS — N951 Menopausal and female climacteric states: Secondary | ICD-10-CM | POA: Diagnosis not present

## 2024-03-22 DIAGNOSIS — Z87410 Personal history of cervical dysplasia: Secondary | ICD-10-CM | POA: Diagnosis not present

## 2024-03-25 ENCOUNTER — Telehealth: Payer: Self-pay | Admitting: Pharmacy Technician

## 2024-03-25 ENCOUNTER — Other Ambulatory Visit (HOSPITAL_COMMUNITY): Payer: Self-pay

## 2024-03-25 NOTE — Telephone Encounter (Signed)
 Left pt message on her voice mail

## 2024-03-25 NOTE — Telephone Encounter (Signed)
 Pharmacy Patient Advocate Encounter   Received notification from Onbase that prior authorization for Levocetirizine Dihydrochloride  5MG  tablets is required/requested.   Insurance verification completed.   The patient is insured through [facility] .   Per test claim: Levocetirizine Dihydrochloride  5MG  tablets is not covered, and is a plan/benefit exclusion  Levocetirizine Dihydrochloride  5MG  tablets can be purchased from any one of our [facility] as on OTC for around $15 or with a prescription off of insurance for $10 for a month supply

## 2024-04-09 ENCOUNTER — Encounter: Payer: Self-pay | Admitting: Internal Medicine

## 2024-04-13 DIAGNOSIS — S8001XA Contusion of right knee, initial encounter: Secondary | ICD-10-CM | POA: Diagnosis not present

## 2024-04-20 ENCOUNTER — Encounter: Payer: Self-pay | Admitting: Internal Medicine

## 2024-04-20 NOTE — Telephone Encounter (Signed)
 FYI  KP

## 2024-05-14 ENCOUNTER — Other Ambulatory Visit: Payer: Self-pay | Admitting: Internal Medicine

## 2024-05-14 DIAGNOSIS — E782 Mixed hyperlipidemia: Secondary | ICD-10-CM

## 2024-05-17 NOTE — Telephone Encounter (Signed)
 Requested Prescriptions  Pending Prescriptions Disp Refills   simvastatin  (ZOCOR ) 20 MG tablet [Pharmacy Med Name: Simvastatin  20mg  Tablet] 90 tablet 1    Sig: Take 1 tablet by mouth daily.     Cardiovascular:  Antilipid - Statins Failed - 05/17/2024 12:25 PM      Failed - Lipid Panel in normal range within the last 12 months    Cholesterol, Total  Date Value Ref Range Status  01/23/2023 164 100 - 199 mg/dL Final   Cholesterol  Date Value Ref Range Status  01/25/2024 186 0 - 200 mg/dL Final   LDL Chol Calc (NIH)  Date Value Ref Range Status  01/23/2023 87 0 - 99 mg/dL Final   LDL Cholesterol  Date Value Ref Range Status  01/25/2024 102 (H) 0 - 99 mg/dL Final    Comment:           Total Cholesterol/HDL:CHD Risk Coronary Heart Disease Risk Table                     Men   Women  1/2 Average Risk   3.4   3.3  Average Risk       5.0   4.4  2 X Average Risk   9.6   7.1  3 X Average Risk  23.4   11.0        Use the calculated Patient Ratio above and the CHD Risk Table to determine the patient's CHD Risk.        ATP III CLASSIFICATION (LDL):  <100     mg/dL   Optimal  899-870  mg/dL   Near or Above                    Optimal  130-159  mg/dL   Borderline  839-810  mg/dL   High  >809     mg/dL   Very High Performed at [facility] [address] [place], [place] [address]    HDL  Date Value Ref Range Status  01/25/2024 64 >40 mg/dL Final  96/98/7975 61 >60 mg/dL Final   Triglycerides  Date Value Ref Range Status  01/25/2024 98 <150 mg/dL Final         Passed - Patient is not pregnant      Passed - Valid encounter within last 12 months    Recent Outpatient Visits           3 months ago Acute URI   [place] [facility] [place], Toribio SQUIBB, [place]   3 months ago Annual physical exam   [facility] at [facility], Leita DEL, MD   4 months ago Other infective acute otitis  externa of right ear   [place] [facility], Selinda PARAS, MD       Future Appointments             In 9 months McGowan, [name] [facility] [place]

## 2024-05-26 ENCOUNTER — Other Ambulatory Visit: Payer: Self-pay | Admitting: Internal Medicine

## 2024-05-26 DIAGNOSIS — J3089 Other allergic rhinitis: Secondary | ICD-10-CM

## 2024-05-30 NOTE — Telephone Encounter (Signed)
 Requested Prescriptions  Pending Prescriptions Disp Refills   levocetirizine (XYZAL ) 5 MG tablet [Pharmacy Med Name: Levocetirizine 5mg  Tablet] 90 tablet 1    Sig: Take 1 tablet by mouth every evening.     Ear, Nose, and Throat:  Antihistamines - levocetirizine dihydrochloride  Passed - 05/30/2024  4:42 PM      Passed - Cr in normal range and within 360 days    Creatinine, Ser  Date Value Ref Range Status  01/25/2024 0.94 0.44 - 1.00 mg/dL Final         Passed - eGFR is 10 or above and within 360 days    GFR calc Af Amer  Date Value Ref Range Status  01/17/2021 66 >59 mL/min/1.73 Final    Comment:    **In accordance with recommendations from the NKF-ASN Task force,**   [facility] is in the process of updating its eGFR calculation to the   2021 CKD-EPI creatinine equation that estimates kidney function   without a race variable.    GFR, Estimated  Date Value Ref Range Status  01/25/2024 >60 >60 mL/min Final    Comment:    (NOTE) Calculated using the CKD-EPI Creatinine Equation (2021)    eGFR  Date Value Ref Range Status  01/23/2023 65 >59 mL/min/1.73 Final         Passed - Valid encounter within last 12 months    Recent Outpatient Visits           3 months ago Acute URI   [place] [facility] [place], Toribio SQUIBB, [place]   4 months ago Annual physical exam   [facility] at [facility], Leita DEL, MD   4 months ago Other infective acute otitis externa of right ear   [place] [facility], Selinda PARAS, MD       Future Appointments             In 9 months McGowan, [name] [facility] [place]

## 2024-06-20 ENCOUNTER — Other Ambulatory Visit: Payer: Self-pay | Admitting: Internal Medicine

## 2024-06-20 DIAGNOSIS — R600 Localized edema: Secondary | ICD-10-CM

## 2024-06-21 ENCOUNTER — Encounter: Payer: Self-pay | Admitting: Internal Medicine

## 2024-06-21 NOTE — Telephone Encounter (Signed)
 Requested Prescriptions  Refused Prescriptions Disp Refills   hydrochlorothiazide  (HYDRODIURIL ) 25 MG tablet [Pharmacy Med Name: Hydrochlorothiazide  25mg  Tablet] 90 tablet 0    Sig: Take 1 tablet by mouth daily.     Cardiovascular: Diuretics - Thiazide Failed - 06/21/2024  1:58 PM      Failed - Last BP in normal range    BP Readings from Last 1 Encounters:  02/29/24 (!) 169/84         Passed - Cr in normal range and within 180 days    Creatinine, Ser  Date Value Ref Range Status  01/25/2024 0.94 0.44 - 1.00 mg/dL Final         Passed - K in normal range and within 180 days    Potassium  Date Value Ref Range Status  01/25/2024 3.9 3.5 - 5.1 mmol/L Final         Passed - Na in normal range and within 180 days    Sodium  Date Value Ref Range Status  01/25/2024 139 135 - 145 mmol/L Final  01/23/2023 146 (H) 134 - 144 mmol/L Final         Passed - Valid encounter within last 6 months    Recent Outpatient Visits           4 months ago Acute URI   [place] [facility] [place], Toribio SQUIBB, [place]   4 months ago Annual physical exam   [facility] at [facility], Leita DEL, MD   5 months ago Other infective acute otitis externa of right ear   [place] [facility], Selinda PARAS, MD       Future Appointments             In 8 months McGowan, [name] [facility] [place]

## 2024-06-23 ENCOUNTER — Ambulatory Visit: Payer: BC Managed Care – PPO

## 2024-07-05 ENCOUNTER — Other Ambulatory Visit: Payer: Self-pay | Admitting: Medical Genetics

## 2024-07-13 ENCOUNTER — Encounter: Payer: Self-pay | Admitting: Internal Medicine

## 2024-07-27 ENCOUNTER — Ambulatory Visit
Admission: RE | Admit: 2024-07-27 | Discharge: 2024-07-27 | Disposition: A | Discharge status: Home / Self Care | Source: Ambulatory Visit | Attending: Obstetrics and Gynecology | Admitting: Obstetrics and Gynecology

## 2024-07-27 DIAGNOSIS — Z1231 Encounter for screening mammogram for malignant neoplasm of breast: Secondary | ICD-10-CM | POA: Diagnosis present

## 2024-08-03 ENCOUNTER — Encounter: Payer: Self-pay | Admitting: Internal Medicine

## 2024-08-03 ENCOUNTER — Other Ambulatory Visit: Payer: Self-pay

## 2024-08-03 MED ORDER — COVID-19 MRNA VAC-TRIS(PFIZER) 30 MCG/0.3ML IM SUSY: 0.3000 mL | PREFILLED_SYRINGE | Freq: Once | INTRAMUSCULAR | 0 refills | Status: AC

## 2024-08-14 ENCOUNTER — Other Ambulatory Visit: Payer: Self-pay | Admitting: Internal Medicine

## 2024-08-14 DIAGNOSIS — R600 Localized edema: Secondary | ICD-10-CM

## 2024-08-15 NOTE — Telephone Encounter (Signed)
 Requested medications are due for refill today.  yes  Requested medications are on the active medications list.  yes  Last refill. 02/25/2024 #90 1 rf  Future visit scheduled.   yes  Notes to clinic.  Labs are expired. Pt last seen for CPE 01/25/2024.    Requested Prescriptions  Pending Prescriptions Disp Refills   hydrochlorothiazide  (HYDRODIURIL ) 25 MG tablet [Pharmacy Med Name: Hydrochlorothiazide  25mg  Tablet] 90 tablet 0    Sig: Take 1 tablet by mouth daily.     Cardiovascular: Diuretics - Thiazide Failed - 08/15/2024  5:53 PM      Failed - Cr in normal range and within 180 days    Creatinine, Ser  Date Value Ref Range Status  01/25/2024 0.94 0.44 - 1.00 mg/dL Final         Failed - K in normal range and within 180 days    Potassium  Date Value Ref Range Status  01/25/2024 3.9 3.5 - 5.1 mmol/L Final         Failed - Na in normal range and within 180 days    Sodium  Date Value Ref Range Status  01/25/2024 139 135 - 145 mmol/L Final  01/23/2023 146 (H) 134 - 144 mmol/L Final         Failed - Last BP in normal range    BP Readings from Last 1 Encounters:  02/29/24 (!) 169/84         Failed - Valid encounter within last 6 months    Recent Outpatient Visits           6 months ago Acute URI   [place] [facility], Toribio SQUIBB, [place]   6 months ago Annual physical exam   [facility] at [facility], Leita DEL, MD   7 months ago Other infective acute otitis externa of right ear   [place] [facility], Selinda PARAS, MD       Future Appointments             In 6 months McGowan, [name] [facility] [place]

## 2024-09-16 ENCOUNTER — Other Ambulatory Visit: Payer: Self-pay | Admitting: Medical Genetics

## 2024-09-16 DIAGNOSIS — Z006 Encounter for examination for normal comparison and control in clinical research program: Secondary | ICD-10-CM

## 2024-09-24 ENCOUNTER — Other Ambulatory Visit: Payer: Self-pay | Admitting: Internal Medicine

## 2024-09-24 DIAGNOSIS — J3089 Other allergic rhinitis: Secondary | ICD-10-CM

## 2024-09-27 ENCOUNTER — Encounter: Payer: Self-pay | Admitting: Internal Medicine

## 2024-09-27 ENCOUNTER — Other Ambulatory Visit: Payer: Self-pay

## 2024-09-27 DIAGNOSIS — J3089 Other allergic rhinitis: Secondary | ICD-10-CM

## 2024-09-27 DIAGNOSIS — J0140 Acute pansinusitis, unspecified: Secondary | ICD-10-CM

## 2024-09-27 MED ORDER — LEVOCETIRIZINE DIHYDROCHLORIDE 5 MG PO TABS
5.0000 mg | ORAL_TABLET | Freq: Every evening | ORAL | 1 refills | Status: DC
Start: 2024-09-27 — End: 2024-10-12

## 2024-09-27 MED ORDER — FLUTICASONE PROPIONATE 50 MCG/ACT NA SUSP
2.0000 | Freq: Every day | NASAL | 1 refills | Status: AC
Start: 2024-09-27 — End: ?

## 2024-09-27 NOTE — Telephone Encounter (Signed)
 Too soon for refill, LRF 05/30/24 FOR 90 AND 1 RF.  Requested Prescriptions  Pending Prescriptions Disp Refills   levocetirizine (XYZAL ) 5 MG tablet [Pharmacy Med Name: Levocetirizine 5mg  Tablet] 90 tablet 0    Sig: Take 1 tablet by mouth every evening.     Ear, Nose, and Throat:  Antihistamines - levocetirizine dihydrochloride  Passed - 09/27/2024 10:59 AM      Passed - Cr in normal range and within 360 days    Creatinine, Ser  Date Value Ref Range Status  01/25/2024 0.94 0.44 - 1.00 mg/dL Final         Passed - eGFR is 10 or above and within 360 days    GFR calc Af Amer  Date Value Ref Range Status  01/17/2021 66 >59 mL/min/1.73 Final    Comment:    **In accordance with recommendations from the NKF-ASN Task force,**   [facility] is in the process of updating its eGFR calculation to the   2021 CKD-EPI creatinine equation that estimates kidney function   without a race variable.    GFR, Estimated  Date Value Ref Range Status  01/25/2024 >60 >60 mL/min Final    Comment:    (NOTE) Calculated using the CKD-EPI Creatinine Equation (2021)    eGFR  Date Value Ref Range Status  01/23/2023 65 >59 mL/min/1.73 Final         Passed - Valid encounter within last 12 months    Recent Outpatient Visits           7 months ago Acute URI   [place] [facility] at [facility], Toribio SQUIBB, [place]   8 months ago Annual physical exam   [facility] at [facility], Leita DEL, MD   8 months ago Other infective acute otitis externa of right ear   [place] [facility] at [facility], Selinda PARAS, MD       Future Appointments             In 5 months McGowan, [name] [facility] [place]

## 2024-10-12 ENCOUNTER — Encounter: Payer: Self-pay | Admitting: Internal Medicine

## 2024-10-12 ENCOUNTER — Ambulatory Visit (INDEPENDENT_AMBULATORY_CARE_PROVIDER_SITE_OTHER): Admitting: Internal Medicine

## 2024-10-12 VITALS — BP 118/74 | HR 83 | Ht 64.0 in | Wt 262.0 lb

## 2024-10-12 DIAGNOSIS — E782 Mixed hyperlipidemia: Secondary | ICD-10-CM

## 2024-10-12 DIAGNOSIS — J3089 Other allergic rhinitis: Secondary | ICD-10-CM | POA: Diagnosis not present

## 2024-10-12 DIAGNOSIS — R600 Localized edema: Secondary | ICD-10-CM | POA: Diagnosis not present

## 2024-10-12 DIAGNOSIS — K219 Gastro-esophageal reflux disease without esophagitis: Secondary | ICD-10-CM

## 2024-10-12 MED ORDER — SIMVASTATIN 20 MG PO TABS: 20.0000 mg | ORAL_TABLET | Freq: Every day | ORAL | 1 refills | Status: DC

## 2024-10-12 MED ORDER — LEVOCETIRIZINE DIHYDROCHLORIDE 5 MG PO TABS: 5.0000 mg | ORAL_TABLET | Freq: Every evening | ORAL | 1 refills | Status: DC

## 2024-10-12 MED ORDER — OMEPRAZOLE 20 MG PO CPDR: DELAYED_RELEASE_CAPSULE | ORAL | 1 refills | Status: DC

## 2024-10-12 MED ORDER — HYDROCHLOROTHIAZIDE 25 MG PO TABS: 25.0000 mg | ORAL_TABLET | Freq: Every day | ORAL | 1 refills | Status: DC

## 2024-10-12 NOTE — Progress Notes (Signed)
 Date:  10/12/2024   Name:  Toni Kelly   DOB:  10-06-1961   MRN:  [id number]   Chief Complaint: Hypertension, Gastroesophageal Reflux, and Hyperlipidemia  Gastroesophageal Reflux She complains of heartburn. She reports no abdominal pain, no chest pain, no coughing or no wheezing. This is a recurrent problem. The problem occurs rarely. Pertinent negatives include no fatigue. She has tried a PPI for the symptoms. The treatment provided significant relief.  Hyperlipidemia This is a chronic problem. The problem is controlled. Pertinent negatives include no chest pain, myalgias or shortness of breath. Current antihyperlipidemic treatment includes statins.  LE edema - chronic mild and controlled with hydrochlorothiazide .  No prior dx of HTN and readings have all been in normal range.  Continue elevation and limited sodium diet.   Review of Systems  Constitutional:  Negative for fatigue and unexpected weight change.  HENT:  Negative for trouble swallowing.   Eyes:  Negative for visual disturbance.  Respiratory:  Negative for cough, chest tightness, shortness of breath and wheezing.   Cardiovascular:  Negative for chest pain, palpitations and leg swelling.  Gastrointestinal:  Positive for heartburn. Negative for abdominal pain, constipation and diarrhea.  Musculoskeletal:  Negative for arthralgias and myalgias.  Neurological:  Negative for dizziness, weakness, light-headedness and headaches.     Lab Results  Component Value Date   NA 139 01/25/2024   K 3.9 01/25/2024   CO2 27 01/25/2024   GLUCOSE 111 (H) 01/25/2024   BUN 18 01/25/2024   CREATININE 0.94 01/25/2024   CALCIUM 9.0 01/25/2024   EGFR 65 01/23/2023   GFRNONAA >60 01/25/2024   Lab Results  Component Value Date   CHOL 186 01/25/2024   HDL 64 01/25/2024   LDLCALC 102 (H) 01/25/2024   TRIG 98 01/25/2024   CHOLHDL 2.9 01/25/2024   Lab Results  Component Value Date   TSH 1.958 01/25/2024   Lab Results  Component  Value Date   HGBA1C 5.5 01/25/2024   Lab Results  Component Value Date   WBC 6.6 01/25/2024   HGB 14.6 01/25/2024   HCT 44.7 01/25/2024   MCV 90.7 01/25/2024   PLT 214 01/25/2024   Lab Results  Component Value Date   ALT 33 01/25/2024   AST 36 01/25/2024   ALKPHOS 71 01/25/2024   BILITOT 0.7 01/25/2024   No results found for: MARIEN BOLLS, VD25OH   Patient Active Problem List   Diagnosis Date Noted   OAB (overactive bladder) 07/09/2022   Environmental and seasonal allergies 03/07/2022   S/P total hysterectomy 01/15/2021   Vaginal dysplasia 12/03/2020   Tubular adenoma of colon 02/13/2020   Morbid obesity with BMI of 40.0-44.9, adult (HCC) 08/10/2019   Constipation 12/13/2018   Abnormal EKG 09/14/2018   DDD (degenerative disc disease), lumbar 08/26/2018   Gastroesophageal reflux disease 01/14/2018   Localized edema 10/31/2015   Mixed hyperlipidemia 09/28/2015    Allergies  Allergen Reactions   Codeine Itching   Oxycodone Itching   Ultram [Tramadol] Itching    Past Surgical History:  Procedure Laterality Date   ABDOMINAL HYSTERECTOMY  08/2018   abnormal pap   CERVICAL BIOPSY  W/ LOOP ELECTRODE EXCISION  05/2017   [facility]   CESAREAN SECTION  1996   COLONOSCOPY  2011   @ [facility]   COLONOSCOPY WITH PROPOFOL  N/A 02/10/2020   Procedure: COLONOSCOPY WITH BIOPSY;  Surgeon: Therisa Bi, MD;  [facility];  Service: Endoscopy;  Laterality: N/A;  priority 4   hemorrhoids banding  2011   POLYPECTOMY N/A 02/10/2020   Procedure: POLYPECTOMY;  Surgeon: Therisa Bi, MD;  Location: [facility];  Service: Endoscopy;  Laterality: N/A;   TONSILLECTOMY     TUBAL LIGATION  1996   VAGINECTOMY, PARTIAL  09/2020   negative for dysplasia or malignancy    Social History   Tobacco Use   Smoking status: Never    Passive exposure: Never   Smokeless tobacco: Never  Vaping Use   Vaping status: Never Used  Substance Use Topics   Alcohol use:  Yes    Alcohol/week: 0.0 standard drinks of alcohol    Comment: rarely   Drug use: No     Medication list has been reviewed and updated.  Current Meds  Medication Sig   fluticasone  (FLONASE ) 50 MCG/ACT nasal spray Place 2 sprays into both nostrils daily.   Multiple Vitamins-Minerals (WOMENS 50+ MULTI VITAMIN/MIN PO) Take 1 tablet by mouth.   Omega-3 Fatty Acids (FISH OIL PO) Take 1 capsule by mouth daily.    oxybutynin  (DITROPAN -XL) 10 MG 24 hr tablet Take 1 tablet (10 mg total) by mouth daily.   [DISCONTINUED] hydrochlorothiazide  (HYDRODIURIL ) 25 MG tablet Take 1 tablet by mouth daily.   [DISCONTINUED] levocetirizine (XYZAL ) 5 MG tablet Take 1 tablet (5 mg total) by mouth every evening.   [DISCONTINUED] omeprazole  (PRILOSEC) 20 MG capsule Take 1 capsule by mouth twice daily before a meal.   [DISCONTINUED] simvastatin  (ZOCOR ) 20 MG tablet Take 1 tablet by mouth daily.       10/12/2024    2:19 PM 02/17/2024    4:01 PM 01/25/2024    8:37 AM 11/03/2023   11:17 AM  GAD 7 : Generalized Anxiety Score  Nervous, Anxious, on Edge 0 0 0 0  Control/stop worrying 0 0 0 0  Worry too much - different things 0 0 0 0  Trouble relaxing 0 0 0 0  Restless 0 0 0 0  Easily annoyed or irritable 0 0 0 0  Afraid - awful might happen 0 0 0 0  Total GAD 7 Score 0 0 0 0  Anxiety Difficulty Not difficult at all Not difficult at all Not difficult at all Not difficult at all       10/12/2024    2:19 PM 02/17/2024    4:01 PM 01/25/2024    8:37 AM  Depression screen PHQ 2/9  Decreased Interest 0 0 0  Down, Depressed, Hopeless 0 0 0  PHQ - 2 Score 0 0 0  Altered sleeping 0  0  Tired, decreased energy 0  0  Change in appetite 0  0  Feeling bad or failure about yourself  0  0  Trouble concentrating 0  0  Moving slowly or fidgety/restless 0  0  Suicidal thoughts 0  0  PHQ-9 Score 0  0   Difficult doing work/chores Not difficult at all  Not difficult at all     Data saved with a previous flowsheet row  definition    BP Readings from Last 3 Encounters:  10/12/24 118/74  02/29/24 (!) 169/84  02/17/24 126/88    Physical Exam Vitals and nursing note reviewed.  Constitutional:      General: She is not in acute distress.    Appearance: Normal appearance. She is well-developed.  HENT:     Head: Normocephalic and atraumatic.  Cardiovascular:     Rate and Rhythm: Normal rate and regular rhythm.  Pulmonary:     Effort: Pulmonary effort is normal.  No respiratory distress.     Breath sounds: No wheezing or rhonchi.  Musculoskeletal:     Cervical back: Normal range of motion.     Right lower leg: No edema.     Left lower leg: No edema.  Lymphadenopathy:     Cervical: No cervical adenopathy.  Skin:    General: Skin is warm and dry.     Findings: No rash.  Neurological:     General: No focal deficit present.     Mental Status: She is alert and oriented to person, place, and time.  Psychiatric:        Mood and Affect: Mood normal.        Behavior: Behavior normal.     Wt Readings from Last 3 Encounters:  10/12/24 262 lb (118.8 kg)  02/29/24 256 lb (116.1 kg)  02/17/24 262 lb (118.8 kg)    BP 118/74   Pulse 83   Ht 5' 4 (1.626 m)   Wt 262 lb (118.8 kg)   SpO2 98%   BMI 44.97 kg/m   Assessment and Plan:  Problem List Items Addressed This Visit       Unprioritized   Mixed hyperlipidemia (Chronic)   LDL is  Lab Results  Component Value Date   LDLCALC 102 (H) 01/25/2024    Current medication regimen is simvastatin . Goal LDL is < 100.       Relevant Medications   hydrochlorothiazide  (HYDRODIURIL ) 25 MG tablet   simvastatin  (ZOCOR ) 20 MG tablet   Localized edema (Chronic)   Fairly well controlled on hydrochlorothiazide . Limiting sodium some but eats out frequently. No chest pain, BP have been normal.      Relevant Medications   hydrochlorothiazide  (HYDRODIURIL ) 25 MG tablet   Gastroesophageal reflux disease - Primary (Chronic)   Currently taking  omeprazole  with minimal reflux symptoms. Patient denies red flag symptoms - no melena, weight loss, dysphagia. Will maintain current management.       Relevant Medications   omeprazole  (PRILOSEC) 20 MG capsule   Environmental and seasonal allergies   Relevant Medications   levocetirizine (XYZAL ) 5 MG tablet    Return in about 6 months (around 04/11/2025) for CPX Dr. Lemon.    Leita HILARIO Adie, MD [facility] [place]

## 2024-10-12 NOTE — Assessment & Plan Note (Signed)
 Fairly well controlled on hydrochlorothiazide . Limiting sodium some but eats out frequently. No chest pain, BP have been normal.

## 2024-10-12 NOTE — Assessment & Plan Note (Signed)
 LDL is  Lab Results  Component Value Date   LDLCALC 102 (H) 01/25/2024    Current medication regimen is simvastatin . Goal LDL is < 100.

## 2024-10-12 NOTE — Assessment & Plan Note (Signed)
 Currently taking omeprazole  with minimal reflux symptoms. Patient denies red flag symptoms - no melena, weight loss, dysphagia. Will maintain current management.

## 2024-10-19 ENCOUNTER — Telehealth: Payer: Self-pay

## 2024-10-19 ENCOUNTER — Other Ambulatory Visit (HOSPITAL_COMMUNITY): Payer: Self-pay

## 2024-10-19 ENCOUNTER — Telehealth: Payer: Self-pay | Admitting: Pharmacy Technician

## 2024-10-19 NOTE — Telephone Encounter (Signed)
 Pharmacy Patient Advocate Encounter   Received notification from Pt Calls Messages that prior authorization for LEVOCETIRIZINE 5 MG TAB is required/requested.   Insurance verification completed.   The patient is insured through [facility].   Per test claim:  OTC levocetirizine 5 mg tab is preferred by the insurance.  If suggested medication is appropriate, Please send in a new RX and discontinue this one. If not, please advise as to why it's not appropriate so that we may request a Prior Authorization. Please note, some preferred medications may still require a PA.  If the suggested medications have not been trialed and there are no contraindications to their use, the PA will not be submitted, as it will not be approved.   **Prescription strength is plan/benefit exclusion for her plan**

## 2024-10-19 NOTE — Telephone Encounter (Signed)
 Please review.  KP

## 2024-10-19 NOTE — Telephone Encounter (Signed)
 PA request has been Received. New Encounter has been or will be created for follow up. For additional info see Pharmacy Prior Auth telephone encounter from 10/19/24.

## 2024-10-19 NOTE — Telephone Encounter (Signed)
 Please complete PA for Levocertirizine 5MG .  [id number]  KP

## 2024-10-19 NOTE — Telephone Encounter (Signed)
Sent pt Mychart message.  KP 

## 2024-10-24 NOTE — Telephone Encounter (Signed)
 OTC levocetirizine 5 mg tab is preferred by the insurance.

## 2024-11-16 ENCOUNTER — Encounter: Payer: Self-pay | Admitting: Internal Medicine

## 2024-11-18 ENCOUNTER — Other Ambulatory Visit: Payer: Self-pay

## 2024-11-18 DIAGNOSIS — K219 Gastro-esophageal reflux disease without esophagitis: Secondary | ICD-10-CM

## 2024-11-18 MED ORDER — OMEPRAZOLE 40 MG PO CPDR: 40.0000 mg | DELAYED_RELEASE_CAPSULE | Freq: Every day | ORAL | 3 refills | Status: DC

## 2024-11-19 ENCOUNTER — Other Ambulatory Visit: Payer: Self-pay | Admitting: Internal Medicine

## 2024-11-19 DIAGNOSIS — K219 Gastro-esophageal reflux disease without esophagitis: Secondary | ICD-10-CM

## 2024-11-21 ENCOUNTER — Other Ambulatory Visit (HOSPITAL_COMMUNITY): Payer: Self-pay

## 2024-11-21 ENCOUNTER — Telehealth: Payer: Self-pay | Admitting: Pharmacy Technician

## 2024-11-21 ENCOUNTER — Telehealth: Payer: Self-pay

## 2024-11-21 NOTE — Telephone Encounter (Signed)
 Not covered by insurance.  KP

## 2024-11-21 NOTE — Telephone Encounter (Signed)
 Please complete PA for Omeprazole .  KEY: [id number]  KP

## 2024-11-21 NOTE — Telephone Encounter (Signed)
 PA request has been Received. New Encounter has been or will be created for follow up. For additional info see Pharmacy Prior Auth telephone encounter from 11/21/24.

## 2024-11-21 NOTE — Telephone Encounter (Signed)
 Pharmacy Patient Advocate Encounter   Received notification from Pt Calls Messages that prior authorization for Omeprazole  40 mg capsules is required/requested.   Insurance verification completed.   The patient is insured through [facility]   Per test claim: Per test claim, medication is not covered due to plan/benefit exclusion, PA not submitted at this time

## 2024-11-22 ENCOUNTER — Other Ambulatory Visit: Payer: Self-pay | Admitting: Internal Medicine

## 2024-11-22 DIAGNOSIS — N3281 Overactive bladder: Secondary | ICD-10-CM

## 2024-11-22 DIAGNOSIS — N3941 Urge incontinence: Secondary | ICD-10-CM

## 2024-11-22 DIAGNOSIS — R351 Nocturia: Secondary | ICD-10-CM

## 2024-11-22 DIAGNOSIS — R600 Localized edema: Secondary | ICD-10-CM

## 2024-11-22 DIAGNOSIS — E782 Mixed hyperlipidemia: Secondary | ICD-10-CM

## 2024-11-22 NOTE — Telephone Encounter (Unsigned)
 Copied from CRM #[id number]. Topic: Clinical - Medication Refill >> Nov 22, 2024  4:29 PM Joesph B wrote: Medication: simvastatin  (ZOCOR ) 20 MG tablet  hydrochlorothiazide  (HYDRODIURIL ) 25 MG tablet   oxybutynin  (DITROPAN -XL) 10 MG 24 hr tablet  Has the patient contacted their pharmacy? Yes (Agent: If no, request that the patient contact the pharmacy for the refill. If patient does not wish to contact the pharmacy document the reason why and proceed with request.) (Agent: If yes, when and what did the pharmacy advise?)  This is the patient's preferred pharmacy:  [facility] - [place], [place] - [address] [place] [address] Phone: [phone] Fax: [phone]    Is this the correct pharmacy for this prescription? Yes If no, delete pharmacy and type the correct one.   Has the prescription been filled recently? Yes  Is the patient out of the medication? Yes  Has the patient been seen for an appointment in the last year OR does the patient have an upcoming appointment? Yes  Can we respond through MyChart? Yes  Agent: Please be advised that Rx refills may take up to 3 business days. We ask that you follow-up with your pharmacy.

## 2024-11-22 NOTE — Telephone Encounter (Signed)
 Discontinued 10/12/24, dose change.  Requested Prescriptions  Pending Prescriptions Disp Refills   omeprazole  (PRILOSEC) 20 MG capsule 180 capsule 2    Sig: Take 1 capsule by mouth twice daily before a meal.     Gastroenterology: Proton Pump Inhibitors Passed - 11/22/2024  9:27 AM      Passed - Valid encounter within last 12 months    Recent Outpatient Visits           1 month ago Gastroesophageal reflux disease without esophagitis   [place] [facility] at [facility], Leita DEL, [place]   9 months ago Acute URI   [facility] Medicine at [facility], Toribio SQUIBB, [place]   10 months ago Annual physical exam   [facility] at [facility], Leita DEL, MD   10 months ago Other infective acute otitis externa of right ear   [place] [facility] at [facility], Selinda PARAS, MD       Future Appointments             In 3 months McGowan, [name] [facility] [place]

## 2024-11-24 MED ORDER — SIMVASTATIN 20 MG PO TABS: 20.0000 mg | ORAL_TABLET | Freq: Every day | ORAL | 0 refills | Status: AC

## 2024-11-24 NOTE — Telephone Encounter (Signed)
 Requested medications are due for refill today.  No - pt wants rx's sent to a different pharmacy  Requested medications are on the active medications list.  yes  Last refill. Varied  Future visit scheduled.   yes  Notes to clinic.  Labs are expired for 1 med, the other rx is signed by another provider. Please review.    Requested Prescriptions  Pending Prescriptions Disp Refills   hydrochlorothiazide  (HYDRODIURIL ) 25 MG tablet 90 tablet 1    Sig: Take 1 tablet (25 mg total) by mouth daily.     Cardiovascular: Diuretics - Thiazide Failed - 11/24/2024  9:56 AM      Failed - Cr in normal range and within 180 days    Creatinine, Ser  Date Value Ref Range Status  01/25/2024 0.94 0.44 - 1.00 mg/dL Final         Failed - K in normal range and within 180 days    Potassium  Date Value Ref Range Status  01/25/2024 3.9 3.5 - 5.1 mmol/L Final         Failed - Na in normal range and within 180 days    Sodium  Date Value Ref Range Status  01/25/2024 139 135 - 145 mmol/L Final  01/23/2023 146 (H) 134 - 144 mmol/L Final         Passed - Last BP in normal range    BP Readings from Last 1 Encounters:  10/12/24 118/74         Passed - Valid encounter within last 6 months    Recent Outpatient Visits           1 month ago Gastroesophageal reflux disease without esophagitis   [place] [facility] at [facility], Leita DEL, [place]   9 months ago Acute URI   [facility] Medicine at [facility], Toribio SQUIBB, [place]   10 months ago Annual physical exam   [facility] Medicine at [facility], Leita DEL, [place]   10 months ago Other infective acute otitis externa of right ear   [place] [facility] at [facility], Selinda PARAS, [place]       Future Appointments             In 3 months McGowan, Clotilda LABOR, PA-C [facility] [place]             oxybutynin   (DITROPAN -XL) 10 MG 24 hr tablet 90 tablet 3    Sig: Take 1 tablet (10 mg total) by mouth daily.     Urology:  Bladder Agents Passed - 11/24/2024  9:56 AM      Passed - Valid encounter within last 12 months    Recent Outpatient Visits           1 month ago Gastroesophageal reflux disease without esophagitis   [place] [facility] at [facility], Leita DEL, MD   9 months ago Acute URI   [facility] at [facility], Toribio SQUIBB, [place]   10 months ago Annual physical exam   [facility] at [facility], Leita DEL, [place]   10 months ago Other infective acute otitis externa of right ear   [facility] at [facility], Selinda PARAS, MD       Future Appointments  In 3 months McGowan, [name] [facility] [place]            Signed Prescriptions Disp Refills   simvastatin  (ZOCOR ) 20 MG tablet 90 tablet 0    Sig: Take 1 tablet (20 mg total) by mouth daily.     Cardiovascular:  Antilipid - Statins Failed - 11/24/2024  9:56 AM      Failed - Lipid Panel in normal range within the last 12 months    Cholesterol, Total  Date Value Ref Range Status  01/23/2023 164 100 - 199 mg/dL Final   Cholesterol  Date Value Ref Range Status  01/25/2024 186 0 - 200 mg/dL Final   LDL Chol Calc (NIH)  Date Value Ref Range Status  01/23/2023 87 0 - 99 mg/dL Final   LDL Cholesterol  Date Value Ref Range Status  01/25/2024 102 (H) 0 - 99 mg/dL Final    Comment:           Total Cholesterol/HDL:CHD Risk Coronary Heart Disease Risk Table                     Men   Women  1/2 Average Risk   3.4   3.3  Average Risk       5.0   4.4  2 X Average Risk   9.6   7.1  3 X Average Risk  23.4   11.0        Use the calculated Patient Ratio above and the CHD Risk Table to determine the patient's CHD Risk.        ATP III CLASSIFICATION  (LDL):  <100     mg/dL   Optimal  899-870  mg/dL   Near or Above                    Optimal  130-159  mg/dL   Borderline  839-810  mg/dL   High  >809     mg/dL   Very High Performed at [facility], [address] [place], [place] [address]    HDL  Date Value Ref Range Status  01/25/2024 64 >40 mg/dL Final  96/98/7975 61 >60 mg/dL Final   Triglycerides  Date Value Ref Range Status  01/25/2024 98 <150 mg/dL Final         Passed - Patient is not pregnant      Passed - Valid encounter within last 12 months    Recent Outpatient Visits           1 month ago Gastroesophageal reflux disease without esophagitis   [place] [facility] at [facility], Leita DEL, [place]   9 months ago Acute URI   [facility] at [facility], Toribio SQUIBB, [place]   10 months ago Annual physical exam   [facility] Sports Medicine at [facility], Leita DEL, [place]   10 months ago Other infective acute otitis externa of right ear   [place] [facility] at [facility], Selinda PARAS, MD       Future Appointments             In 3 months McGowan, [name] [facility] [place]

## 2024-11-24 NOTE — Telephone Encounter (Signed)
 Requested Prescriptions  Pending Prescriptions Disp Refills   simvastatin  (ZOCOR ) 20 MG tablet 90 tablet 1    Sig: Take 1 tablet (20 mg total) by mouth daily.     Cardiovascular:  Antilipid - Statins Failed - 11/24/2024  9:56 AM      Failed - Lipid Panel in normal range within the last 12 months    Cholesterol, Total  Date Value Ref Range Status  01/23/2023 164 100 - 199 mg/dL Final   Cholesterol  Date Value Ref Range Status  01/25/2024 186 0 - 200 mg/dL Final   LDL Chol Calc (NIH)  Date Value Ref Range Status  01/23/2023 87 0 - 99 mg/dL Final   LDL Cholesterol  Date Value Ref Range Status  01/25/2024 102 (H) 0 - 99 mg/dL Final    Comment:           Total Cholesterol/HDL:CHD Risk Coronary Heart Disease Risk Table                     Men   Women  1/2 Average Risk   3.4   3.3  Average Risk       5.0   4.4  2 X Average Risk   9.6   7.1  3 X Average Risk  23.4   11.0        Use the calculated Patient Ratio above and the CHD Risk Table to determine the patient's CHD Risk.        ATP III CLASSIFICATION (LDL):  <100     mg/dL   Optimal  899-870  mg/dL   Near or Above                    Optimal  130-159  mg/dL   Borderline  839-810  mg/dL   High  >809     mg/dL   Very High Performed at [facility], [address] [place], [place] [address]    HDL  Date Value Ref Range Status  01/25/2024 64 >40 mg/dL Final  96/98/7975 61 >60 mg/dL Final   Triglycerides  Date Value Ref Range Status  01/25/2024 98 <150 mg/dL Final         Passed - Patient is not pregnant      Passed - Valid encounter within last 12 months    Recent Outpatient Visits           1 month ago Gastroesophageal reflux disease without esophagitis   [place] [facility], Leita DEL, [place]   9 months ago Acute URI   [facility] at [facility], Toribio SQUIBB, [place]   10 months ago Annual physical exam   [facility] at [facility], Leita DEL, [place]   10 months ago Other infective acute otitis externa of right ear   [place] [facility] at [facility], Selinda PARAS, [place]       Future Appointments             In 3 months McGowan, Clotilda LABOR, PA-C [facility] [place]             hydrochlorothiazide  (HYDRODIURIL ) 25 MG tablet 90 tablet 1    Sig: Take 1 tablet (25 mg total) by mouth daily.     Cardiovascular: Diuretics - Thiazide Failed - 11/24/2024  9:56 AM  Failed - Cr in normal range and within 180 days    Creatinine, Ser  Date Value Ref Range Status  01/25/2024 0.94 0.44 - 1.00 mg/dL Final         Failed - K in normal range and within 180 days    Potassium  Date Value Ref Range Status  01/25/2024 3.9 3.5 - 5.1 mmol/L Final         Failed - Na in normal range and within 180 days    Sodium  Date Value Ref Range Status  01/25/2024 139 135 - 145 mmol/L Final  01/23/2023 146 (H) 134 - 144 mmol/L Final         Passed - Last BP in normal range    BP Readings from Last 1 Encounters:  10/12/24 118/74         Passed - Valid encounter within last 6 months    Recent Outpatient Visits           1 month ago Gastroesophageal reflux disease without esophagitis   [place] [facility], Leita DEL, [place]   9 months ago Acute URI   [facility] at [facility], Toribio SQUIBB, [place]   10 months ago Annual physical exam   [facility] at [facility], Leita DEL, [place]   10 months ago Other infective acute otitis externa of right ear   [place] [facility] at [facility], Selinda PARAS, MD       Future Appointments             In 3 months McGowan, Clotilda LABOR, PA-C [facility] [place]             oxybutynin  (DITROPAN -XL) 10 MG 24 hr tablet 90 tablet 3     Sig: Take 1 tablet (10 mg total) by mouth daily.     Urology:  Bladder Agents Passed - 11/24/2024  9:56 AM      Passed - Valid encounter within last 12 months    Recent Outpatient Visits           1 month ago Gastroesophageal reflux disease without esophagitis   [place] [facility] at [facility], Leita DEL, MD   9 months ago Acute URI   [facility] at [facility], Toribio SQUIBB, [place]   10 months ago Annual physical exam   [facility] at [facility], Leita DEL, [place]   10 months ago Other infective acute otitis externa of right ear   [place] [facility] at [facility], Selinda PARAS, MD       Future Appointments             In 3 months McGowan, Clotilda LABOR [facility] [place]

## 2024-11-25 ENCOUNTER — Other Ambulatory Visit: Payer: Self-pay

## 2024-11-25 DIAGNOSIS — K219 Gastro-esophageal reflux disease without esophagitis: Secondary | ICD-10-CM

## 2024-11-25 MED ORDER — HYDROCHLOROTHIAZIDE 25 MG PO TABS: 25.0000 mg | ORAL_TABLET | Freq: Every day | ORAL | 0 refills | Status: AC

## 2024-11-25 MED ORDER — OMEPRAZOLE 40 MG PO CPDR: 40.0000 mg | DELAYED_RELEASE_CAPSULE | Freq: Every day | ORAL | 3 refills | Status: AC

## 2024-11-25 MED ORDER — OXYBUTYNIN CHLORIDE ER 10 MG PO TB24: 10.0000 mg | ORAL_TABLET | Freq: Every day | ORAL | 0 refills | Status: AC

## 2024-11-30 ENCOUNTER — Other Ambulatory Visit: Payer: Self-pay

## 2024-11-30 ENCOUNTER — Encounter: Payer: Self-pay | Admitting: Student

## 2024-11-30 DIAGNOSIS — J3089 Other allergic rhinitis: Secondary | ICD-10-CM

## 2024-11-30 MED ORDER — LEVOCETIRIZINE DIHYDROCHLORIDE 5 MG PO TABS: 5.0000 mg | ORAL_TABLET | Freq: Every evening | ORAL | 1 refills | Status: AC

## 2024-12-05 ENCOUNTER — Other Ambulatory Visit: Payer: Self-pay | Admitting: Urology

## 2024-12-05 DIAGNOSIS — N3281 Overactive bladder: Secondary | ICD-10-CM

## 2024-12-05 DIAGNOSIS — R351 Nocturia: Secondary | ICD-10-CM

## 2024-12-05 DIAGNOSIS — N3941 Urge incontinence: Secondary | ICD-10-CM

## 2025-02-06 ENCOUNTER — Encounter: Admitting: Student

## 2025-03-06 ENCOUNTER — Ambulatory Visit: Admitting: Urology
